# Patient Record
Sex: Female | Born: 1945
Health system: Southern US, Community
[De-identification: ages and names within clinical notes are randomized; demographics above are authoritative.]

## PROBLEM LIST (undated history)

## (undated) DIAGNOSIS — F419 Anxiety disorder, unspecified: Secondary | ICD-10-CM

## (undated) DIAGNOSIS — E7800 Pure hypercholesterolemia, unspecified: Secondary | ICD-10-CM

## (undated) DIAGNOSIS — I10 Essential (primary) hypertension: Secondary | ICD-10-CM

## (undated) DIAGNOSIS — R011 Cardiac murmur, unspecified: Secondary | ICD-10-CM

## (undated) DIAGNOSIS — E119 Type 2 diabetes mellitus without complications: Secondary | ICD-10-CM

## (undated) DIAGNOSIS — R7303 Prediabetes: Secondary | ICD-10-CM

## (undated) DIAGNOSIS — R002 Palpitations: Secondary | ICD-10-CM

## (undated) DIAGNOSIS — K589 Irritable bowel syndrome without diarrhea: Secondary | ICD-10-CM

## (undated) DIAGNOSIS — K219 Gastro-esophageal reflux disease without esophagitis: Secondary | ICD-10-CM

## (undated) HISTORY — DX: Irritable bowel syndrome, unspecified: K58.9

## (undated) HISTORY — DX: Cardiac murmur, unspecified: R01.1

## (undated) HISTORY — DX: Pure hypercholesterolemia, unspecified: E78.00

## (undated) HISTORY — DX: Palpitations: R00.2

## (undated) HISTORY — DX: Anxiety disorder, unspecified: F41.9

## (undated) HISTORY — PX: BREAST SURGERY: SHX581

## (undated) HISTORY — PX: CHOLECYSTECTOMY: SHX55

## (undated) HISTORY — DX: Type 2 diabetes mellitus without complications: E11.9

## (undated) HISTORY — DX: Essential (primary) hypertension: I10

## (undated) HISTORY — PX: TUBAL LIGATION: SHX77

## (undated) HISTORY — PX: TONSILLECTOMY: SHX5217

## (undated) HISTORY — PX: HERNIA REPAIR: SHX51

## (undated) HISTORY — DX: Prediabetes: R73.03

## (undated) HISTORY — PX: COLONOSCOPY: SHX174

## (undated) HISTORY — DX: Gastro-esophageal reflux disease without esophagitis: K21.9

---

## 1990-07-30 HISTORY — PX: ABDOMINAL HYSTERECTOMY: SHX81

## 1994-07-30 HISTORY — PX: BILATERAL SALPINGOOPHORECTOMY: SHX1223

## 1998-01-11 ENCOUNTER — Other Ambulatory Visit: Admission: RE | Admit: 1998-01-11 | Discharge: 1998-01-11 | Payer: Self-pay | Admitting: *Deleted

## 1998-04-12 ENCOUNTER — Inpatient Hospital Stay (HOSPITAL_COMMUNITY): Admission: RE | Admit: 1998-04-12 | Discharge: 1998-04-13 | Payer: Self-pay | Admitting: *Deleted

## 1999-01-23 ENCOUNTER — Other Ambulatory Visit: Admission: RE | Admit: 1999-01-23 | Discharge: 1999-01-23 | Payer: Self-pay | Admitting: *Deleted

## 2000-01-22 ENCOUNTER — Other Ambulatory Visit: Admission: RE | Admit: 2000-01-22 | Discharge: 2000-01-22 | Payer: Self-pay | Admitting: *Deleted

## 2000-03-21 ENCOUNTER — Encounter: Payer: Self-pay | Admitting: Surgery

## 2000-03-21 ENCOUNTER — Encounter: Admission: RE | Admit: 2000-03-21 | Discharge: 2000-03-21 | Payer: Self-pay | Admitting: Surgery

## 2000-03-27 ENCOUNTER — Encounter (INDEPENDENT_AMBULATORY_CARE_PROVIDER_SITE_OTHER): Payer: Self-pay | Admitting: Specialist

## 2000-03-27 ENCOUNTER — Observation Stay (HOSPITAL_COMMUNITY): Admission: RE | Admit: 2000-03-27 | Discharge: 2000-03-28 | Payer: Self-pay | Admitting: Surgery

## 2000-03-27 ENCOUNTER — Encounter: Payer: Self-pay | Admitting: Surgery

## 2002-07-13 ENCOUNTER — Other Ambulatory Visit: Admission: RE | Admit: 2002-07-13 | Discharge: 2002-07-13 | Payer: Self-pay | Admitting: *Deleted

## 2003-05-20 ENCOUNTER — Encounter: Payer: Self-pay | Admitting: Internal Medicine

## 2003-10-25 ENCOUNTER — Other Ambulatory Visit: Admission: RE | Admit: 2003-10-25 | Discharge: 2003-10-25 | Payer: Self-pay | Admitting: *Deleted

## 2004-07-13 ENCOUNTER — Ambulatory Visit: Payer: Self-pay | Admitting: Internal Medicine

## 2004-07-18 ENCOUNTER — Ambulatory Visit: Payer: Self-pay | Admitting: Internal Medicine

## 2004-07-27 ENCOUNTER — Ambulatory Visit: Payer: Self-pay | Admitting: Internal Medicine

## 2004-08-11 ENCOUNTER — Ambulatory Visit: Payer: Self-pay | Admitting: Internal Medicine

## 2004-11-28 ENCOUNTER — Ambulatory Visit: Payer: Self-pay | Admitting: Internal Medicine

## 2005-01-09 ENCOUNTER — Ambulatory Visit: Payer: Self-pay | Admitting: Internal Medicine

## 2005-09-19 ENCOUNTER — Ambulatory Visit: Payer: Self-pay | Admitting: Internal Medicine

## 2006-07-05 ENCOUNTER — Ambulatory Visit: Payer: Self-pay | Admitting: Internal Medicine

## 2006-07-11 ENCOUNTER — Ambulatory Visit: Payer: Self-pay | Admitting: Internal Medicine

## 2007-02-04 ENCOUNTER — Telehealth (INDEPENDENT_AMBULATORY_CARE_PROVIDER_SITE_OTHER): Payer: Self-pay | Admitting: *Deleted

## 2007-03-24 ENCOUNTER — Ambulatory Visit: Payer: Self-pay | Admitting: Internal Medicine

## 2007-03-24 DIAGNOSIS — I10 Essential (primary) hypertension: Secondary | ICD-10-CM | POA: Insufficient documentation

## 2007-03-24 DIAGNOSIS — E785 Hyperlipidemia, unspecified: Secondary | ICD-10-CM | POA: Insufficient documentation

## 2007-03-31 LAB — CONVERTED CEMR LAB
ALT: 60 units/L — ABNORMAL HIGH (ref 0–35)
AST: 69 units/L — ABNORMAL HIGH (ref 0–37)
Albumin: 3.9 g/dL (ref 3.5–5.2)
Alkaline Phosphatase: 71 units/L (ref 39–117)
BUN: 10 mg/dL (ref 6–23)
Basophils Absolute: 0 10*3/uL (ref 0.0–0.1)
Basophils Relative: 0.3 % (ref 0.0–1.0)
Bilirubin, Direct: 0.1 mg/dL (ref 0.0–0.3)
CO2: 28 meq/L (ref 19–32)
Calcium: 9.3 mg/dL (ref 8.4–10.5)
Chloride: 108 meq/L (ref 96–112)
Cholesterol: 126 mg/dL (ref 0–200)
Creatinine, Ser: 0.9 mg/dL (ref 0.4–1.2)
Eosinophils Absolute: 0.1 10*3/uL (ref 0.0–0.6)
Eosinophils Relative: 1.7 % (ref 0.0–5.0)
GFR calc Af Amer: 82 mL/min
GFR calc non Af Amer: 68 mL/min
Glucose, Bld: 108 mg/dL — ABNORMAL HIGH (ref 70–99)
HCT: 43 % (ref 36.0–46.0)
HDL: 46.3 mg/dL (ref >39.0)
Hemoglobin: 14.8 g/dL (ref 12.0–15.0)
Hgb A1c MFr Bld: 6.1 % — ABNORMAL HIGH (ref 4.6–6.0)
LDL Cholesterol: 61 mg/dL (ref 0–99)
Lymphocytes Relative: 29.7 % (ref 12.0–46.0)
MCHC: 34.4 g/dL (ref 30.0–36.0)
MCV: 89.9 fL (ref 78.0–100.0)
Monocytes Absolute: 0.5 10*3/uL (ref 0.2–0.7)
Monocytes Relative: 6.5 % (ref 3.0–11.0)
Neutro Abs: 5.1 10*3/uL (ref 1.4–7.7)
Neutrophils Relative %: 61.8 % (ref 43.0–77.0)
Platelets: 346 10*3/uL (ref 150–400)
Potassium: 4 meq/L (ref 3.5–5.1)
RBC: 4.78 M/uL (ref 3.87–5.11)
RDW: 13 % (ref 11.5–14.6)
Sodium: 143 meq/L (ref 135–145)
TSH: 0.4 microintl units/mL (ref 0.35–5.50)
Total Bilirubin: 1.8 mg/dL — ABNORMAL HIGH (ref 0.3–1.2)
Total CHOL/HDL Ratio: 2.7
Total Protein: 7 g/dL (ref 6.0–8.3)
Triglycerides: 93 mg/dL (ref 0–149)
VLDL: 19 mg/dL (ref 0–40)
WBC: 8.1 10*3/uL (ref 4.5–10.5)

## 2007-04-01 ENCOUNTER — Encounter (INDEPENDENT_AMBULATORY_CARE_PROVIDER_SITE_OTHER): Payer: Self-pay | Admitting: *Deleted

## 2007-04-01 ENCOUNTER — Encounter: Payer: Self-pay | Admitting: Internal Medicine

## 2007-04-14 ENCOUNTER — Encounter (INDEPENDENT_AMBULATORY_CARE_PROVIDER_SITE_OTHER): Payer: Self-pay | Admitting: *Deleted

## 2007-04-14 ENCOUNTER — Ambulatory Visit: Payer: Self-pay | Admitting: Internal Medicine

## 2007-04-15 ENCOUNTER — Encounter (INDEPENDENT_AMBULATORY_CARE_PROVIDER_SITE_OTHER): Payer: Self-pay | Admitting: *Deleted

## 2007-11-18 ENCOUNTER — Telehealth (INDEPENDENT_AMBULATORY_CARE_PROVIDER_SITE_OTHER): Payer: Self-pay | Admitting: *Deleted

## 2008-03-30 ENCOUNTER — Telehealth (INDEPENDENT_AMBULATORY_CARE_PROVIDER_SITE_OTHER): Payer: Self-pay | Admitting: *Deleted

## 2008-04-20 ENCOUNTER — Ambulatory Visit: Payer: Self-pay | Admitting: Internal Medicine

## 2008-04-21 ENCOUNTER — Encounter (INDEPENDENT_AMBULATORY_CARE_PROVIDER_SITE_OTHER): Payer: Self-pay | Admitting: *Deleted

## 2008-04-21 ENCOUNTER — Telehealth (INDEPENDENT_AMBULATORY_CARE_PROVIDER_SITE_OTHER): Payer: Self-pay | Admitting: *Deleted

## 2008-04-22 ENCOUNTER — Encounter (INDEPENDENT_AMBULATORY_CARE_PROVIDER_SITE_OTHER): Payer: Self-pay | Admitting: *Deleted

## 2008-08-24 ENCOUNTER — Ambulatory Visit: Payer: Self-pay | Admitting: Internal Medicine

## 2008-08-29 LAB — CONVERTED CEMR LAB
ALT: 38 units/L — ABNORMAL HIGH (ref 0–35)
AST: 36 units/L (ref 0–37)
Albumin: 3.5 g/dL (ref 3.5–5.2)
Alkaline Phosphatase: 60 units/L (ref 39–117)
Bilirubin, Direct: 0.1 mg/dL (ref 0.0–0.3)
Hgb A1c MFr Bld: 6.2 % — ABNORMAL HIGH (ref 4.6–6.0)
Total Bilirubin: 1.1 mg/dL (ref 0.3–1.2)
Total Protein: 7 g/dL (ref 6.0–8.3)

## 2008-08-31 ENCOUNTER — Encounter: Payer: Self-pay | Admitting: Internal Medicine

## 2009-02-08 ENCOUNTER — Telehealth (INDEPENDENT_AMBULATORY_CARE_PROVIDER_SITE_OTHER): Payer: Self-pay | Admitting: *Deleted

## 2009-03-23 ENCOUNTER — Ambulatory Visit: Payer: Self-pay | Admitting: Internal Medicine

## 2009-03-30 ENCOUNTER — Encounter: Payer: Self-pay | Admitting: Internal Medicine

## 2009-04-05 LAB — CONVERTED CEMR LAB
ALT: 78 units/L — ABNORMAL HIGH (ref 0–35)
AST: 75 units/L — ABNORMAL HIGH (ref 0–37)
Hgb A1c MFr Bld: 6.2 % (ref 4.6–6.5)

## 2009-04-21 ENCOUNTER — Ambulatory Visit: Payer: Self-pay | Admitting: Internal Medicine

## 2009-04-21 DIAGNOSIS — R740 Nonspecific elevation of levels of transaminase and lactic acid dehydrogenase [LDH]: Secondary | ICD-10-CM

## 2009-04-21 DIAGNOSIS — N959 Unspecified menopausal and perimenopausal disorder: Secondary | ICD-10-CM | POA: Insufficient documentation

## 2009-04-21 DIAGNOSIS — E119 Type 2 diabetes mellitus without complications: Secondary | ICD-10-CM | POA: Insufficient documentation

## 2009-04-21 DIAGNOSIS — R7401 Elevation of levels of liver transaminase levels: Secondary | ICD-10-CM | POA: Insufficient documentation

## 2009-04-26 ENCOUNTER — Telehealth (INDEPENDENT_AMBULATORY_CARE_PROVIDER_SITE_OTHER): Payer: Self-pay | Admitting: *Deleted

## 2009-04-26 ENCOUNTER — Encounter (INDEPENDENT_AMBULATORY_CARE_PROVIDER_SITE_OTHER): Payer: Self-pay | Admitting: *Deleted

## 2009-05-18 ENCOUNTER — Telehealth (INDEPENDENT_AMBULATORY_CARE_PROVIDER_SITE_OTHER): Payer: Self-pay | Admitting: *Deleted

## 2009-06-01 ENCOUNTER — Ambulatory Visit: Payer: Self-pay | Admitting: Internal Medicine

## 2009-06-06 ENCOUNTER — Encounter (INDEPENDENT_AMBULATORY_CARE_PROVIDER_SITE_OTHER): Payer: Self-pay | Admitting: *Deleted

## 2009-06-06 LAB — CONVERTED CEMR LAB
ALT: 53 units/L — ABNORMAL HIGH (ref 0–35)
AST: 52 units/L — ABNORMAL HIGH (ref 0–37)
Albumin: 3.6 g/dL (ref 3.5–5.2)
Alkaline Phosphatase: 64 units/L (ref 39–117)
Bilirubin, Direct: 0 mg/dL (ref 0.0–0.3)
Total Bilirubin: 1.2 mg/dL (ref 0.3–1.2)
Total Protein: 7.3 g/dL (ref 6.0–8.3)

## 2009-06-10 ENCOUNTER — Ambulatory Visit: Payer: Self-pay | Admitting: Internal Medicine

## 2009-06-30 ENCOUNTER — Encounter: Payer: Self-pay | Admitting: Internal Medicine

## 2010-04-04 ENCOUNTER — Encounter: Payer: Self-pay | Admitting: Internal Medicine

## 2010-04-26 ENCOUNTER — Ambulatory Visit: Payer: Self-pay | Admitting: Internal Medicine

## 2010-04-26 ENCOUNTER — Encounter: Payer: Self-pay | Admitting: Internal Medicine

## 2010-04-26 DIAGNOSIS — R7309 Other abnormal glucose: Secondary | ICD-10-CM | POA: Insufficient documentation

## 2010-04-26 DIAGNOSIS — K219 Gastro-esophageal reflux disease without esophagitis: Secondary | ICD-10-CM | POA: Insufficient documentation

## 2010-04-26 DIAGNOSIS — F411 Generalized anxiety disorder: Secondary | ICD-10-CM | POA: Insufficient documentation

## 2010-05-01 LAB — CONVERTED CEMR LAB
ALT: 63 units/L — ABNORMAL HIGH (ref 0–35)
AST: 63 units/L — ABNORMAL HIGH (ref 0–37)
Albumin: 3.8 g/dL (ref 3.5–5.2)
Eosinophils Relative: 2.6 % (ref 0.0–5.0)
GFR calc non Af Amer: 63.21 mL/min (ref >60)
HCT: 43.1 % (ref 36.0–46.0)
HDL: 57 mg/dL (ref >39.00)
Hemoglobin: 14.7 g/dL (ref 12.0–15.0)
Lymphs Abs: 3 10*3/uL (ref 0.7–4.0)
Microalb Creat Ratio: 0.6 mg/g (ref 0.0–30.0)
Monocytes Relative: 6.5 % (ref 3.0–12.0)
Neutro Abs: 4.8 10*3/uL (ref 1.4–7.7)
Potassium: 5.1 meq/L (ref 3.5–5.1)
Sodium: 142 meq/L (ref 135–145)
TSH: 0.23 microintl units/mL — ABNORMAL LOW (ref 0.35–5.50)
Total Bilirubin: 1.4 mg/dL — ABNORMAL HIGH (ref 0.3–1.2)
VLDL: 27.2 mg/dL (ref 0.0–40.0)
WBC: 8.6 10*3/uL (ref 4.5–10.5)

## 2010-08-23 ENCOUNTER — Ambulatory Visit
Admission: RE | Admit: 2010-08-23 | Discharge: 2010-08-23 | Payer: Self-pay | Source: Home / Self Care | Attending: Internal Medicine | Admitting: Internal Medicine

## 2010-08-23 ENCOUNTER — Encounter: Payer: Self-pay | Admitting: Internal Medicine

## 2010-08-27 LAB — CONVERTED CEMR LAB
ALT: 46 units/L — ABNORMAL HIGH (ref 0–35)
ALT: 84 units/L — ABNORMAL HIGH (ref 0–35)
AST: 101 units/L — ABNORMAL HIGH (ref 0–37)
AST: 47 units/L — ABNORMAL HIGH (ref 0–37)
Albumin: 3.9 g/dL (ref 3.5–5.2)
Albumin: 3.9 g/dL (ref 3.5–5.2)
Alkaline Phosphatase: 66 units/L (ref 39–117)
Alkaline Phosphatase: 66 units/L (ref 39–117)
BUN: 11 mg/dL (ref 6–23)
BUN: 8 mg/dL (ref 6–23)
Basophils Absolute: 0.1 10*3/uL (ref 0.0–0.1)
Basophils Absolute: 0.1 10*3/uL (ref 0.0–0.1)
Basophils Relative: 0.6 % (ref 0.0–3.0)
Basophils Relative: 0.9 % (ref 0.0–3.0)
Bilirubin, Direct: 0.1 mg/dL (ref 0.0–0.3)
Bilirubin, Direct: 0.1 mg/dL (ref 0.0–0.3)
CO2: 29 meq/L (ref 19–32)
CO2: 30 meq/L (ref 19–32)
Calcium: 10 mg/dL (ref 8.4–10.5)
Calcium: 9.9 mg/dL (ref 8.4–10.5)
Chloride: 106 meq/L (ref 96–112)
Chloride: 108 meq/L (ref 96–112)
Cholesterol, target level: 200 mg/dL
Cholesterol: 181 mg/dL (ref 0–200)
Creatinine, Ser: 0.9 mg/dL (ref 0.4–1.2)
Creatinine, Ser: 1 mg/dL (ref 0.4–1.2)
Creatinine,U: 50.5 mg/dL
Eosinophils Absolute: 0.2 10*3/uL (ref 0.0–0.7)
Eosinophils Absolute: 0.2 10*3/uL (ref 0.0–0.7)
Eosinophils Relative: 2.1 % (ref 0.0–5.0)
Eosinophils Relative: 2.3 % (ref 0.0–5.0)
GFR calc Af Amer: 73 mL/min
GFR calc non Af Amer: 60 mL/min
GFR calc non Af Amer: 67.5 mL/min (ref >60)
Glucose, Bld: 100 mg/dL — ABNORMAL HIGH (ref 70–99)
Glucose, Bld: 92 mg/dL (ref 70–99)
HCT: 43.1 % (ref 36.0–46.0)
HCT: 43.3 % (ref 36.0–46.0)
HDL goal, serum: 50 mg/dL
HDL: 51.4 mg/dL (ref >39.0)
Hemoglobin: 14.6 g/dL (ref 12.0–15.0)
Hemoglobin: 14.9 g/dL (ref 12.0–15.0)
Hgb A1c MFr Bld: 6.2 % (ref 4.6–6.5)
Hgb A1c MFr Bld: 6.3 % — ABNORMAL HIGH (ref 4.6–6.0)
LDL Cholesterol: 98 mg/dL (ref 0–99)
LDL Goal: 110 mg/dL
Lymphocytes Relative: 28.9 % (ref 12.0–46.0)
Lymphocytes Relative: 33.7 % (ref 12.0–46.0)
Lymphs Abs: 2.6 10*3/uL (ref 0.7–4.0)
MCHC: 33.8 g/dL (ref 30.0–36.0)
MCHC: 34.5 g/dL (ref 30.0–36.0)
MCV: 91.7 fL (ref 78.0–100.0)
MCV: 92.6 fL (ref 78.0–100.0)
Microalb Creat Ratio: 4 mg/g (ref 0.0–30.0)
Microalb, Ur: 0.2 mg/dL (ref 0.0–1.9)
Monocytes Absolute: 0.4 10*3/uL (ref 0.1–1.0)
Monocytes Absolute: 0.6 10*3/uL (ref 0.1–1.0)
Monocytes Relative: 5.9 % (ref 3.0–12.0)
Monocytes Relative: 6.4 % (ref 3.0–12.0)
Neutro Abs: 4.3 10*3/uL (ref 1.4–7.7)
Neutro Abs: 5.5 10*3/uL (ref 1.4–7.7)
Neutrophils Relative %: 57.2 % (ref 43.0–77.0)
Neutrophils Relative %: 62 % (ref 43.0–77.0)
Platelets: 297 10*3/uL (ref 150.0–400.0)
Platelets: 321 10*3/uL (ref 150–400)
Potassium: 4.1 meq/L (ref 3.5–5.1)
Potassium: 4.6 meq/L (ref 3.5–5.1)
RBC: 4.65 M/uL (ref 3.87–5.11)
RBC: 4.72 M/uL (ref 3.87–5.11)
RDW: 12.9 % (ref 11.5–14.6)
RDW: 13 % (ref 11.5–14.6)
Sodium: 141 meq/L (ref 135–145)
Sodium: 141 meq/L (ref 135–145)
TSH: 0.63 microintl units/mL (ref 0.35–5.50)
TSH: 0.67 microintl units/mL (ref 0.35–5.50)
Total Bilirubin: 1.4 mg/dL — ABNORMAL HIGH (ref 0.3–1.2)
Total Bilirubin: 1.7 mg/dL — ABNORMAL HIGH (ref 0.3–1.2)
Total CHOL/HDL Ratio: 3.5
Total Protein: 7.1 g/dL (ref 6.0–8.3)
Total Protein: 7.2 g/dL (ref 6.0–8.3)
Triglycerides: 160 mg/dL — ABNORMAL HIGH (ref 0–149)
VLDL: 32 mg/dL (ref 0–40)
Vit D, 25-Hydroxy: 18 ng/mL — ABNORMAL LOW (ref 30–89)
WBC: 7.5 10*3/uL (ref 4.5–10.5)
WBC: 9 10*3/uL (ref 4.5–10.5)

## 2010-08-28 LAB — CONVERTED CEMR LAB
ALT: 26 units/L (ref 0–35)
AST: 29 units/L (ref 0–37)
Free T4: 1.13 ng/dL (ref 0.80–1.80)
Hgb A1c MFr Bld: 6.1 % — ABNORMAL HIGH (ref <5.7)

## 2010-08-29 ENCOUNTER — Telehealth: Payer: Self-pay | Admitting: Internal Medicine

## 2010-08-29 ENCOUNTER — Ambulatory Visit
Admission: RE | Admit: 2010-08-29 | Discharge: 2010-08-29 | Payer: Self-pay | Source: Home / Self Care | Attending: Internal Medicine | Admitting: Internal Medicine

## 2010-08-29 DIAGNOSIS — N6459 Other signs and symptoms in breast: Secondary | ICD-10-CM | POA: Insufficient documentation

## 2010-08-31 NOTE — Assessment & Plan Note (Signed)
Summary: CPX AND FASTING LABS///SPH   Vital Signs:  Patient profile:   65 year old female Height:      64.25 inches Weight:      172.4 pounds BMI:     29.47 Temp:     98.2 degrees F oral Pulse rate:   56 / minute Resp:     14 per minute BP sitting:   124 / 80  (left arm) Cuff size:   large  Vitals Entered By: Shonna Chock CMA (April 26, 2010 11:03 AM)    History of Present Illness:      Courtney Russell is here for a physical; she has had increased stress related to family health issues. Hypertension Follow-Up      This is a 65 year old woman who also  presents for Hypertension follow-up.  The patient denies lightheadedness, urinary frequency, headaches, and fatigue.  Associated symptoms include pedal edema.  The patient denies the following associated symptoms: chest pain, chest pressure, exercise intolerance, dyspnea, palpitations, syncope, and leg edema.  Compliance with medications (by patient report) has been near 100%.  The patient reports that dietary compliance has been fair.  The patient reports exercising occasionally.  Adjunctive measures currently used by the patient include  modiified salt restriction( no added salt @ table).       The patient also  has ERD.  The patient reports acid reflux, but denies sour taste in mouth, epigastric pain, trouble swallowing, weight loss, and weight gain.  The patient denies the following alarm features: melena, dysphagia, hematemesis, and vomiting.  Symptoms are worse with spicy foods, caffeine, & chocolate.  The patient has found the following treatments to be effective: a PPI & as needed TUMS.   Current Medications (verified): 1)  Clonidine Hcl 0.2 Mg  Tabs (Clonidine Hcl) .... 1/2 Tab Twice Daily 2)  Metoprolol Tartrate 100 Mg  Tabs (Metoprolol Tartrate) .... 1/2 Tab Bid 3)  Prilosec Otc 20 Mg  Tbec (Omeprazole Magnesium) .Marland Kitchen.. 1 By Mouth Prn 4)  Calcium and Vit D 5)  Freestyle Lancets  Misc (Lancets) .... As Directed 6)  Freestyle  Lite Test  Strp (Glucose Blood) .... Check Blood Sugars M,w,f, and Sun. and 2 Hours After Breakfast On Tue, Lunch On Thur,and Evening Meal On Sat 7)  Vitamin D (Ergocalciferol) 50000 Unit Caps (Ergocalciferol) .Marland Kitchen.. 1 Pill Once Weekly 8)  Osteo Bi-Flex Adv Joint Shield  Tabs (Misc Natural Products) .Marland Kitchen.. 1 By Mouth At Bedtime 9)  Fish Oil 1000 Mg Caps (Omega-3 Fatty Acids) .Marland Kitchen.. 1 By Mouth At Bedtime  Allergies: 1)  ! * Codiene 2)  ! Hydrochlorothiazide 3)  ! * Lisinipril 4)  ! * Fish Oil 5)  ! * Dixoral  Past History:  Past Medical History: Hyperlipidemia:NMR Lipoprofile  2010: LDL 161(0960/454), HDL 62, TG 150. LDL goal = <100, ideal = < 70. Hypertension Palpitations, PMH of "Premalignant" lesion RUE, Dr Stefanie Libel, [facility]; Elevated LFTs 02/2009; "Pre Diabetes" :A1c 6.2% in 2010  Past Surgical History: Cholecystectomy  Colonoscopy negative  1998& 2010 , Dr Juanda Chance) Hysterectomy  for fibroids ; Oophorectomy bilat for cysts 1998 Tonsillectomy Tubal ligation breast biopsy  Family History: Father: CAD,MI @ 57, CABG @ 70,DM,colon & prostate  cancer Mother: colon polyp,COAD,TIA Siblings: sister: breast cancer ; sister: stent @ 51  Social History: Occupation: Administration Scientist, water quality) Married Never Smoked Alcohol use-no  Review of Systems  The patient denies anorexia, fever, decreased hearing, hoarseness, prolonged cough, hematuria, suspicious skin lesions, unusual weight change, abnormal  bleeding, enlarged lymph nodes, and angioedema.   Psych:  Complains of anxiety, easily angered, and irritability; denies depression and easily tearful.  Physical Exam  General:  well-nourished,in no acute distress; alert,appropriate and cooperative throughout examination Head:  Normocephalic and atraumatic without obvious abnormalities.  Eyes:  No corneal or conjunctival inflammation noted. Marland Kitchen Perrla. Funduscopic exam benign, without hemorrhages, exudates or papilledema. Ears:  External ear  exam shows no significant lesions or deformities.  Otoscopic examination reveals clear canals, tympanic membranes are intact bilaterally without bulging, retraction, inflammation or discharge. Hearing is grossly normal bilaterally. Nose:  External nasal examination shows no deformity or inflammation. Nasal mucosa are pink and moist without lesions or exudates. Mouth:  Oral mucosa and oropharynx without lesions or exudates.  Teeth in good repair. Neck:  No deformities, masses, or tenderness noted. Lungs:  Normal respiratory effort, chest expands symmetrically. Lungs are clear to auscultation, no crackles or wheezes. Heart:  normal rate, regular rhythm, no gallop, no rub, no JVD, no HJR, and grade 1 /6 systolic murmur LSB.   Abdomen:  Bowel sounds positive,abdomen soft and non-tender without masses, organomegaly or hernias noted. Genitalia:  Dr Theressa Millard Msk:  No deformity or scoliosis noted of thoracic or lumbar spine.   Pulses:  R and L carotid,radial,dorsalis pedis and posterior tibial pulses are full and equal bilaterally Extremities:  No clubbing, cyanosis, edema, or deformity noted with normal full range of motion of all joints.   Neurologic:  alert & oriented X3 and DTRs symmetrical and normal.   Skin:  Intact without suspicious lesions or rashes Cervical Nodes:  No lymphadenopathy noted Axillary Nodes:  No palpable lymphadenopathy Psych:  memory intact for recent and remote, normally interactive, and good eye contact.     Impression & Recommendations:  Problem # 1:  ROUTINE GENERAL MEDICAL EXAM@HEALTH  CARE FACL (ICD-V70.0)  Orders: EKG w/ Interpretation (93000) Venipuncture (54098) TLB-Lipid Panel (80061-LIPID) TLB-BMP (Basic Metabolic Panel-BMET) (80048-METABOL) TLB-CBC Platelet - w/Differential (85025-CBCD) TLB-Hepatic/Liver Function Pnl (80076-HEPATIC) TLB-TSH (Thyroid Stimulating Hormone) (84443-TSH) TLB-A1C / Hgb A1C (Glycohemoglobin) (83036-A1C) TLB-Microalbumin/Creat Ratio,  Urine (82043-MALB)  Problem # 2:  HYPERTENSION, ESSENTIAL NOS (ICD-401.9)  Her updated medication list for this problem includes:    Clonidine Hcl 0.2 Mg Tabs (Clonidine hcl) .Marland Kitchen... 1/2 tab twice daily    Metoprolol Tartrate 100 Mg Tabs (Metoprolol tartrate) .Marland Kitchen... 1/2 tab bid  Problem # 3:  HYPERLIPIDEMIA (ICD-272.4)  Problem # 4:  PRE-DIABETES (ICD-790.29)  Problem # 5:  ANXIETY STATE, UNSPECIFIED (ICD-300.00)  Her updated medication list for this problem includes:    Citalopram Hydrobromide 20 Mg Tabs (Citalopram hydrobromide) .Marland Kitchen... 1 once daily    Lorazepam 0.5 Mg Tabs (Lorazepam) .Marland Kitchen... 1 every 8 hrs as needed stress  Problem # 6:  GERD (ICD-530.81)  Her updated medication list for this problem includes:    Prilosec Otc 20 Mg Tbec (Omeprazole magnesium) .Marland Kitchen... 1 by mouth prn  Complete Medication List: 1)  Clonidine Hcl 0.2 Mg Tabs (Clonidine hcl) .... 1/2 tab twice daily 2)  Metoprolol Tartrate 100 Mg Tabs (Metoprolol tartrate) .... 1/2 tab bid 3)  Prilosec Otc 20 Mg Tbec (Omeprazole magnesium) .Marland Kitchen.. 1 by mouth prn 4)  Calcium and Vit D  5)  Freestyle Lancets Misc (Lancets) .... As directed 6)  Freestyle Lite Test Strp (Glucose blood) .... Check blood sugars m,w,f, and sun. and 2 hours after breakfast on tue, lunch on thur,and evening meal on sat 7)  Vitamin D (ergocalciferol) 50000 Unit Caps (Ergocalciferol) .Marland Kitchen.. 1 pill once weekly  8)  Osteo Bi-flex Adv Joint Shield Tabs (Misc natural products) .Marland Kitchen.. 1 by mouth at bedtime 9)  Fish Oil 1000 Mg Caps (Omega-3 fatty acids) .Marland Kitchen.. 1 by mouth at bedtime 10)  Citalopram Hydrobromide 20 Mg Tabs (Citalopram hydrobromide) .Marland Kitchen.. 1 once daily 11)  Lorazepam 0.5 Mg Tabs (Lorazepam) .Marland Kitchen.. 1 every 8 hrs as needed stress  Patient Instructions: 1)  Check your blood sugars regularly. If your readings are usually above :150 or below 90 you should contact our office. 2)  See your eye doctor yearly to check for diabetic eye damage. 3)  Check your feet  each night for sore areas, calluses or signs of infection. 4)  Check your Blood Pressure regularly. If it is above:135/85 ON AVERAGE  you should make an appointment. 5)  Avoid foods high in acid (tomatoes, citrus juices, spicy foods). Avoid eating within two hours of lying down or before exercising. Do not over eat; try smaller more frequent meals. Elevate head of bed twelve inches when sleeping. Prescriptions: LORAZEPAM 0.5 MG TABS (LORAZEPAM) 1 every 8 hrs as needed stress  #30 x 2   Entered and Authorized by:   Marga Melnick MD   Signed by:   Marga Melnick MD on 04/26/2010   Method used:   Print then Give to Patient   RxID:   [id number] CITALOPRAM HYDROBROMIDE 20 MG TABS (CITALOPRAM HYDROBROMIDE) 1 once daily  #30 x 5   Entered and Authorized by:   Marga Melnick MD   Signed by:   Marga Melnick MD on 04/26/2010   Method used:   Print then Give to Patient   RxID:   [id number] FREESTYLE LITE TEST  STRP (GLUCOSE BLOOD) Check blood sugars m,w,f, and sun. and 2 hours after breakfast on tue, lunch on thur,and evening meal on sat  #100 x 3   Entered and Authorized by:   Marga Melnick MD   Signed by:   Marga Melnick MD on 04/26/2010   Method used:   Print then Give to Patient   RxID:   [id number] FREESTYLE LANCETS  MISC (LANCETS) as directed  #100 x 3   Entered and Authorized by:   Marga Melnick MD   Signed by:   Marga Melnick MD on 04/26/2010   Method used:   Print then Give to Patient   RxID:   [id number] METOPROLOL TARTRATE 100 MG  TABS (METOPROLOL TARTRATE) 1/2 tab bid  #90 x 3   Entered and Authorized by:   Marga Melnick MD   Signed by:   Marga Melnick MD on 04/26/2010   Method used:   Print then Give to Patient   RxID:   [id number] CLONIDINE HCL 0.2 MG  TABS (CLONIDINE HCL) 1/2 tab twice daily  #90 x 3   Entered and Authorized by:   Marga Melnick MD   Signed by:   Marga Melnick MD on 04/26/2010   Method used:   Print then Give to Patient    RxID:   [id number]

## 2010-08-31 NOTE — Miscellaneous (Signed)
Summary: Orders Update  Clinical Lists Changes  Orders: Added new Test order of T-NMR, Lipoprofile [phone]) - Signed

## 2010-09-06 NOTE — Assessment & Plan Note (Signed)
Summary: F/U on labs/scm   Vital Signs:  Patient profile:   65 year old female Weight:      172.4 pounds BMI:     29.47 Temp:     98.0 degrees F oral Pulse rate:   60 / minute Resp:     14 per minute BP sitting:   132 / 80  (left arm) Cuff size:   large  Vitals Entered By: Shonna Chock CMA (August 29, 2010 3:53 PM) CC: Follow-up visit: discuss labs (copy given), Lipid Management   CC:  Follow-up visit: discuss labs (copy given) and Lipid Management.  History of Present Illness:    Onset of scant clear drainage from L breast intermittentlky since mid Dec 2011. Mammoagram in 09/11 was negative. She saw reference to underactive thyroid causing this on Internet.  She is not on Spironolactone.See Endocrine ROS review.     She came off statin due to elevated LFTs. Repeat lipids reveal LDL 146 & LFTs are normal.  A1c is now 6.1% w/o meds. The patient denies the following symptoms: chest pain/pressure, exercise intolerance, dypsnea, and pedal edema.  Dietary compliance has been fair.  The patient reports no exercise.  Adjunctive measures currently used by the patient include ASA.    Lipid Management History:      Positive NCEP/ATP III risk factors include female age 63 years old or older, diabetes, and hypertension.  Negative NCEP/ATP III risk factors include no history of early menopause without estrogen hormone replacement, no family history for ischemic heart disease, non-tobacco-user status, no ASHD (atherosclerotic heart disease), no prior stroke/TIA, no peripheral vascular disease, and no history of aortic aneurysm.     Current Medications (verified): 1)  Clonidine Hcl 0.2 Mg  Tabs (Clonidine Hcl) .... 1/2 Tab Twice Daily 2)  Metoprolol Tartrate 100 Mg  Tabs (Metoprolol Tartrate) .... 1/2 Tab Bid 3)  Prilosec Otc 20 Mg  Tbec (Omeprazole Magnesium) .Marland Kitchen.. 1 By Mouth Prn 4)  Calcium and Vit D 5)  Freestyle Lancets  Misc (Lancets) .... As Directed 6)  Freestyle Lite Test  Strp (Glucose  Blood) .... Check Blood Sugars M,w,f, and Sun. and 2 Hours After Breakfast On Tue, Lunch On Thur,and Evening Meal On Sat 7)  Vitamin D (Ergocalciferol) 50000 Unit Caps (Ergocalciferol) .Marland Kitchen.. 1 Pill Once Weekly 8)  Lorazepam 0.5 Mg Tabs (Lorazepam) .Marland Kitchen.. 1 Every 8 Hrs As Needed Stress  Allergies: 1)  ! * Codiene 2)  ! Hydrochlorothiazide 3)  ! * Lisinipril 4)  ! * Fish Oil 5)  ! * Dixoral  Past History:  Past Medical History: Hyperlipidemia:NMR Lipoprofile  2010: LDL 105 ([address]), HDL 62, TG 150. LDL goal = <100, ideal = < 70. A1c 6.1% ( 08/23/2010). No premature MI in FH. Elevated LFTs on statin; LFTs normal off statin . Rash with fish oil. Hypertension Palpitations, PMH of "Premalignant" lesion RUE, Dr Stefanie Libel, [facility]; Elevated LFTs 02/2009; "Pre Diabetes" :A1c 6.2% in 2010  Review of Systems General:  Denies fatigue and weight loss. Eyes:  Denies blurring, double vision, and vision loss-both eyes. ENT:  Denies difficulty swallowing, ear discharge, and hoarseness; 3 days L earache; dental extraction 3 months ago. CV:  Denies palpitations. GI:  Denies constipation and diarrhea. Derm:  Denies changes in nail beds, dryness, and hair loss. Neuro:  Denies numbness and tingling. Endo:  Complains of heat intolerance; denies cold intolerance.  Physical Exam  General:  well-nourished,in no acute distress; alert,appropriate and cooperative throughout examination Eyes:  No corneal  or conjunctival inflammation noted. EOMI. Perrla. Field of  Vision grossly normal. Ears:  External ear exam shows no significant lesions or deformities.  Otoscopic examination reveals clear canals, tympanic membranes are intact bilaterally without bulging, retraction, inflammation or discharge. Hearing is grossly normal bilaterally. Neck:  No deformities, masses, or tenderness noted. Asymmetry , R > L w/o nodules Lungs:  Normal respiratory effort, chest expands symmetrically. Lungs are clear to auscultation,  no crackles or wheezes. Heart:  normal rate, regular rhythm, no gallop, no rub, no JVD, and grade 1/2 /6 systolic murmur.   Pulses:  R and L carotid,radial,dorsalis pedis and posterior tibial pulses are full and equal bilaterally Extremities:  No clubbing, cyanosis, edema. No onycholysis Neurologic:  alert & oriented X3 and DTRs symmetrical and normal.   No tremor Skin:  Intact without suspicious lesions or rashes Cervical Nodes:  No lymphadenopathy noted Axillary Nodes:  No palpable lymphadenopathy Psych:  memory intact for recent and remote, normally interactive, and good eye contact.     Impression & Recommendations:  Problem # 1:  NIPPLE DISCHARGE (ICD-611.79)  Problem # 2:  NEOPLASM, MALIGNANT, BREAST, FAMILY HX, SIBLING (ICD-V16.3) Sister  Problem # 3:  HYPERLIPIDEMIA (ICD-272.4) LDL up off statin  Problem # 4:  NONSPEC ELEVATION OF LEVELS OF TRANSAMINASE/LDH (ICD-790.4) resolved off statin  Problem # 5:  PRE-DIABETES (ICD-790.29) A1c 6.1%  Complete Medication List: 1)  Clonidine Hcl 0.2 Mg Tabs (Clonidine hcl) .... 1/2 tab twice daily 2)  Metoprolol Tartrate 100 Mg Tabs (Metoprolol tartrate) .... 1/2 tab bid 3)  Prilosec Otc 20 Mg Tbec (Omeprazole magnesium) .Marland Kitchen.. 1 by mouth prn 4)  Calcium and Vit D  5)  Freestyle Lancets Misc (Lancets) .... As directed 6)  Freestyle Lite Test Strp (Glucose blood) .... Check blood sugars m,w,f, and sun. and 2 hours after breakfast on tue, lunch on thur,and evening meal on sat 7)  Vitamin D (ergocalciferol) 50000 Unit Caps (Ergocalciferol) .Marland Kitchen.. 1 pill once weekly 8)  Lorazepam 0.5 Mg Tabs (Lorazepam) .Marland Kitchen.. 1 every 8 hrs as needed stress  Lipid Assessment/Plan:      Based on NCEP/ATP III, the patient's risk factor category is "history of diabetes".  The patient's lipid goals are as follows: Total cholesterol goal is 200; LDL cholesterol goal is 110; HDL cholesterol goal is 50; Triglyceride goal is 150.  Her LDL cholesterol goal has been met.     Patient Instructions: 1)  Please schedule fasting advanced Lipid Panel  ( [facility], 1304X) to assess long term cardiovascular  risks & options & Diabetes risk.See Diagnoses for Codes. Establish with a Theatre manager.   Orders Added: 1)  Est. Patient Level IV [04540]

## 2010-09-06 NOTE — Progress Notes (Signed)
Summary: REFERRAL  Phone Note Call from Patient Call back at [phone]   Caller: Patient Reason for Call: Referral Summary of Call: PT IS CALLING WANTING A REFERRAL TO [facility] TO HAVE A MAMMO DONE FOR DISCHARGE FROM HER BREAST. SHE CALL THEM AND THEY TOLD HER SHE NEED A REFERRAL FROM THE DOC TO HAVE THIS DONE. Initial call taken by: Freddy Jaksch,  August 29, 2010 10:10 AM  Follow-up for Phone Call        Patient with discharge from left breast since 06/2010 (right before christmas). Discharge off/on (not daily), patient did not notice today. Discharge is clear.   Patient stated that she pulled up on the internet and seen this could  come from a thyroid issue Follow-up by: Shonna Chock CMA,  August 29, 2010 10:18 AM  Additional Follow-up for Phone Call Additional follow up Details #1::        I'll order a mammogram ; she should see her Gyn. Thyroid issues not high on list of causes Additional Follow-up by: Marga Melnick MD,  August 29, 2010 12:58 PM  New Problems: NIPPLE DISCHARGE [phone])   Additional Follow-up for Phone Call Additional follow up Details #2::    Patient informed @ OV today, patient states her GYN is deceased and she needs to find a new one./Chrae [facility] CMA  August 29, 2010 3:55 PM   NEW GYN REFERRAL? Magdalen Spatz [facility]  August 30, 2010 8:11 AM  No, patient's do not usually need a referral to see a GYN, she can call and set up herself./Chrae [facility] CMA  August 30, 2010 8:23 AM    Additional Follow-up for Phone Call Additional follow up Details #3:: Details for Additional Follow-up Action Taken: I s/w patient who is aware Yolanda Bonine will be calling her to set up Diag MMG/US, also patient calling to set up appt with her daughter's gyn. Magdalen Spatz [facility]  August 30, 2010 8:48 AM   New Problems: NIPPLE DISCHARGE [phone])

## 2010-09-07 ENCOUNTER — Encounter: Payer: Self-pay | Admitting: Internal Medicine

## 2010-09-12 ENCOUNTER — Encounter: Payer: Self-pay | Admitting: Internal Medicine

## 2010-09-25 ENCOUNTER — Encounter: Payer: Self-pay | Admitting: Internal Medicine

## 2010-10-06 ENCOUNTER — Encounter: Payer: Self-pay | Admitting: Internal Medicine

## 2010-10-17 NOTE — Letter (Signed)
Summary: [facility]   Imported By: Maryln Gottron 10/12/2010 10:07:52  _____________________________________________________________________  External Attachment:    Type:   Image     Comment:   External Document

## 2010-10-24 ENCOUNTER — Other Ambulatory Visit: Payer: Self-pay | Admitting: General Surgery

## 2010-10-24 ENCOUNTER — Ambulatory Visit
Admission: RE | Admit: 2010-10-24 | Discharge: 2010-10-24 | Disposition: A | Discharge status: Home / Self Care | Payer: BC Managed Care – PPO | Source: Ambulatory Visit | Attending: General Surgery | Admitting: General Surgery

## 2010-10-24 ENCOUNTER — Encounter (HOSPITAL_BASED_OUTPATIENT_CLINIC_OR_DEPARTMENT_OTHER)
Admission: RE | Admit: 2010-10-24 | Discharge: 2010-10-24 | Disposition: A | Discharge status: Home / Self Care | Payer: BC Managed Care – PPO | Source: Ambulatory Visit | Attending: General Surgery | Admitting: General Surgery

## 2010-10-24 DIAGNOSIS — Z01811 Encounter for preprocedural respiratory examination: Secondary | ICD-10-CM

## 2010-10-24 LAB — BASIC METABOLIC PANEL
BUN: 9 mg/dL (ref 6–23)
CO2: 29 mEq/L (ref 19–32)
Chloride: 105 mEq/L (ref 96–112)
Glucose, Bld: 108 mg/dL — ABNORMAL HIGH (ref 70–99)
Potassium: 4 mEq/L (ref 3.5–5.1)
Sodium: 140 mEq/L (ref 135–145)

## 2010-10-24 LAB — DIFFERENTIAL
Basophils Absolute: 0.1 10*3/uL (ref 0.0–0.1)
Eosinophils Relative: 3 % (ref 0–5)
Lymphocytes Relative: 43 % (ref 12–46)
Lymphs Abs: 4.3 10*3/uL — ABNORMAL HIGH (ref 0.7–4.0)
Neutro Abs: 4.4 10*3/uL (ref 1.7–7.7)

## 2010-10-24 LAB — CBC
HCT: 41.3 % (ref 36.0–46.0)
Hemoglobin: 14.3 g/dL (ref 12.0–15.0)
MCV: 88.1 fL (ref 78.0–100.0)
WBC: 10 10*3/uL (ref 4.0–10.5)

## 2010-10-26 ENCOUNTER — Other Ambulatory Visit: Payer: Self-pay | Admitting: General Surgery

## 2010-10-26 ENCOUNTER — Ambulatory Visit (HOSPITAL_BASED_OUTPATIENT_CLINIC_OR_DEPARTMENT_OTHER)
Admission: RE | Admit: 2010-10-26 | Discharge: 2010-10-26 | Disposition: A | Discharge status: Home / Self Care | Payer: BC Managed Care – PPO | Source: Ambulatory Visit | Attending: General Surgery | Admitting: General Surgery

## 2010-10-26 DIAGNOSIS — Z01818 Encounter for other preprocedural examination: Secondary | ICD-10-CM | POA: Insufficient documentation

## 2010-10-26 DIAGNOSIS — Z01812 Encounter for preprocedural laboratory examination: Secondary | ICD-10-CM | POA: Insufficient documentation

## 2010-10-26 DIAGNOSIS — K219 Gastro-esophageal reflux disease without esophagitis: Secondary | ICD-10-CM | POA: Insufficient documentation

## 2010-10-26 DIAGNOSIS — I10 Essential (primary) hypertension: Secondary | ICD-10-CM | POA: Insufficient documentation

## 2010-10-26 DIAGNOSIS — D249 Benign neoplasm of unspecified breast: Secondary | ICD-10-CM | POA: Insufficient documentation

## 2010-11-04 NOTE — Op Note (Signed)
  [name], Courtney Russell NO.:  [id number]  MEDICAL RECORD NO.:  [id number]            PATIENT TYPE:  LOCATION:                                 FACILITY:  PHYSICIAN:  Juanetta Gosling, MD     DATE OF BIRTH:  DATE OF PROCEDURE:  10/26/2010 DATE OF DISCHARGE:                              OPERATIVE REPORT   PREOPERATIVE DIAGNOSIS:  Left nipple discharge.  POSTOPERATIVE DIAGNOSIS:  Left nipple discharge.  PROCEDURE:  Left breast duct excision.  SURGEON:  Juanetta Gosling, MD.  ASSISTANT:  None.  ANESTHESIA:  General.  SPECIMENS:  Left breast tissue marked with long stitch lateral, short stitch superior, and double stitch deep.  ESTIMATED BLOOD LOSS:  Minimal.  COMPLICATIONS:  None.  DRAINS:  None.  DISPOSITION:  To recovery room in stable condition.  INDICATIONS:  This is a 65 year old female with spontaneous left nipple discharge for a couple of months prior to me seeing her in March.  She underwent diagnostic mammogram with some ductal ectasia seen in the 6 o'clock position.  She underwent a couple attempts at ductogram, eventually she underwent one that showed an ectatic duct approximately 3 cm posterior to the nipple orifice with an obstruction possibly consistent with a luminal mass.  I saw her, was able to identify the duct with discharge, and we discussed a directed duct excision.  DESCRIPTION OF PROCEDURE:  After informed consent was obtained, the patient was taken to the operating room.  She had sequential compression devices placed on her lower extremities prior to induction of anesthesia.  She was then placed under general esthesia with an LMA. Her left breast was then prepped and draped in standard sterile surgical fashion.  A surgical time-out was then performed.  I was able to again express the discharge from a single duct in about 3 o'clock to 4 o'clock position of the left nipple I cannulated, this was a lacrimal duct probe.  I  infiltrated 0.25% Marcaine throughout the lateral portion of the breast.  I then made a periareolar incision and carried this down into the breast tissue.  I then identified the duct.  I then used cautery to excise this entire ductal system in total.  This was then passed off the table after it was marked.  Hemostasis was then obtained. Irrigation was performed.  This was clear.  I then closed the breast tissue with 3- 0 Vicryl along with the dermis.  The skin was closed with 4-0 Monocryl. I  placed Dermabond over the wound.  She tolerated this well, was extubated, and transferred to recovery room in stable condition.     Juanetta Gosling, MD     MCW/MEDQ  D:  10/26/2010  T:  10/27/2010  Job:  [id number]  cc:   Jeralyn Ruths, MD Titus Dubin. Alwyn Ren, MD,FACP,FCCP  Electronically Signed by Emelia Loron MD on 11/04/2010 09:32:56 AM

## 2010-11-22 ENCOUNTER — Encounter (INDEPENDENT_AMBULATORY_CARE_PROVIDER_SITE_OTHER): Payer: Self-pay | Admitting: General Surgery

## 2010-11-22 DIAGNOSIS — K589 Irritable bowel syndrome without diarrhea: Secondary | ICD-10-CM

## 2010-11-22 DIAGNOSIS — J349 Unspecified disorder of nose and nasal sinuses: Secondary | ICD-10-CM

## 2010-11-24 ENCOUNTER — Encounter (INDEPENDENT_AMBULATORY_CARE_PROVIDER_SITE_OTHER): Payer: Self-pay | Admitting: General Surgery

## 2010-12-15 NOTE — Op Note (Signed)
[facility]  Patient:    LOYOLA, SANTINO POPE Visit Number: [id number] MRN: [id number]          Service Type: SUR Location: [id number] Attending Physician:  Charlton Haws Proc. Date: 03/27/00 Admit Date:  03/27/2000   CC:         Hedwig Morton. Juanda Chance, M.D. [facility]   Operative Report  PREOPERATIVE DIAGNOSIS:  Chronic calculus cholecystitis.  POSTOPERATIVE DIAGNOSIS:  Chronic calculus cholecystitis.  OPERATION:  Laparoscopic cholecystectomy with operative cholangiogram.  SURGEON:  Currie Paris, M.D.  ASSISTANT:  Gita Kudo, M.D.  ANESTHESIA:  General endotracheal.  INDICATIONS:  The patient is a 65 year old with some biliary tract symptoms and gallstones.  We decided to proceed to cholecystectomy.  She has some slight elevations in her liver functions, and it was decided to do operative cholangiography.  DESCRIPTION OF PROCEDURE:  The patient was brought to the operating room and after satisfactory general endotracheal anesthesia had been obtained, the abdomen was prepped and draped.  I used 0.25% plain Marcaine as I made each incision.  The umbilical incision was made first and the fascia entered.  The peritoneal cavity was entered.  Hasson was introduced and the abdomen insufflated to 15.  The patient was placed in reverse Trendelenburg.  Upon placing the camera in, I could see that there were omental adhesions and going just above umbilicus and including medial visualization of the right upper quadrant.  However, I was able to get into the left upper quadrant and go around the adhesions and visualize the right upper quadrant and falciform. A 10 mm port was placed in the epigastrium and because I could not well visualize laterally, I put the camera in the epigastric port and then the two 5 mm ports were then placed under direct vision.  The gallbladder had some omental adhesions on it which were taken down and the cystic duct  area dissected out.  I could see the cystic duct in its junction with the common duct of the gallbladder.  We dissected this out nicely.  I could see what appeared to be the cystic artery coming up off of the hepatic artery, and I clipped that one time.  I then clipped the cystic duct at its junction with the gallbladder.  The cystic duct was open but was fairly small and as we were manipulating it, it was divided completely into two.  Using forceps, I was able eventually to get enough tension on the cystic duct remnant to get a catheter into it for cholangiography and hold it in place with a clip. Operative cholangiography was done with Omnipaque.  She tolerated that nicely, and to me the cholangiogram looked normal with good filling in the duodenum. The holding clip was removed and the catheter removed and three clips placed on the cystic duct.  Further dissection gave me a little more room to work on the cystic artery, and it was clipped with four clips and then divided.  The gallbladder was removed from below to above with coagulation current electrocautery.  I went ahead and put it in the bag and then brought it out through the umbilical port.  I replaced the Hasson and then irrigated.  The bed appeared to be dry. There was a small tear of the liver from retraction that was about 1 cm in size and not bleeding right at the liver edge and may have actually been from the epigastric trocar.  Nevertheless, because that was present, I  put a 19 Blake drain in.  The remaining area was suctioned out. The remaining lateral port was removed under direct vision.  The drain was secured with a nylon.  The epigastric port was used to deflate the abdomen after the umbilical port was taken out and the pursestring tied down.  The skin was closed with 4-0 Monocryl subcuticular plus Steri-Strips.  The patient tolerated the procedure well.  There were no operative complications.  All counts were  correct. Attending Physician:  Charlton Haws DD:  03/27/00 TD:  03/27/00 Job: [id number]

## 2011-05-10 ENCOUNTER — Other Ambulatory Visit: Payer: Self-pay | Admitting: Internal Medicine

## 2011-06-04 ENCOUNTER — Encounter: Payer: Self-pay | Admitting: Internal Medicine

## 2011-06-05 ENCOUNTER — Encounter: Payer: Self-pay | Admitting: Internal Medicine

## 2011-06-05 ENCOUNTER — Ambulatory Visit (INDEPENDENT_AMBULATORY_CARE_PROVIDER_SITE_OTHER): Payer: BC Managed Care – PPO | Admitting: Internal Medicine

## 2011-06-05 VITALS — BP 122/86 | HR 53 | Temp 98.6°F | Resp 12 | Ht 65.0 in | Wt 170.4 lb

## 2011-06-05 DIAGNOSIS — E785 Hyperlipidemia, unspecified: Secondary | ICD-10-CM

## 2011-06-05 DIAGNOSIS — Z0000 Encounter for general adult medical examination without abnormal findings: Secondary | ICD-10-CM

## 2011-06-05 DIAGNOSIS — R9431 Abnormal electrocardiogram [ECG] [EKG]: Secondary | ICD-10-CM

## 2011-06-05 DIAGNOSIS — I10 Essential (primary) hypertension: Secondary | ICD-10-CM

## 2011-06-05 DIAGNOSIS — Z8249 Family history of ischemic heart disease and other diseases of the circulatory system: Secondary | ICD-10-CM

## 2011-06-05 DIAGNOSIS — E119 Type 2 diabetes mellitus without complications: Secondary | ICD-10-CM

## 2011-06-05 LAB — HEPATIC FUNCTION PANEL
AST: 29 U/L (ref 0–37)
Albumin: 3.7 g/dL (ref 3.5–5.2)

## 2011-06-05 LAB — CBC WITH DIFFERENTIAL/PLATELET
Eosinophils Absolute: 0.2 10*3/uL (ref 0.0–0.7)
Eosinophils Relative: 2.3 % (ref 0.0–5.0)
HCT: 43.3 % (ref 36.0–46.0)
Lymphs Abs: 2.8 10*3/uL (ref 0.7–4.0)
MCHC: 34.1 g/dL (ref 30.0–36.0)
MCV: 91.5 fl (ref 78.0–100.0)
Monocytes Absolute: 0.6 10*3/uL (ref 0.1–1.0)
Platelets: 285 10*3/uL (ref 150.0–400.0)
RDW: 13.7 % (ref 11.5–14.6)
WBC: 9.1 10*3/uL (ref 4.5–10.5)

## 2011-06-05 LAB — BASIC METABOLIC PANEL
BUN: 11 mg/dL (ref 6–23)
CO2: 25 mEq/L (ref 19–32)
Chloride: 110 mEq/L (ref 96–112)
Creatinine, Ser: 0.8 mg/dL (ref 0.4–1.2)
Glucose, Bld: 105 mg/dL — ABNORMAL HIGH (ref 70–99)
Potassium: 4.2 mEq/L (ref 3.5–5.1)

## 2011-06-05 LAB — MICROALBUMIN / CREATININE URINE RATIO
Microalb Creat Ratio: 0.6 mg/g (ref 0.0–30.0)
Microalb, Ur: 0.5 mg/dL (ref 0.0–1.9)

## 2011-06-05 LAB — HEMOGLOBIN A1C: Hgb A1c MFr Bld: 6.2 % (ref 4.6–6.5)

## 2011-06-05 LAB — TSH: TSH: 0.16 u[IU]/mL — ABNORMAL LOW (ref 0.35–5.50)

## 2011-06-05 NOTE — Progress Notes (Signed)
Subjective:    Patient ID: Courtney Russell, female    DOB: 29-Aug-1945, 65 y.o.   MRN: [id number]  HPI  Courtney Russell  is here for a physical;acute issues include respiratory tract symptoms      Review of Systems Respiratory tract infection Onset/symptoms:last night as NP cough & PNDrainage Exposures (illness/environmental/extrinsic):no Progression of symptoms:no change Treatments/response:Coricidin HP for cough with benefit Present symptoms: Fever/chills/sweats:no Frontal headache:no Facial pain: some Nasal purulence:no Sore throat:yes Dental pain:maxillary Lymphadenopathy:no Wheezing/shortness of breath:no Associated extrinsic/allergic symptoms:itchy eyes/ sneezing:no Past medical history: asthma:no Smoking history:never  She is not monitoring sugars on a regular basis and is unable to provide any range her average fasting blood sugars. She denies polyuria, polydipsia, or polyphagia. She denies numbness or tingling in extremities. She does have an annual ophthalmologic exam.            Objective:   Physical Exam Gen.: Healthy and well-nourished in appearance. Alert, appropriate and cooperative throughout exam. Head: Normocephalic without obvious abnormalities  Eyes: No corneal or conjunctival inflammation noted. Pupils equal round reactive to light and accommodation. Fundal exam is benign without hemorrhages, exudate, papilledema. Extraocular motion intact. Vision grossly normal. Ears: External  ear exam reveals no significant lesions or deformities. Canals clear .TMs normal. Hearing is grossly normal bilaterally. Nose: External nasal exam reveals no deformity or inflammation. Nasal mucosa are pink and moist. No lesions or exudates noted.  Mouth: Oral mucosa and oropharynx reveal no lesions or exudates. Teeth in good repair. Neck: No deformities, masses, or tenderness noted. Range of motion essentially normal. Thyroid  normal. Lungs: Normal respiratory effort; chest expands  symmetrically. Lungs are clear to auscultation without rales, wheezes, or increased work of breathing. Heart: Normal rate and rhythm. Normal S1 and S2. No gallop, click, or rub. Grade 1/2 -1 systolic  murmur. Abdomen: Bowel sounds normal; abdomen soft and nontender. No masses, organomegaly or hernias noted. Genitalia/ DRE: as per Gyn   .                                                                                   Musculoskeletal/extremities: No deformity or scoliosis noted of  the thoracic or lumbar spine. No clubbing, cyanosis, edema, or deformity noted. Range of motion  normal .Tone & strength  normal.Joints normal. Nail health  good. Vascular: Carotid, radial artery, dorsalis pedis and  posterior tibial pulses are full and equal. No bruits present. Neurologic: Alert and oriented x3. Deep tendon reflexes symmetrical and normal.Light touch normal over feet        Skin: Intact without suspicious lesions or rashes. Lymph: No cervical, axillary lymphadenopathy present. Psych: Mood and affect are normal. Normally interactive                                                                                         Assessment &  Plan:  #1 comprehensive physical exam; no acute findings #2 see Problem List with Assessments & Recommendations  #3 early upper stricture type symptoms without definitive bacterial process   #4 Progressive T changes laterally compared to 04/26/10. This is in the context of a strong family history of coronary disease. She has been off statins for a year due to elevated hepatic enzymes.  She had a nuclear stress test 10/30/2006 ; this was normal. Advanced cholesterol testing is indicated to assess this optimally in view of these findings.  Plan: see Orders

## 2011-06-05 NOTE — Patient Instructions (Signed)
Zicam Melts or Zinc lozenges ; vitamin C 2000 mg daily; & Echinacea for 4-7 days. Report fever, exudate("pus") or progressive pain.  

## 2011-06-08 ENCOUNTER — Telehealth: Payer: Self-pay | Admitting: Internal Medicine

## 2011-06-08 MED ORDER — AMOXICILLIN 500 MG PO TABS: 500.0000 mg | ORAL_TABLET | Freq: Three times a day (TID) | ORAL | Status: AC

## 2011-06-08 NOTE — Telephone Encounter (Signed)
Amoxicillin 500 mg one 3 times a day dispense # 30

## 2011-06-08 NOTE — Telephone Encounter (Signed)
Discuss with patient  

## 2011-06-08 NOTE — Telephone Encounter (Signed)
Pt still c/o sinus drainage and coughing up brownish mucous, and facial pain.Please advise

## 2011-06-29 ENCOUNTER — Other Ambulatory Visit: Payer: Self-pay | Admitting: Internal Medicine

## 2011-06-29 DIAGNOSIS — R946 Abnormal results of thyroid function studies: Secondary | ICD-10-CM

## 2011-07-02 ENCOUNTER — Other Ambulatory Visit (INDEPENDENT_AMBULATORY_CARE_PROVIDER_SITE_OTHER): Payer: BC Managed Care – PPO

## 2011-07-02 DIAGNOSIS — R946 Abnormal results of thyroid function studies: Secondary | ICD-10-CM

## 2011-07-02 NOTE — Progress Notes (Signed)
12  

## 2011-07-20 ENCOUNTER — Telehealth: Payer: Self-pay

## 2011-07-20 NOTE — Telephone Encounter (Signed)
Call from patient and she stated she was exposed to whooping cough by her Granddaughter and she wanted to know if we cough call in an Abx Per Dr. Alwyn Ren we do not treat whooping cough prophylactically, she would have to be addressed according to her symptoms. Patient said she did not understand since she was exposed, I advise patient this is the Dr's order and she voiced understanding      KP

## 2011-07-26 ENCOUNTER — Ambulatory Visit (INDEPENDENT_AMBULATORY_CARE_PROVIDER_SITE_OTHER): Payer: BC Managed Care – PPO | Admitting: Internal Medicine

## 2011-07-26 ENCOUNTER — Encounter: Payer: Self-pay | Admitting: Internal Medicine

## 2011-07-26 DIAGNOSIS — E785 Hyperlipidemia, unspecified: Secondary | ICD-10-CM

## 2011-07-26 DIAGNOSIS — I10 Essential (primary) hypertension: Secondary | ICD-10-CM

## 2011-07-26 DIAGNOSIS — E119 Type 2 diabetes mellitus without complications: Secondary | ICD-10-CM

## 2011-07-26 MED ORDER — METOPROLOL TARTRATE 100 MG PO TABS: ORAL_TABLET | ORAL | Status: DC

## 2011-07-26 MED ORDER — CLONIDINE HCL 0.2 MG PO TABS: ORAL_TABLET | ORAL | Status: DC

## 2011-07-26 NOTE — Patient Instructions (Signed)
Preventive Health Care: Exercise  30-45  minutes a day, 3-4 days a week. Walking is especially valuable in preventing Osteoporosis. Eye Doctor - have an eye exam @ least annually  Eat a low-fat diet with lots of fruits and vegetables, up to 7-9 servings per day. Consume less than 30 grams of sugar per day from foods & drinks with High Fructose Corn Syrup (HFCS) sugar as #1,2,3 or # 4 on label.Whole Foods, [employer] & [employer] do not carry products with HFCS. Follow a  low carb nutrition program such as [place] or The New Sugar Busters  to prevent Diabetes progression . White carbohydrates (potatoes, rice, bread, and pasta) have a high spike of sugar and a high load of sugar. For example a  baked potato has a cup of sugar and a  french fry  2 teaspoons of sugar. Yams, wild  rice, whole grained bread &  wheat pasta have been much lower spike and load of  sugar. Portions should be the size of a deck of cards or your palm.  Please  schedule fasting Labs in early- mid  Feb : Lipids, hepatic panel, A1c , urine microalbumin. PLEASE BRING THESE INSTRUCTIONS TO FOLLOW UP  LAB APPOINTMENT.This will guarantee correct labs are drawn, eliminating need for repeat blood sampling ( needle sticks ! ). Diagnoses /Codes: 272.4, 250.00

## 2011-07-26 NOTE — Progress Notes (Signed)
  Subjective:    Patient ID: Courtney Russell, female    DOB: May 09, 1946, 65 y.o.   MRN: [id number]  HPI Dyslipidemia assessment: Prior Advanced Lipid Testing:   NMR Lipoprofile 2004: LDL 160 ( 1881/ 842),LDL goal = < 100, ideally < 70 .  Family history of premature CAD/ MI: Nutrition: MGF MI @ 64; PGM MI @ 24 .  Exercise: to begin . Diabetes : A1c 6.3% . HTN: well controlled. Smoking history  : never .   Lab results reviewed :RISKS:LDL > 100; CRP; increased small dense particles;insulin 43; A1c 6.3%   Review of Systems ROS: fatigue: no  ; chest pain : no ;claudication: no; palpitations: no; abd pain/bowel changes: no ; myalgias:no;  syncope : no ; memory loss: no;skin changes: no.      Objective:   Physical Exam General appearance is one of good health and nourishment w/o distress.  Eyes: No conjunctival inflammation or scleral icterus is present.    Heart:  Normal rate and regular rhythm. S1 and S2 normal without gallop,  click, rub or other extra sounds  . Grade 1/6 systolic murmur   Lungs:Chest clear to auscultation; no wheezes, rhonchi,rales ,or rubs present.No increased work of breathing.   Abdomen: bowel sounds normal, soft and non-tender without masses, organomegaly or hernias noted.  No guarding or rebound   Skin:Warm & dry.  Intact without suspicious lesions or rashes ; no jaundice  Lymphatic: No lymphadenopathy is noted about the head, neck, axilla areas.              Assessment & Plan:  #1 dyslipidemia ; risks & options discussed

## 2011-07-26 NOTE — Progress Notes (Signed)
Addended by: Maurice Small on: 07/26/2011 12:14 PM   Modules accepted: Orders

## 2011-07-26 NOTE — Assessment & Plan Note (Signed)
A1c 6.3%; insulin level 43. Nutrition discussed

## 2011-07-26 NOTE — Assessment & Plan Note (Signed)
BP well controlled @ home. On Clonidine & Beta Blocker

## 2011-08-06 ENCOUNTER — Other Ambulatory Visit: Payer: Self-pay | Admitting: Internal Medicine

## 2011-08-06 MED ORDER — LORAZEPAM 0.5 MG PO TABS: 0.5000 mg | ORAL_TABLET | Freq: Three times a day (TID) | ORAL | Status: DC

## 2011-08-06 NOTE — Telephone Encounter (Signed)
[facility]

## 2011-10-25 ENCOUNTER — Encounter: Payer: Self-pay | Admitting: Internal Medicine

## 2012-01-07 ENCOUNTER — Encounter: Payer: Self-pay | Admitting: Internal Medicine

## 2012-08-04 ENCOUNTER — Other Ambulatory Visit: Payer: Self-pay | Admitting: *Deleted

## 2012-08-04 DIAGNOSIS — E7800 Pure hypercholesterolemia, unspecified: Secondary | ICD-10-CM

## 2012-08-04 DIAGNOSIS — I10 Essential (primary) hypertension: Secondary | ICD-10-CM

## 2012-08-04 MED ORDER — ROSUVASTATIN CALCIUM 5 MG PO TABS: 5.0000 mg | ORAL_TABLET | ORAL | Status: DC

## 2012-08-04 MED ORDER — METOPROLOL TARTRATE 100 MG PO TABS: ORAL_TABLET | ORAL | Status: DC

## 2012-08-04 MED ORDER — CLONIDINE HCL 0.2 MG PO TABS: ORAL_TABLET | ORAL | Status: DC

## 2012-08-04 NOTE — Telephone Encounter (Signed)
Refills for crestor, clonidine, and lopressor sent to [facility] pharmacy

## 2012-08-05 ENCOUNTER — Other Ambulatory Visit: Payer: Self-pay | Admitting: *Deleted

## 2012-08-05 ENCOUNTER — Other Ambulatory Visit: Payer: Self-pay | Admitting: Internal Medicine

## 2012-08-05 DIAGNOSIS — E7800 Pure hypercholesterolemia, unspecified: Secondary | ICD-10-CM

## 2012-08-05 MED ORDER — ROSUVASTATIN CALCIUM 5 MG PO TABS: 5.0000 mg | ORAL_TABLET | ORAL | Status: DC

## 2012-08-06 NOTE — Telephone Encounter (Signed)
Pending appointment on 08/2012

## 2012-09-17 ENCOUNTER — Encounter: Payer: Self-pay | Admitting: Lab

## 2012-09-18 ENCOUNTER — Encounter: Payer: Self-pay | Admitting: Internal Medicine

## 2012-09-18 ENCOUNTER — Ambulatory Visit (INDEPENDENT_AMBULATORY_CARE_PROVIDER_SITE_OTHER): Payer: Medicare Other | Admitting: Internal Medicine

## 2012-09-18 VITALS — BP 114/72 | HR 53 | Temp 97.8°F | Resp 14 | Ht 64.03 in | Wt 172.0 lb

## 2012-09-18 DIAGNOSIS — E119 Type 2 diabetes mellitus without complications: Secondary | ICD-10-CM

## 2012-09-18 DIAGNOSIS — E785 Hyperlipidemia, unspecified: Secondary | ICD-10-CM

## 2012-09-18 DIAGNOSIS — Z0000 Encounter for general adult medical examination without abnormal findings: Secondary | ICD-10-CM

## 2012-09-18 DIAGNOSIS — I10 Essential (primary) hypertension: Secondary | ICD-10-CM

## 2012-09-18 DIAGNOSIS — K219 Gastro-esophageal reflux disease without esophagitis: Secondary | ICD-10-CM

## 2012-09-18 DIAGNOSIS — R9431 Abnormal electrocardiogram [ECG] [EKG]: Secondary | ICD-10-CM | POA: Insufficient documentation

## 2012-09-18 DIAGNOSIS — Z23 Encounter for immunization: Secondary | ICD-10-CM

## 2012-09-18 LAB — BASIC METABOLIC PANEL
BUN: 9 mg/dL (ref 6–23)
CO2: 28 mEq/L (ref 19–32)
Calcium: 10.3 mg/dL (ref 8.4–10.5)
Creatinine, Ser: 0.9 mg/dL (ref 0.4–1.2)

## 2012-09-18 LAB — CBC WITH DIFFERENTIAL/PLATELET
Basophils Absolute: 0.1 10*3/uL (ref 0.0–0.1)
Basophils Relative: 0.9 % (ref 0.0–3.0)
Eosinophils Absolute: 0.2 10*3/uL (ref 0.0–0.7)
Lymphocytes Relative: 28.6 % (ref 12.0–46.0)
MCHC: 34.1 g/dL (ref 30.0–36.0)
Monocytes Absolute: 0.6 10*3/uL (ref 0.1–1.0)
Neutrophils Relative %: 61.2 % (ref 43.0–77.0)
Platelets: 310 10*3/uL (ref 150.0–400.0)
RBC: 4.86 Mil/uL (ref 3.87–5.11)
RDW: 13.4 % (ref 11.5–14.6)

## 2012-09-18 LAB — HEPATIC FUNCTION PANEL
Alkaline Phosphatase: 88 U/L (ref 39–117)
Bilirubin, Direct: 0.1 mg/dL (ref 0.0–0.3)
Total Bilirubin: 1.4 mg/dL — ABNORMAL HIGH (ref 0.3–1.2)

## 2012-09-18 LAB — LIPID PANEL
HDL: 53.3 mg/dL (ref >39.00)
LDL Cholesterol: 116 mg/dL — ABNORMAL HIGH (ref 0–99)
Total CHOL/HDL Ratio: 4
Triglycerides: 129 mg/dL (ref 0.0–149.0)
VLDL: 25.8 mg/dL (ref 0.0–40.0)

## 2012-09-18 LAB — TSH: TSH: 0.58 u[IU]/mL (ref 0.35–5.50)

## 2012-09-18 MED ORDER — METOPROLOL TARTRATE 100 MG PO TABS: ORAL_TABLET | ORAL | Status: DC

## 2012-09-18 MED ORDER — CLONIDINE HCL 0.2 MG PO TABS: ORAL_TABLET | ORAL | Status: DC

## 2012-09-18 NOTE — Patient Instructions (Addendum)
Preventive Health Care: Exercise  30-45  minutes a day, 3-4 days a week. Walking is especially valuable in preventing Osteoporosis. Eat a low-fat diet with lots of fruits and vegetables, up to 7-9 servings per day. This would eliminate need for vitamin supplements for most individuals. Consume less than 30 grams of sugar per day from foods & drinks with High Fructose Corn Syrup as #2,3 or #4 on label. The legal document "Health Care Power of Attorney & Living Will " verifies decisions concerning your health care. Take the EKG to any emergency room or preop visits. There are nonspecific changes; as long as there is no new change these are not clinically significant . If the old EKG is not available for comparison; it may result in unnecessary hospitalization for observation with significant unnecessary expense.  If you activate the  My Chart system; lab & Xray results will be released directly  to you as soon as I review & address these through the computer. As per [place] all records must be reviewed and signed off within 72 hours; but I attempt to complete this within 36 hours unless I have no access to the electronic medical record.If I wait more than 36 hours the volume of reports becomes difficult to manage optimally. In my absence ;my partners would address the results.Critical lab results will be communicated to you ASAP. If you choose not to sign up for My Chart within 36 hours of labs being drawn; results will be reviewed & interpretation added before being copied & mailed, causing a delay in getting the results to you.If you do not receive that report within 7-10 days ,please call. Additionally you can use this system to gain direct  access to your records  if  out of town or @ an office of a  physician who is not in  the My Chart network.  This improves continuity of care & places you in control of your medical record.

## 2012-09-18 NOTE — Progress Notes (Signed)
Subjective:    Patient ID: Courtney Russell, female    DOB: [birth year], 67 y.o.   MRN: [id number]  HPI Medicare Wellness Visit:  Psychosocial & medical history were reviewed as required by Medicare (abuse,antisocial behavioral risks,firearm risk).  Social history: caffeine:tea 3-4 X/ week , alcohol: no  ,  tobacco use:   never Exercise :  none No home & personal  safety / fall risk Activities of daily living: no limitations  Seatbelt  and smoke alarm employed. Power of Attorney/Living Will status : needed Ophthalmology exam pending Hearing evaluation not current Orientation :oriented X 3  Memory & recall :good Spelling  testing:good Mood & affect : normal . Depression / anxiety: denied but family health issues cause stress Travel history : last [place] 2010  Immunization status : Shingles needed Transfusion history:  Post TAH (autologous)  Preventive health surveillance ( colonoscopies, BMD , mammograms,PAP as per protocol/ SOC):  Mammogram & BMD due 4/14 Dental care:  Every 6 mos. Chart reviewed &  Updated. Active issues reviewed & addressed.      Review of Systems HYPERTENSION: Disease Monitoring: Blood pressure range-126/60s-80  Chest pain, palpitations- no      Dyspnea- no Medications: Compliance- yes  Lightheadedness,Syncope- no    Edema- minor   DIABETES: Disease Monitoring: Blood Sugar ranges-no  Polyuria/phagia/dipsia- no      Visual problems- no Medications: Compliance- no Diet: none   HYPERLIPIDEMIA: Disease Monitoring: See symptoms for Hypertension Medications: Compliance- yes Abd pain, bowel changes- no   Muscle aches- no        Objective:   Physical Exam Gen.: Healthy and well-nourished in appearance. Alert, appropriate and cooperative throughout exam. Appears younger than stated age  Head: Normocephalic without obvious abnormalities  Eyes: No corneal or conjunctival inflammation noted. Pupils equal round reactive to light and  accommodation. Ptosis bilaterally. Extraocular motion intact. Vision grossly normal right  Ears:  External  ear exam reveals no significant lesions or deformities. Canals clear .R TM opaque. Hearing is grossly normal bilaterally. Nose: External nasal exam reveals no deformity or inflammation. Nasal mucosa are pink and moist. No lesions or exudates noted.   Mouth: Oral mucosa and oropharynx reveal no lesions or exudates. Teeth in good repair. Neck: No deformities, masses, or tenderness noted. Range of motion & Thyroid normal. Lungs: Normal respiratory effort; chest expands symmetrically. Lungs are clear to auscultation without rales, wheezes, or increased work of breathing. Heart: Normal rate and rhythm. Normal S1 and S2. No gallop, click, or rub. No murmur. Abdomen: Bowel sounds normal; abdomen soft and nontender. No masses, organomegaly or hernias noted. Genitalia: As per Gyn                                  Musculoskeletal/extremities: No significant deformity or scoliosis noted of  the thoracic or lumbar spine.  No clubbing, cyanosis, edema, or significant extremity  deformity noted. Range of motion normal .Tone & strength  Normal. Joints normal. Nail health good. Able to lie down & sit up w/o help. Negative SLR bilaterally Vascular: Carotid, radial artery, dorsalis pedis and  posterior tibial pulses are full and equal. No bruits present. Neurologic: Alert and oriented x3. Deep tendon reflexes symmetrical and normal.        Skin: Intact without suspicious lesions or rashes. Lymph: No cervical, axillary lymphadenopathy present. Psych: Mood and affect are normal. Normally interactive  Assessment & Plan:  #1 Medicare Wellness Exam; criteria met ; data entered #2 Problem List reviewed ; Assessment/ Recommendations made Plan: see Orders

## 2012-09-22 ENCOUNTER — Other Ambulatory Visit: Payer: Self-pay | Admitting: *Deleted

## 2012-09-22 DIAGNOSIS — I10 Essential (primary) hypertension: Secondary | ICD-10-CM

## 2012-09-22 MED ORDER — CLONIDINE HCL 0.2 MG PO TABS: ORAL_TABLET | ORAL | Status: DC

## 2012-09-22 NOTE — Progress Notes (Signed)
Pt indicated that Rx was not at pharmacy,Rx re-sent

## 2013-01-02 ENCOUNTER — Encounter (INDEPENDENT_AMBULATORY_CARE_PROVIDER_SITE_OTHER): Payer: Self-pay

## 2013-02-05 ENCOUNTER — Encounter: Payer: Self-pay | Admitting: Internal Medicine

## 2013-02-23 ENCOUNTER — Ambulatory Visit (INDEPENDENT_AMBULATORY_CARE_PROVIDER_SITE_OTHER): Payer: Medicare Other | Admitting: General Surgery

## 2013-02-23 ENCOUNTER — Encounter (INDEPENDENT_AMBULATORY_CARE_PROVIDER_SITE_OTHER): Payer: Self-pay | Admitting: General Surgery

## 2013-02-23 VITALS — BP 160/98 | HR 60 | Resp 14 | Ht 64.5 in | Wt 171.8 lb

## 2013-02-23 DIAGNOSIS — R229 Localized swelling, mass and lump, unspecified: Secondary | ICD-10-CM

## 2013-02-23 DIAGNOSIS — N6459 Other signs and symptoms in breast: Secondary | ICD-10-CM

## 2013-02-23 DIAGNOSIS — R2242 Localized swelling, mass and lump, left lower limb: Secondary | ICD-10-CM

## 2013-02-23 DIAGNOSIS — N6452 Nipple discharge: Secondary | ICD-10-CM

## 2013-02-23 NOTE — Progress Notes (Signed)
Patient ID: Courtney Russell, female   DOB: [birth year], 67 y.o.   MRN: [id number]  Chief Complaint  Patient presents with  . New Evaluation    eval rt nipple d/c    HPI Courtney Russell is a 67 y.o. female.   HPI This is a 67 year old female who I know from an excisional biopsy in 2004 for left breast intraductal papilloma that was noted after having discharge. She comes in today after her having some right-sided clear discharge about 3-4 times since May. This has not happened since June. This is always been clear or yellow. It appears it has always been expressed also. She was then evaluated with a mammogram that showed heterogeneously dense breasts with some postoperative changes on the left but no abnormalities on the right. A right breast ultrasound showed no focal duct ectasia or intraluminal mass. In the notes in does state that a clear nipple discharge at about 9:00 close to the areolar edge was able to be expressed. She underwent a ductogram after that. Posterior to the cannulated duct approximately 1.5 cm below it had a central duct that was filled and slightly distended with contrast with an abrupt slightly irregular filling defect noted. An ultrasound was done post procedure that showed some mild fluid filling of the duct system and an isoechoic intraluminal mass measuring 7 mm that was approximately 1.2 cm deep to the skin immediately behind the nipple. She was then sent for evaluation.  Past Medical History  Diagnosis Date  . Hypertension   . Delivery normal     3 children  . High cholesterol   . IBS (irritable bowel syndrome)   . Palpitations   . GERD (gastroesophageal reflux disease)     Past Surgical History  Procedure Laterality Date  . Total abdominal hysterectomy w/ bilateral salpingoophorectomy      fibroids  . Tubal ligation    . Tonsillectomy    . Cholecystectomy    . Colonoscopy  1998, 2008    negative;Dr Juanda Chance  . Abdominal hysterectomy    . Breast surgery      duct excision    Family History  Problem Relation Age of Onset  . Cancer Father     COLON & PROSTATE  . Diabetes Father   . Alzheimer's disease Father   . Heart attack Father     MI in late 11s; CABG @ 51  . Dementia Mother   . Stroke Mother     TIAs; AVM leaked  . Colon polyps Mother   . Breast cancer Sister   . Cancer Sister     breast  . Coronary artery disease Sister     stent  . Diabetes Paternal Aunt     Social History History  Substance Use Topics  . Smoking status: Never Smoker   . Smokeless tobacco: Never Used  . Alcohol Use: No    Allergies  Allergen Reactions  . Fish Oil     REACTION: rash  . Codeine     Mental status changes  . Hydrochlorothiazide     REACTION: low potassium level    Current Outpatient Prescriptions  Medication Sig Dispense Refill  . aspirin 81 MG tablet Take 81 mg by mouth 1 dose over 46 hours.        . cloNIDine (CATAPRES) 0.2 MG tablet TAKE ONE-HALF TABLET BY MOUTH TWICE DAILY  90 tablet  3  . esomeprazole (NEXIUM) 20 MG capsule Take 20 mg by mouth.      Marland Kitchen  glucose blood test strip 1 each by Other route as needed. Use as instructed       . Lancets (FREESTYLE) lancets 1 each by Other route as needed. Use as instructed       . metoprolol (LOPRESSOR) 100 MG tablet TAKE ONE-HALF TABLET BY MOUTH TWICE DAILY  90 tablet  3  . rosuvastatin (CRESTOR) 5 MG tablet Take 1 tablet (5 mg total) by mouth every other day.  45 tablet  1   No current facility-administered medications for this visit.    Review of Systems Review of Systems  Constitutional: Negative for fever, chills and unexpected weight change.  HENT: Negative for hearing loss, congestion, sore throat, trouble swallowing and voice change.   Eyes: Negative for visual disturbance.  Respiratory: Negative for cough and wheezing.   Cardiovascular: Positive for leg swelling. Negative for chest pain and palpitations.  Gastrointestinal: Negative for nausea, vomiting, abdominal pain,  diarrhea, constipation, blood in stool, abdominal distention and anal bleeding.  Genitourinary: Negative for hematuria, vaginal bleeding and difficulty urinating.  Musculoskeletal: Negative for arthralgias.  Skin: Negative for rash and wound.  Neurological: Negative for seizures, syncope and headaches.  Hematological: Negative for adenopathy. Does not bruise/bleed easily.  Psychiatric/Behavioral: Negative for confusion.    Blood pressure 160/98, pulse 60, resp. rate 14, height 5' 4.5" (1.638 m), weight 171 lb 12.8 oz (77.928 kg).  Physical Exam Physical Exam  Vitals reviewed. Constitutional: She appears well-developed and well-nourished.  Pulmonary/Chest: Right breast exhibits no inverted nipple, no mass, no nipple discharge, no skin change and no tenderness. Left breast exhibits no inverted nipple, no mass, no nipple discharge, no skin change and no tenderness.    Musculoskeletal:       Legs: Lymphadenopathy:    She has no cervical adenopathy.    Data Reviewed Mm, ductogram, [place] reports and films from [facility]  Assessment    Right breast nipple discharge   left thigh mass  Plan    I can't really identify any of his discharge today. It sounds like it was difficult for this to be identified radiologically also. All of the factors make this appear to be a benign discharge it may possibly be from a papilloma. Certainly one option would be to do an excisional biopsy of this. I think hitalso might be reasonable to follow this. Right now this will be a central duct excision that would essentially be blind. I will discuss this with Dr. Yolanda Bonine and I will call the patient back.    I don't think she needs any further treatment for the left thigh mass    Courtney Russell 02/23/2013, 11:04 AM

## 2013-02-24 ENCOUNTER — Telehealth (INDEPENDENT_AMBULATORY_CARE_PROVIDER_SITE_OTHER): Payer: Self-pay

## 2013-02-24 NOTE — Telephone Encounter (Signed)
Message copied by Ethlyn Gallery on Tue Feb 24, 2013 11:04 AM ------      Message from: [place], [place]      Created: Tue Feb 24, 2013 10:51 AM       Elease Hashimoto,      I spoke with Yolanda Bonine about her today.  She is comfortable with a 3 month f/u u/s which would be end of September.  If she has more discharge she needs to come see me sooner otherwise i can see after u/s  Would you please call and tell her this?      Thanks,      MW ------

## 2013-02-24 NOTE — Telephone Encounter (Signed)
Called pt to notify her that Dr Dwain Sarna did speak to Dr Yolanda Bonine and they both agree for the pt to get a f/u br u/s at the end of September. The pt will need to see Dr Dwain Sarna after the u/s. The pt was advised to call us to make the appt with Dr Dwain Sarna once she has the appt with [facility] scheduled. The pt is advised if anymore drainage she will need to be seen sooner by Dr Dwain Sarna. The pt understands.

## 2013-03-02 ENCOUNTER — Encounter (INDEPENDENT_AMBULATORY_CARE_PROVIDER_SITE_OTHER): Payer: Self-pay

## 2013-06-04 ENCOUNTER — Other Ambulatory Visit: Payer: Self-pay

## 2013-07-07 ENCOUNTER — Encounter (INDEPENDENT_AMBULATORY_CARE_PROVIDER_SITE_OTHER): Payer: Self-pay

## 2013-09-21 ENCOUNTER — Other Ambulatory Visit (INDEPENDENT_AMBULATORY_CARE_PROVIDER_SITE_OTHER): Payer: Medicare Other

## 2013-09-21 ENCOUNTER — Ambulatory Visit (INDEPENDENT_AMBULATORY_CARE_PROVIDER_SITE_OTHER): Payer: Medicare Other | Admitting: Internal Medicine

## 2013-09-21 ENCOUNTER — Encounter: Payer: Self-pay | Admitting: Internal Medicine

## 2013-09-21 VITALS — BP 138/80 | HR 53 | Temp 97.5°F | Ht 64.0 in | Wt 170.7 lb

## 2013-09-21 DIAGNOSIS — E785 Hyperlipidemia, unspecified: Secondary | ICD-10-CM

## 2013-09-21 DIAGNOSIS — K219 Gastro-esophageal reflux disease without esophagitis: Secondary | ICD-10-CM

## 2013-09-21 DIAGNOSIS — I10 Essential (primary) hypertension: Secondary | ICD-10-CM

## 2013-09-21 DIAGNOSIS — Z0000 Encounter for general adult medical examination without abnormal findings: Secondary | ICD-10-CM

## 2013-09-21 DIAGNOSIS — E119 Type 2 diabetes mellitus without complications: Secondary | ICD-10-CM

## 2013-09-21 LAB — CBC WITH DIFFERENTIAL/PLATELET
BASOS PCT: 0.7 % (ref 0.0–3.0)
Basophils Absolute: 0.1 10*3/uL (ref 0.0–0.1)
EOS ABS: 0.2 10*3/uL (ref 0.0–0.7)
Eosinophils Relative: 2 % (ref 0.0–5.0)
HCT: 46.2 % — ABNORMAL HIGH (ref 36.0–46.0)
HEMOGLOBIN: 15.4 g/dL — AB (ref 12.0–15.0)
Lymphocytes Relative: 31.6 % (ref 12.0–46.0)
Lymphs Abs: 2.6 10*3/uL (ref 0.7–4.0)
MCHC: 33.4 g/dL (ref 30.0–36.0)
MCV: 91.6 fl (ref 78.0–100.0)
MONO ABS: 0.5 10*3/uL (ref 0.1–1.0)
Monocytes Relative: 5.7 % (ref 3.0–12.0)
NEUTROS ABS: 4.9 10*3/uL (ref 1.4–7.7)
Neutrophils Relative %: 60 % (ref 43.0–77.0)
Platelets: 330 10*3/uL (ref 150.0–400.0)
RBC: 5.04 Mil/uL (ref 3.87–5.11)
RDW: 13.4 % (ref 11.5–14.6)
WBC: 8.1 10*3/uL (ref 4.5–10.5)

## 2013-09-21 LAB — LIPID PANEL
CHOLESTEROL: 254 mg/dL — AB (ref 0–200)
HDL: 51.6 mg/dL (ref >39.00)
Total CHOL/HDL Ratio: 5
Triglycerides: 168 mg/dL — ABNORMAL HIGH (ref 0.0–149.0)
VLDL: 33.6 mg/dL (ref 0.0–40.0)

## 2013-09-21 LAB — LDL CHOLESTEROL, DIRECT: LDL DIRECT: 185 mg/dL

## 2013-09-21 LAB — BASIC METABOLIC PANEL
BUN: 11 mg/dL (ref 6–23)
CO2: 26 mEq/L (ref 19–32)
Calcium: 10.7 mg/dL — ABNORMAL HIGH (ref 8.4–10.5)
Chloride: 106 mEq/L (ref 96–112)
Creatinine, Ser: 0.8 mg/dL (ref 0.4–1.2)
GFR: 73.08 mL/min (ref >60.00)
Glucose, Bld: 115 mg/dL — ABNORMAL HIGH (ref 70–99)
POTASSIUM: 4.6 meq/L (ref 3.5–5.1)
SODIUM: 139 meq/L (ref 135–145)

## 2013-09-21 LAB — HEPATIC FUNCTION PANEL
ALBUMIN: 3.8 g/dL (ref 3.5–5.2)
ALT: 41 U/L — ABNORMAL HIGH (ref 0–35)
AST: 47 U/L — AB (ref 0–37)
Alkaline Phosphatase: 91 U/L (ref 39–117)
Bilirubin, Direct: 0.1 mg/dL (ref 0.0–0.3)
Total Bilirubin: 1.3 mg/dL — ABNORMAL HIGH (ref 0.3–1.2)
Total Protein: 7.5 g/dL (ref 6.0–8.3)

## 2013-09-21 LAB — TSH: TSH: 0.47 u[IU]/mL (ref 0.35–5.50)

## 2013-09-21 LAB — MICROALBUMIN / CREATININE URINE RATIO
CREATININE, U: 57.4 mg/dL
MICROALB UR: 0.7 mg/dL (ref 0.0–1.9)
Microalb Creat Ratio: 1.2 mg/g (ref 0.0–30.0)

## 2013-09-21 LAB — HEMOGLOBIN A1C: Hgb A1c MFr Bld: 6.4 % (ref 4.6–6.5)

## 2013-09-21 MED ORDER — CLONIDINE HCL 0.2 MG PO TABS: ORAL_TABLET | ORAL | Status: DC

## 2013-09-21 MED ORDER — METOPROLOL TARTRATE 100 MG PO TABS: ORAL_TABLET | ORAL | Status: DC

## 2013-09-21 NOTE — Progress Notes (Signed)
Subjective:    Patient ID: Courtney Russell, female    DOB: [birth year], 68 y.o.   MRN: [id number]  HPI Medicare Wellness Visit: Psychosocial and medical history were reviewed as required by Medicare (history related to abuse, antisocial behavior , firearm risk). Social history: Caffeine:none; rare tea , Alcohol:  no, Tobacco WUJ:WJXBJ Exercise:to start Silver Sneakers Personal safety/fall risk:no Limitations of activities of daily living:no Seatbelt/ smoke alarm use:yes Healthcare Power of Attorney/Living Will status:not in place Ophthalmologic exam status:pending Hearing evaluation status:not UTD Orientation: Oriented X 3 Memory and recall: good Spelling  testing: good Depression/anxiety assessment: no Foreign travel history:[place] 2008 Immunization status for influenza/pneumonia/ shingles /tetanus:Shingles needed Transfusion history:autologous only post TAH Preventive health care maintenance status: Colonoscopy/BMD/mammogram/Pap as per protocol/standard care: BMD due Dental care:every 6 mos Chart reviewed and updated. Active issues reviewed and addressed as documented below.    Review of Systems HYPERTENSION: Disease Monitoring: Blood pressure range/ average : 120-130/70s Medication Compliance:yes  Diabetes :  FBS range/average:no monitor Highest 2 hr post meal glucose:no monitor Medication compliance:no meds; modified low sugar diet Hypoglycemia:no Ophthamology care:appt pending Podiatry care:no  HYPERLIPIDEMIA: Disease Monitoring: Medication Compliance:off statin due to LFTs  Chest pain, palpitations: no     Dyspnea:no Edema:"tiny bit" Claudication: no Lightheadedness,Syncope:rare lightheadedness, non postural Weight gain/loss:down 5# Polyuria/phagia/dipsia: no     Blurred vision /diplopia/lossof vision:no Limb numbness/tingling/burningNon healing skin lesions:no Abd pain, bowel changes: no  Myalgias: no;no but 4th & 5th L fingers soreness & decreased ROM  in am Memory loss:no       Objective:   Physical Exam  Gen.: Healthy and well-nourished in appearance. Alert, appropriate and cooperative throughout exam.  Head: Normocephalic without obvious abnormalities  Eyes: No corneal or conjunctival inflammation noted. Pupils equal round reactive to light and accommodation. Extraocular motion intact. Some ptosis Ears: External  ear exam reveals no significant lesions or deformities. Canals clear .TMs normal. Hearing is grossly normal bilaterally. Nose: External nasal exam reveals no deformity or inflammation. Nasal mucosa are pink and moist. No lesions or exudates noted.   Mouth: Oral mucosa and oropharynx reveal no lesions or exudates. Teeth in good repair. Neck: No deformities, masses, or tenderness noted. Range of motion & Thyroid normal Lungs: Normal respiratory effort; chest expands symmetrically. Lungs are clear to auscultation without rales, wheezes, or increased work of breathing. Heart: Normal rate and rhythm. Normal S1 and S2. No gallop, click, or rub.No murmur. Abdomen: Bowel sounds normal; abdomen soft and nontender. No masses, organomegaly or hernias noted. Genitalia:  as per Gyn                                  Musculoskeletal/extremities: No deformity or scoliosis noted of  the thoracic or lumbar spine.   No clubbing, cyanosis, edema, or significant extremity  deformity noted. Range of motion normal .Tone & strength normal. Hand joints normal OR reveal mild  DJD DIP changes.  Fingernail / toenail health good. Able to lie down & sit up w/o help. Negative SLR bilaterally Vascular: Carotid, radial artery, dorsalis pedis and  posterior tibial pulses are full and equal. No bruits present. Neurologic: Alert and oriented x3. Deep tendon reflexes symmetrical and normal.  Gait normal  including heel & toe walking . Rhomberg & finger to nose       Skin: Intact without suspicious lesions or rashes. Lymph: No cervical, axillary, or inguinal  lymphadenopathy present. Psych: Mood and affect are  normal. Normally interactive                                                                                       Assessment & Plan:  #1 Medicare Wellness Exam; criteria met ; data entered #2 Problem List/Diagnoses reviewed #3 palmar tenosynovitis Plan:  Assessments made/ Orders entered

## 2013-09-21 NOTE — Progress Notes (Signed)
HYPERTENSION: Disease Monitoring: Blood pressure range/ average : Medication Compliance:  FASTING HYPERGLYCEMIA OR Diabetes :  FBS range/average: Highest 2 hr post meal glucose: Medication compliance: Hypoglycemia: Ophthamology care: Podiatry care:  HYPERLIPIDEMIA: Disease Monitoring: Medication Compliance:  Chest pain, palpitations:       Dyspnea: Edema: Claudication:  Lightheadedness,Syncope: Weight gain/loss: Polyuria/phagia/dipsia:      Blurred vision /diplopia/lossof vision: Limb numbness/tingling/burning: Non healing skin lesions: Abd pain, bowel changes:   Myalgias:  Memory loss:

## 2013-09-21 NOTE — Patient Instructions (Addendum)
Your next office appointment will be determined based upon review of your pending labs . Those instructions will be transmitted to you through My Chart  . Use an anti-inflammatory cream such as Aspercreme or Zostrix cream twice a day to the affected area as needed. Do not apply ice . Preventive Health Care: Exercise  30-45  minutes a day, 3-4 days a week. Walking is especially valuable in preventing Osteoporosis. Eat a low-fat diet with lots of fruits and vegetables, up to 7-9 servings per day. This would eliminate need for vitamin supplements for most individuals. Avoid obesity; your goal = waist less than 35 inches.Consume less than 30 grams of sugar per day from foods & drinks with High Fructose Corn Syrup as #2,3 or #4 on label. Eye Doctor - have an eye exam @ least annually The legal document "[place] Will " verifies decisions concerning your health care. Depression is common in our stressful world. If you're feeling down or losing interest in things you normally enjoy, please be seen. Violence - If anyone is threatening or hurting you, please call immediately.

## 2013-09-21 NOTE — Progress Notes (Signed)
Pre visit review using our clinic review tool, if applicable. No additional management support is needed unless otherwise documented below in the visit note. 

## 2013-09-22 ENCOUNTER — Encounter: Payer: Self-pay | Admitting: Internal Medicine

## 2013-09-22 ENCOUNTER — Other Ambulatory Visit: Payer: Self-pay | Admitting: Internal Medicine

## 2013-09-22 DIAGNOSIS — E785 Hyperlipidemia, unspecified: Secondary | ICD-10-CM

## 2013-09-22 DIAGNOSIS — R7401 Elevation of levels of liver transaminase levels: Secondary | ICD-10-CM

## 2013-09-22 DIAGNOSIS — R740 Nonspecific elevation of levels of transaminase and lactic acid dehydrogenase [LDH]: Secondary | ICD-10-CM

## 2013-09-22 MED ORDER — EZETIMIBE 10 MG PO TABS: 10.0000 mg | ORAL_TABLET | Freq: Every day | ORAL | Status: DC

## 2013-09-25 ENCOUNTER — Other Ambulatory Visit: Payer: Self-pay | Admitting: *Deleted

## 2013-09-25 DIAGNOSIS — E785 Hyperlipidemia, unspecified: Secondary | ICD-10-CM

## 2013-09-25 MED ORDER — EZETIMIBE 10 MG PO TABS: 10.0000 mg | ORAL_TABLET | Freq: Every day | ORAL | Status: DC

## 2013-09-25 NOTE — Telephone Encounter (Signed)
Sent email pharmacy never received zetia rx. Pt needing script to go to [facility]...Courtney Russell

## 2013-09-28 ENCOUNTER — Telehealth: Payer: Self-pay | Admitting: *Deleted

## 2013-09-28 NOTE — Telephone Encounter (Signed)
Patient phoned requesting samples for zetia.  Samples for a month's worth in bin.

## 2014-02-01 ENCOUNTER — Other Ambulatory Visit: Payer: Self-pay

## 2014-02-01 MED ORDER — CLONIDINE HCL 0.1 MG PO TABS: 0.1000 mg | ORAL_TABLET | Freq: Two times a day (BID) | ORAL | Status: DC

## 2014-03-22 ENCOUNTER — Encounter: Payer: Self-pay | Admitting: Internal Medicine

## 2014-04-28 ENCOUNTER — Other Ambulatory Visit: Payer: Self-pay | Admitting: Internal Medicine

## 2014-05-31 ENCOUNTER — Encounter: Payer: Self-pay | Admitting: Internal Medicine

## 2014-06-17 ENCOUNTER — Other Ambulatory Visit: Payer: Self-pay | Admitting: Internal Medicine

## 2014-09-21 ENCOUNTER — Other Ambulatory Visit (INDEPENDENT_AMBULATORY_CARE_PROVIDER_SITE_OTHER): Payer: Medicare Other

## 2014-09-21 ENCOUNTER — Encounter: Payer: Self-pay | Admitting: Internal Medicine

## 2014-09-21 ENCOUNTER — Ambulatory Visit (INDEPENDENT_AMBULATORY_CARE_PROVIDER_SITE_OTHER): Payer: Medicare Other | Admitting: Internal Medicine

## 2014-09-21 ENCOUNTER — Other Ambulatory Visit: Payer: Self-pay | Admitting: Internal Medicine

## 2014-09-21 VITALS — BP 148/90 | HR 50 | Temp 98.0°F | Ht 64.0 in | Wt 166.0 lb

## 2014-09-21 DIAGNOSIS — E785 Hyperlipidemia, unspecified: Secondary | ICD-10-CM

## 2014-09-21 DIAGNOSIS — G479 Sleep disorder, unspecified: Secondary | ICD-10-CM

## 2014-09-21 DIAGNOSIS — J310 Chronic rhinitis: Secondary | ICD-10-CM

## 2014-09-21 DIAGNOSIS — I10 Essential (primary) hypertension: Secondary | ICD-10-CM

## 2014-09-21 DIAGNOSIS — Z0000 Encounter for general adult medical examination without abnormal findings: Secondary | ICD-10-CM

## 2014-09-21 DIAGNOSIS — K219 Gastro-esophageal reflux disease without esophagitis: Secondary | ICD-10-CM

## 2014-09-21 DIAGNOSIS — E119 Type 2 diabetes mellitus without complications: Secondary | ICD-10-CM

## 2014-09-21 LAB — CBC WITH DIFFERENTIAL/PLATELET
Basophils Absolute: 0 10*3/uL (ref 0.0–0.1)
Basophils Relative: 0.4 % (ref 0.0–3.0)
Eosinophils Absolute: 0.2 10*3/uL (ref 0.0–0.7)
Eosinophils Relative: 2 % (ref 0.0–5.0)
HCT: 43.8 % (ref 36.0–46.0)
Hemoglobin: 15.2 g/dL — ABNORMAL HIGH (ref 12.0–15.0)
LYMPHS ABS: 2.7 10*3/uL (ref 0.7–4.0)
LYMPHS PCT: 31 % (ref 12.0–46.0)
MCHC: 34.7 g/dL (ref 30.0–36.0)
MCV: 87.4 fl (ref 78.0–100.0)
MONO ABS: 0.4 10*3/uL (ref 0.1–1.0)
Monocytes Relative: 4.9 % (ref 3.0–12.0)
NEUTROS PCT: 61.7 % (ref 43.0–77.0)
Neutro Abs: 5.4 10*3/uL (ref 1.4–7.7)
Platelets: 308 10*3/uL (ref 150.0–400.0)
RBC: 5.01 Mil/uL (ref 3.87–5.11)
RDW: 13.4 % (ref 11.5–15.5)
WBC: 8.8 10*3/uL (ref 4.0–10.5)

## 2014-09-21 LAB — BASIC METABOLIC PANEL
BUN: 10 mg/dL (ref 6–23)
CALCIUM: 10.3 mg/dL (ref 8.4–10.5)
CO2: 28 mEq/L (ref 19–32)
Chloride: 106 mEq/L (ref 96–112)
Creatinine, Ser: 0.84 mg/dL (ref 0.40–1.20)
GFR: 71.86 mL/min (ref >60.00)
GLUCOSE: 113 mg/dL — AB (ref 70–99)
POTASSIUM: 4.7 meq/L (ref 3.5–5.1)
SODIUM: 137 meq/L (ref 135–145)

## 2014-09-21 LAB — HEPATIC FUNCTION PANEL
ALBUMIN: 3.9 g/dL (ref 3.5–5.2)
ALK PHOS: 87 U/L (ref 39–117)
ALT: 32 U/L (ref 0–35)
AST: 38 U/L — AB (ref 0–37)
Bilirubin, Direct: 0.2 mg/dL (ref 0.0–0.3)
Total Bilirubin: 1.1 mg/dL (ref 0.2–1.2)
Total Protein: 7 g/dL (ref 6.0–8.3)

## 2014-09-21 LAB — HEMOGLOBIN A1C: Hgb A1c MFr Bld: 6.4 % (ref 4.6–6.5)

## 2014-09-21 LAB — LIPID PANEL
Cholesterol: 225 mg/dL — ABNORMAL HIGH (ref 0–200)
HDL: 52.9 mg/dL (ref >39.00)
LDL Cholesterol: 140 mg/dL — ABNORMAL HIGH (ref 0–99)
NonHDL: 172.1
Total CHOL/HDL Ratio: 4
Triglycerides: 163 mg/dL — ABNORMAL HIGH (ref 0.0–149.0)
VLDL: 32.6 mg/dL (ref 0.0–40.0)

## 2014-09-21 LAB — MICROALBUMIN / CREATININE URINE RATIO
Creatinine,U: 98.9 mg/dL
Microalb Creat Ratio: 0.7 mg/g (ref 0.0–30.0)
Microalb, Ur: <0.7 mg/dL (ref 0.0–1.9)

## 2014-09-21 LAB — TSH: TSH: 0.42 u[IU]/mL (ref 0.35–4.50)

## 2014-09-21 LAB — CALCIUM: Calcium: 10.3 mg/dL (ref 8.4–10.5)

## 2014-09-21 MED ORDER — CLONIDINE HCL 0.1 MG PO TABS: 0.1000 mg | ORAL_TABLET | Freq: Two times a day (BID) | ORAL | Status: DC

## 2014-09-21 MED ORDER — EZETIMIBE 10 MG PO TABS: 10.0000 mg | ORAL_TABLET | Freq: Every day | ORAL | Status: DC

## 2014-09-21 MED ORDER — ESOMEPRAZOLE MAGNESIUM 40 MG PO CPDR: 40.0000 mg | DELAYED_RELEASE_CAPSULE | Freq: Every day | ORAL | Status: DC

## 2014-09-21 MED ORDER — METOPROLOL TARTRATE 100 MG PO TABS: ORAL_TABLET | ORAL | Status: DC

## 2014-09-21 NOTE — Assessment & Plan Note (Signed)
A1c , urine microalbumin, BMET 

## 2014-09-21 NOTE — Telephone Encounter (Signed)
She is taking OTC 20 mg qd prn If she wants Rx ;it should be 40 mg strength once daily

## 2014-09-21 NOTE — Progress Notes (Signed)
Subjective:    Patient ID: Courtney Russell, female    DOB: June 28, 1946, 69 y.o.   MRN: [id number]  HPI  Medicare Wellness Visit: Psychosocial and medical history were reviewed as required by Medicare (history related to abuse, antisocial behavior , firearm risk). Social history: Caffeine: minimal Alcohol: no Tobacco use: never Exercise: walking 20 min 2 X/week Personal safety/fall risk: no Limitations of activities of daily living:no Seatbelt/ smoke alarm use:yes Healthcare Power of Attorney/Living Will status and End of Life process assessment : not UTD; indication discussed Ophthalmologic exam status: UTD Hearing evaluation status:not UTD Orientation: Oriented X 3 Memory and recall: good Spelling testing: good Depression/anxiety assessment: no Foreign travel history: 2015 [place] Immunization status for influenza/pneumonia/ shingles /tetanus: Shingles  & Prevnar 13 needed Transfusion history: 1991;auto transfusion Preventive health care maintenance status: Colonoscopy/BMD/mammogram/Pap as per protocol/standard care: UTD Dental care: every 6 mos Chart reviewed and updated. Active issues reviewed and addressed as documented below.   She is on low-salt, modified heart healthy diet. She's been compliant with her medicines. She questions whether Zetia may be contributing to fatigue.  She does have some chronic sleep issues. She will typically go to bed @11  pm but awaken at 1 occasionally. She will then be awake 3-4 hours.  Blood pressure at home ranges 125/74-135/76. Exercise is noted above. She denies associated cardiopulmonary symptoms.  She takes  OTC Nexium as needed only. She denies significant GI symptoms.     Review of Systems  Chest pain, palpitations, tachycardia, exertional dyspnea, paroxysmal nocturnal dyspnea, claudication or edema are absent.  Unexplained weight loss, abdominal pain, significant dyspepsia, dysphagia, melena, rectal bleeding, or persistently  small caliber stools are denied.   Some rhinitis symptoms over the past several weeks with sneezing.Frontal headache, facial pain , nasal purulence, dental pain, sore throat , otic pain or otic discharge denied. No fever , chills or sweats.        Objective:   Physical Exam  Gen.: Adequately nourished in appearance. Alert, appropriate and cooperative throughout exam.  Appears younger than stated age  Head: Normocephalic without obvious abnormalities  Eyes:Ptosis bilaterally. No corneal or conjunctival inflammation noted. Pupils equal round reactive to light and accommodation. Extraocular motion intact.  Ears: External  ear exam reveals no significant lesions or deformities. Canals clear .TMs normal. Hearing is grossly normal bilaterally. Nose: External nasal exam reveals no deformity or inflammation. Nasal mucosa are pink and moist. No lesions or exudates noted.   Mouth: Oral mucosa and oropharynx reveal no lesions or exudates. Teeth in good repair. Neck: No deformities, masses, or tenderness noted. Range of motion & Thyroid normal. Lungs: Normal respiratory effort; chest expands symmetrically. Lungs are clear to auscultation without rales, wheezes, or increased work of breathing. Heart: Normal rate and rhythm. Normal S1 and S2. No gallop, click, or rub.No murmur. Abdomen: Bowel sounds normal; abdomen soft and nontender. No masses, organomegaly or hernias noted.Dullness to persussion LUQ Genitalia:  as per Gyn                                  Musculoskeletal/extremities: Minimally accentuated curvature of upper thoracic spine. No clubbing, cyanosis, edema, or significant extremity  deformity noted.  Range of motion normal . Tone & strength normal. Hand joints normal Fingernail  health good. Crepitus of knees  Able to lie down & sit up w/o help.  Negative SLR bilaterally Vascular: Carotid, radial artery, dorsalis pedis and  posterior tibial pulses are full and equal. No bruits  present. Neurologic: Alert and oriented x3. Deep tendon reflexes symmetrical and normal.  Gait normal     Skin: Intact without suspicious lesions or rashes. Lymph: No cervical, axillary lymphadenopathy present. Psych: Mood and affect are normal. Normally interactive                                                                                     Assessment & Plan:  See Current Assessment & Plan in Problem List under specific DiagnosisThe labs will be reviewed and risks and options assessed. Written recommendations will be provided by mail or directly through My Chart.Further evaluation or change in medical therapy will be directed by those results.x

## 2014-09-21 NOTE — Assessment & Plan Note (Signed)
Lipids, LFTs, TSH  

## 2014-09-21 NOTE — Assessment & Plan Note (Signed)
CBC & dif  Anti reflux measures PPI pre b'fast prn

## 2014-09-21 NOTE — Progress Notes (Signed)
Pre visit review using our clinic review tool, if applicable. No additional management support is needed unless otherwise documented below in the visit note. 

## 2014-09-21 NOTE — Assessment & Plan Note (Signed)
Blood pressure goals reviewed. BMET 

## 2014-09-21 NOTE — Telephone Encounter (Signed)
Pt just want to make sure that we call all her med in for a 90 day supply.

## 2014-09-21 NOTE — Patient Instructions (Signed)
  Your next office appointment will be determined based upon review of your pending labs  Those instructions will be transmitted to you through My Chart Critical values will be called. Followup as needed for any active or acute issue. Please report any significant change in your symptoms.  Minimal Blood Pressure Goal= AVERAGE < 140/90;  Ideal is an AVERAGE < 135/85. This AVERAGE should be calculated from @ least 5-7 BP readings taken @ different times of day on different days of week. You should not respond to isolated BP readings , but rather the AVERAGE for that week .Please bring your  blood pressure cuff to office visits to verify that it is reliable.It  can also be checked against the blood pressure device at the pharmacy. Finger or wrist cuffs are not dependable; an arm cuff is.  Plain Mucinex (NOT D) for thick secretions ;force NON dairy fluids .   Nasal cleansing in the shower as discussed with lather of mild shampoo.After 10 seconds wash off lather while  exhaling through nostrils. Make sure that all residual soap is removed to prevent irritation.  Flonase OR Nasacort AQ 1 spray in each nostril twice a day as needed. Use the "crossover" technique into opposite nostril spraying toward opposite ear @ 45 degree angle, not straight up into nostril.  Plain Allegra (NOT D )  160 daily , Loratidine 10 mg , OR Zyrtec 10 mg @ bedtime  as needed for itchy eyes & sneezing.  To prevent sleep dysfunction follow these instructions for sleep hygiene. Do not read, watch TV, or eat in bed. Do not get into bed until you are ready to turn off the light &  to go to sleep. Do not ingest stimulants ( decongestants, diet pills, nicotine, caffeine) after the evening meal.Do not take daytime naps.Cardiovascular exercise, this can be as simple a program as walking, is recommended 30-45 minutes 3-4 times per week. If you're not exercising you should take 6-8 weeks to build up to this level.

## 2014-09-21 NOTE — Telephone Encounter (Signed)
Do you want patient take Nexium once or twice daily? Please advise.   All other medication have been sent to patients pharmacy

## 2014-09-27 ENCOUNTER — Telehealth: Payer: Self-pay | Admitting: Internal Medicine

## 2014-09-27 MED ORDER — ESOMEPRAZOLE MAGNESIUM 40 MG PO CPDR: 40.0000 mg | DELAYED_RELEASE_CAPSULE | Freq: Every day | ORAL | Status: DC

## 2014-09-27 NOTE — Telephone Encounter (Signed)
All meds have been sent 90 day supply with a refill per office protocol.

## 2014-09-27 NOTE — Telephone Encounter (Signed)
Pt called and said that all her meds are suppose to have 3 refills on meds.  She gets a   90 day supply and 3 additional refills.  She said only 30 days was called in?

## 2014-10-06 ENCOUNTER — Telehealth: Payer: Self-pay | Admitting: Internal Medicine

## 2014-10-06 DIAGNOSIS — I10 Essential (primary) hypertension: Secondary | ICD-10-CM

## 2014-10-06 NOTE — Telephone Encounter (Signed)
cloNIDine (CATAPRES) 0.1 MG tablet [[id number]]  metoprolol (LOPRESSOR) 100 MG tablet [[id number]]  ezetimibe (ZETIA) 10 MG tablet [[id number]]  Patient wants explanation as to why these RX were not given with 3 refills. In the past, they have always been given with three refills.

## 2014-10-07 MED ORDER — EZETIMIBE 10 MG PO TABS: 10.0000 mg | ORAL_TABLET | Freq: Every day | ORAL | Status: DC

## 2014-10-07 MED ORDER — METOPROLOL TARTRATE 100 MG PO TABS: ORAL_TABLET | ORAL | Status: DC

## 2014-10-07 MED ORDER — CLONIDINE HCL 0.1 MG PO TABS: 0.1000 mg | ORAL_TABLET | Freq: Two times a day (BID) | ORAL | Status: DC

## 2014-10-07 NOTE — Telephone Encounter (Signed)
meds have been sent with 3 refills

## 2014-10-11 ENCOUNTER — Telehealth: Payer: Self-pay

## 2014-10-11 NOTE — Telephone Encounter (Signed)
Spouse confirmed flu shot last fall, but does not remember where.

## 2015-01-20 ENCOUNTER — Telehealth: Payer: Self-pay | Admitting: Emergency Medicine

## 2015-01-20 NOTE — Telephone Encounter (Signed)
LVM with Seth Bake from [facility]. She was requesting pts last Annual Wellness Visit date, A1c results, BP, and Kidney function, and diabetic codes. Information was given for OV on 09/21/14.

## 2015-01-24 ENCOUNTER — Other Ambulatory Visit: Payer: Self-pay

## 2015-02-15 ENCOUNTER — Telehealth: Payer: Self-pay | Admitting: Internal Medicine

## 2015-02-15 NOTE — Telephone Encounter (Signed)
Patient will be needing refills on her prescriptions in a month or so. She is wondering if she will need an appointment to get these refilled.  Normally they are written for a year so she would need to see you once a year for these.  Please advise

## 2015-02-15 NOTE — Telephone Encounter (Signed)
Patient is wanting to get the pneumonia and shingle shot done at [facility] in [place] and the need a prescription from you. [facility] Phone [phone]

## 2015-02-16 ENCOUNTER — Telehealth: Payer: Self-pay | Admitting: Emergency Medicine

## 2015-02-16 DIAGNOSIS — I10 Essential (primary) hypertension: Secondary | ICD-10-CM

## 2015-02-16 MED ORDER — CLONIDINE HCL 0.1 MG PO TABS: 0.1000 mg | ORAL_TABLET | Freq: Two times a day (BID) | ORAL | Status: DC

## 2015-02-16 MED ORDER — METOPROLOL TARTRATE 100 MG PO TABS: ORAL_TABLET | ORAL | Status: DC

## 2015-02-16 MED ORDER — EZETIMIBE 10 MG PO TABS: 10.0000 mg | ORAL_TABLET | Freq: Every day | ORAL | Status: DC

## 2015-02-16 MED ORDER — ESOMEPRAZOLE MAGNESIUM 40 MG PO CPDR: 40.0000 mg | DELAYED_RELEASE_CAPSULE | Freq: Every day | ORAL | Status: DC

## 2015-02-16 NOTE — Telephone Encounter (Signed)
Spoke with pt, she asked that refills be sent to [facility] due to pricing. Refills were sent in up until next CPE is due. Pt requested that prevnar 13 and shingles rx be sent to her local pharmacy rather than coming into the office to have them done. Please advise on injection request. Pt was also informed to have new PCP by Oct 1st 2016 due to retirement.

## 2015-02-16 NOTE — Telephone Encounter (Signed)
Send Rx for 6 mos  Send Rx for immunizations as requested

## 2015-02-16 NOTE — Telephone Encounter (Signed)
Pt called in and said that there were a few questions she had about the meds that were called in.  Honestly taylor it will be easier if you can just call her when you get a chance.  She is wants all these meds sent all different places.     Best number [phone]

## 2015-02-17 ENCOUNTER — Other Ambulatory Visit: Payer: Self-pay | Admitting: Emergency Medicine

## 2015-02-17 MED ORDER — PNEUMOCOCCAL 13-VAL CONJ VACC IM SUSP: 0.5000 mL | INTRAMUSCULAR | Status: DC

## 2015-02-17 MED ORDER — VARICELLA-ZOSTER IMMUNE GLOB 125 UNIT/1.2ML IM SOLN: 1.2000 mL | Freq: Once | INTRAMUSCULAR | Status: DC

## 2015-02-17 NOTE — Telephone Encounter (Signed)
Immunizations for Prevnar and shingles were sent to [facility]. As requested by pt.

## 2015-02-28 ENCOUNTER — Telehealth: Payer: Self-pay | Admitting: *Deleted

## 2015-02-28 MED ORDER — ZOSTER VACCINE LIVE 19400 UNT/0.65ML ~~LOC~~ SOLR: 0.6500 mL | Freq: Once | SUBCUTANEOUS | Status: DC

## 2015-02-28 NOTE — Telephone Encounter (Signed)
Courtney Russell receive call from pharmacist wrong immunization was sent in. Pt is needing zostavax. Inform pharmacist will resend...[name]

## 2015-04-12 LAB — HM MAMMOGRAPHY

## 2015-04-19 ENCOUNTER — Encounter: Payer: Self-pay | Admitting: Internal Medicine

## 2015-09-27 ENCOUNTER — Other Ambulatory Visit (INDEPENDENT_AMBULATORY_CARE_PROVIDER_SITE_OTHER): Payer: Medicare Other

## 2015-09-27 ENCOUNTER — Encounter: Payer: Self-pay | Admitting: Family

## 2015-09-27 ENCOUNTER — Ambulatory Visit (INDEPENDENT_AMBULATORY_CARE_PROVIDER_SITE_OTHER): Payer: Medicare Other | Admitting: Family

## 2015-09-27 ENCOUNTER — Telehealth: Payer: Self-pay | Admitting: Family

## 2015-09-27 VITALS — BP 146/96 | HR 52 | Temp 98.2°F | Resp 16 | Ht 64.0 in | Wt 169.0 lb

## 2015-09-27 DIAGNOSIS — Z0001 Encounter for general adult medical examination with abnormal findings: Secondary | ICD-10-CM | POA: Insufficient documentation

## 2015-09-27 DIAGNOSIS — I10 Essential (primary) hypertension: Secondary | ICD-10-CM

## 2015-09-27 DIAGNOSIS — R3915 Urgency of urination: Secondary | ICD-10-CM

## 2015-09-27 DIAGNOSIS — Z0000 Encounter for general adult medical examination without abnormal findings: Secondary | ICD-10-CM | POA: Diagnosis not present

## 2015-09-27 LAB — LIPID PANEL
Cholesterol: 203 mg/dL — ABNORMAL HIGH (ref 0–200)
HDL: 53.5 mg/dL (ref >39.00)
LDL Cholesterol: 124 mg/dL — ABNORMAL HIGH (ref 0–99)
NonHDL: 149.33
TRIGLYCERIDES: 128 mg/dL (ref 0.0–149.0)
Total CHOL/HDL Ratio: 4
VLDL: 25.6 mg/dL (ref 0.0–40.0)

## 2015-09-27 LAB — TSH: TSH: 0.27 u[IU]/mL — AB (ref 0.35–4.50)

## 2015-09-27 LAB — POCT URINALYSIS DIPSTICK
Bilirubin, UA: NEGATIVE
Glucose, UA: NEGATIVE
Ketones, UA: NEGATIVE
Leukocytes, UA: NEGATIVE
NITRITE UA: NEGATIVE
PROTEIN UA: NEGATIVE
RBC UA: NEGATIVE
SPEC GRAV UA: 1.015
UROBILINOGEN UA: NEGATIVE
pH, UA: 5

## 2015-09-27 LAB — COMPREHENSIVE METABOLIC PANEL
ALK PHOS: 81 U/L (ref 39–117)
ALT: 41 U/L — ABNORMAL HIGH (ref 0–35)
AST: 57 U/L — ABNORMAL HIGH (ref 0–37)
Albumin: 4.1 g/dL (ref 3.5–5.2)
BUN: 10 mg/dL (ref 6–23)
CO2: 29 mEq/L (ref 19–32)
Calcium: 10.5 mg/dL (ref 8.4–10.5)
Chloride: 106 mEq/L (ref 96–112)
Creatinine, Ser: 0.8 mg/dL (ref 0.40–1.20)
GFR: 75.79 mL/min (ref >60.00)
GLUCOSE: 131 mg/dL — AB (ref 70–99)
POTASSIUM: 4.8 meq/L (ref 3.5–5.1)
Sodium: 139 mEq/L (ref 135–145)
TOTAL PROTEIN: 7.4 g/dL (ref 6.0–8.3)
Total Bilirubin: 1.2 mg/dL (ref 0.2–1.2)

## 2015-09-27 LAB — CBC
HEMATOCRIT: 43.2 % (ref 36.0–46.0)
Hemoglobin: 14.8 g/dL (ref 12.0–15.0)
MCHC: 34.1 g/dL (ref 30.0–36.0)
MCV: 89.2 fl (ref 78.0–100.0)
Platelets: 312 10*3/uL (ref 150.0–400.0)
RBC: 4.85 Mil/uL (ref 3.87–5.11)
RDW: 14.2 % (ref 11.5–15.5)
WBC: 7.9 10*3/uL (ref 4.0–10.5)

## 2015-09-27 LAB — HEPATITIS C ANTIBODY: HCV Ab: NEGATIVE

## 2015-09-27 LAB — HEMOGLOBIN A1C: Hgb A1c MFr Bld: 6.5 % (ref 4.6–6.5)

## 2015-09-27 MED ORDER — AMLODIPINE BESYLATE 5 MG PO TABS: 5.0000 mg | ORAL_TABLET | Freq: Every day | ORAL | Status: DC

## 2015-09-27 MED ORDER — SULFAMETHOXAZOLE-TRIMETHOPRIM 800-160 MG PO TABS: 1.0000 | ORAL_TABLET | Freq: Two times a day (BID) | ORAL | Status: DC

## 2015-09-27 NOTE — Assessment & Plan Note (Signed)
In office urinalysis positive for leukocytes and negative for nitrites and hematuria. Symptoms consistent with urinary tract infection. Start Bactrim. Encouraged fluids. Urine sent for culture. Follow-up if symptoms worsen or fail to improve.

## 2015-09-27 NOTE — Progress Notes (Signed)
Pre visit review using our clinic review tool, if applicable. No additional management support is needed unless otherwise documented below in the visit note. 

## 2015-09-27 NOTE — Progress Notes (Signed)
Subjective:    Patient ID: Courtney Russell, female    DOB: 03/18/1946, 70 y.o.   MRN: [id number]  Chief Complaint  Patient presents with  . CPE    Fasting, thinks she has a UTI wants a UA done     HPI:  Courtney Russell is a 70 y.o. female who presents today for an annual wellness visit.   1) Health Maintenance -   Diet - Averages about 3 meals per consisting of chicken, occasional beef, some fish, and fruits and vegetables; Denies caffeine intake.   Exercise - Walks occasionally   2) Preventative Exams / Immunizations:  Dental -- Due for exam   Vision -- Up to date   Health Maintenance  Topic Date Due  . Hepatitis C Screening  Dec 29, 1945  . COLONOSCOPY  08/13/1997  . DEXA SCAN  08/13/2012  . FOOT EXAM  09/21/2014  . OPHTHALMOLOGY EXAM  12/23/2014  . HEMOGLOBIN A1C  03/22/2015  . URINE MICROALBUMIN  09/22/2015  . INFLUENZA VACCINE  02/28/2016  . MAMMOGRAM  04/11/2017  . TETANUS/TDAP  04/22/2019  . ZOSTAVAX  Completed  . PNA vac Low Risk Adult  Completed     Immunization History  Administered Date(s) Administered  . Influenza-Unspecified 04/21/2013, 04/29/2014  . Pneumococcal Polysaccharide-23 09/18/2012  . Td 04/21/2009   3.) UTI symptoms - This is a new problem. Associated symptom of urinary frequency and pressure that has been going on for about 1 week. Denies fevers, chills, back pain or dysuria. Modifying factors include an OTC medication which did not seem to help very much.   RISK FACTORS  Tobacco History  Smoking status  . Never Smoker   Smokeless tobacco  . Never Used     Cardiac risk factors: advanced age (older than 47 for men, 14 for women), dyslipidemia, hypertension and sedentary lifestyle.  Depression Screen  Q1: Over the past two weeks, have you felt down, depressed or hopeless? No  Q2: Over the past two weeks, have you felt little interest or pleasure in doing things? No  Have you lost interest or pleasure in daily life? No  Do  you often feel hopeless? No  Do you cry easily over simple problems? No  Activities of Daily Living In your present state of health, do you have any difficulty performing the following activities?:  Driving? No Managing money?  No Feeding yourself? No Getting from bed to chair? No Climbing a flight of stairs? No Preparing food and eating?: No Bathing or showering? No Getting dressed: No Getting to the toilet? No Using the toilet: No Moving around from place to place: No In the past year have you fallen or had a near fall?:No   Home Safety Has smoke detector and wears seat belts. No firearms. No excess sun exposure. Are there smokers in your home (other than you)?  No Do you feel safe at home?  Yes  Hearing Difficulties: No Do you often ask people to speak up or repeat themselves? No Do you experience ringing or noises in your ears? No  Do you have difficulty understanding soft or whispered voices? No    Cognitive Testing  Alert? Yes   Normal Appearance? Yes  Oriented to person? Yes  Place? Yes   Time? Yes  Recall of three objects?  Yes  Can perform simple calculations? Yes  Displays appropriate judgment? Yes  Can read the correct time from a watch face? Yes  Do you feel that you have a problem  with memory? No  Do you often misplace items? No   Advanced Directives have been discussed with the patient? Yes Current Physicians/Providers and Suppliers  1. Terri Piedra, FNP - Internal medicine   Indicate any recent Medical Services you may have received from other than [facility] providers in the past year (date may be approximate).  All answers were reviewed with the patient and necessary referrals were made:  Mauricio Po, FNP   09/27/2015    Allergies  Allergen Reactions  . Fish Oil     REACTION: rash  . Codeine     Mental status changes  . Crestor [Rosuvastatin]     Elevated LFTs  . Vytorin [Ezetimibe-Simvastatin]     Elevated LFTs  . Hydrochlorothiazide      REACTION: low potassium level     Outpatient Prescriptions Prior to Visit  Medication Sig Dispense Refill  . aspirin 81 MG tablet Take 81 mg by mouth 1 dose over 46 hours.      . cloNIDine (CATAPRES) 0.1 MG tablet Take 1 tablet (0.1 mg total) by mouth 2 (two) times daily. 180 tablet 1  . esomeprazole (NEXIUM) 40 MG capsule Take 1 capsule (40 mg total) by mouth daily. 90 capsule 1  . ezetimibe (ZETIA) 10 MG tablet Take 1 tablet (10 mg total) by mouth daily. 90 tablet 1  . glucose blood test strip 1 each by Other route as needed. Use as instructed     . Lancets (FREESTYLE) lancets 1 each by Other route as needed. Use as instructed     . metoprolol (LOPRESSOR) 100 MG tablet TAKE ONE-HALF TABLET BY MOUTH TWICE DAILY 90 tablet 1  . pneumococcal 13-valent conjugate vaccine (PREVNAR 13) SUSP injection Inject 0.5 mLs into the muscle tomorrow at 10 am. 0.5 mL 0  . Varicella-Zoster Immune Glob 125 UNIT/1.2ML SOLN Inject 1.2 mLs into the skin once. 1.2 mL 0  . zoster vaccine live, PF, (ZOSTAVAX) 16109 UNT/0.65ML injection Inject 19,400 Units into the skin once. 1 each 0   No facility-administered medications prior to visit.     Past Medical History  Diagnosis Date  . Hypertension   . High cholesterol   . IBS (irritable bowel syndrome)   . Palpitations   . GERD (gastroesophageal reflux disease)   . Prediabetes      Past Surgical History  Procedure Laterality Date  . Bilateral salpingoophorectomy  1996    cysts  . Tubal ligation    . Tonsillectomy    . Cholecystectomy    . Colonoscopy  1998, 2008    negative;Dr Olevia Perches  . Abdominal hysterectomy  1992    fibroids  . Breast surgery      duct excision     Family History  Problem Relation Age of Onset  . Cancer Father     COLON & PROSTATE  . Diabetes Father   . Alzheimer's disease Father   . Heart attack Father     MI in late 20s; CABG @ 63  . Dementia Mother   . Transient ischemic attack Mother     TIAs; AVM leaked in 66s  .  Colon polyps Mother   . Breast cancer Sister   . Coronary artery disease Sister     stent @ 13  . Diabetes Paternal Aunt   . Stroke Father     in 73s     Social History   Social History  . Marital Status: Married    Spouse Name: N/A  . Number of  Children: 3  . Years of Education: 13   Occupational History  . Retired    Social History Main Topics  . Smoking status: Never Smoker   . Smokeless tobacco: Never Used  . Alcohol Use: No  . Drug Use: No  . Sexual Activity: Not on file   Other Topics Concern  . Not on file   Social History Narrative   Fun: Travel and read   Denies abuse and feels safe at home.      Review of Systems  Constitutional: Denies fever, chills, fatigue, or significant weight gain/loss. HENT: Head: Denies headache or neck pain Ears: Denies changes in hearing, ringing in ears, earache, drainage Nose: Denies discharge, stuffiness, itching, nosebleed, sinus pain Throat: Denies sore throat, hoarseness, dry mouth, sores, thrush Eyes: Denies loss/changes in vision, pain, redness, blurry/double vision, flashing lights Cardiovascular: Denies chest pain/discomfort, tightness, palpitations, shortness of breath with activity, difficulty lying down, swelling, sudden awakening with shortness of breath Respiratory: Denies shortness of breath, cough, sputum production, wheezing Gastrointestinal: Denies dysphasia, heartburn, change in appetite, nausea, change in bowel habits, rectal bleeding, constipation, diarrhea, yellow skin or eyes Genitourinary: Denies frequency, urgency, burning/pain, blood in urine, incontinence, change in urinary strength. Musculoskeletal: Denies muscle/joint pain, stiffness, back pain, redness or swelling of joints, trauma Skin: Denies rashes, lumps, itching, dryness, color changes, or hair/nail changes Neurological: Denies dizziness, fainting, seizures, weakness, numbness, tingling, tremor Psychiatric - Denies nervousness, stress,  depression or memory loss Endocrine: Denies heat or cold intolerance, sweating, frequent urination, excessive thirst, changes in appetite Hematologic: Denies ease of bruising or bleeding    Objective:     BP 146/96 mmHg  Pulse 52  Temp(Src) 98.2 F (36.8 C) (Oral)  Resp 16  Ht 5\' 4"  (1.626 m)  Wt 169 lb (76.658 kg)  BMI 28.99 kg/m2  SpO2 97% Nursing note and vital signs reviewed.  Physical Exam  Constitutional: She is oriented to person, place, and time. She appears well-developed and well-nourished.  HENT:  Head: Normocephalic.  Right Ear: Hearing, tympanic membrane, external ear and ear canal normal.  Left Ear: Hearing, tympanic membrane, external ear and ear canal normal.  Nose: Nose normal.  Mouth/Throat: Uvula is midline, oropharynx is clear and moist and mucous membranes are normal.  Eyes: Conjunctivae and EOM are normal. Pupils are equal, round, and reactive to light.  Neck: Neck supple. No JVD present. No tracheal deviation present. No thyromegaly present.  Cardiovascular: Normal rate, regular rhythm, normal heart sounds and intact distal pulses.   Pulmonary/Chest: Effort normal and breath sounds normal.  Abdominal: Soft. Bowel sounds are normal. She exhibits no distension and no mass. There is no tenderness. There is no rebound, no guarding and no CVA tenderness.  Musculoskeletal: Normal range of motion. She exhibits no edema or tenderness.  Lymphadenopathy:    She has no cervical adenopathy.  Neurological: She is alert and oriented to person, place, and time. She has normal reflexes. No cranial nerve deficit. She exhibits normal muscle tone. Coordination normal.  Skin: Skin is warm and dry.  Psychiatric: She has a normal mood and affect. Her behavior is normal. Judgment and thought content normal.       Assessment & Plan:   During the course of the visit the patient was educated and counseled about appropriate screening and preventive services including:     Pneumococcal vaccine   Influenza vaccine  Colorectal cancer screening  Diabetes screening  Diet review for nutrition referral? Yes ____  Not Indicated _X___  Patient Instructions (the written plan) was given to the patient.  Medicare Attestation I have personally reviewed: The patient's medical and social history Their use of alcohol, tobacco or illicit drugs Their current medications and supplements The patient's functional ability including ADLs,fall risks, home safety risks, cognitive, and hearing and visual impairment Diet and physical activities Evidence for depression or mood disorders  The patient's weight, height, BMI,  have been recorded in the chart.  I have made referrals, counseling, and provided education to the patient based on review of the above and I have provided the patient with a written personalized care plan for preventive services.     Mauricio Po, [place]   09/27/2015    Problem List Items Addressed This Visit      Cardiovascular and Mediastinum   Essential hypertension   Relevant Medications   amLODipine (NORVASC) 5 MG tablet     Other   Routine general medical examination at a health care facility    1) Anticipatory Guidance: Discussed importance of wearing a seatbelt while driving and not texting while driving; changing batteries in smoke detector at least once annually; wearing suntan lotion when outside; eating a balanced and moderate diet; getting physical activity at least 30 minutes per day.  2) Immunizations / Screenings / Labs:  All immunizations are up-to-date per recommendations. Due for a dental screening encouraged to be completed independently. Discussed bone mineral density which she will check her last date. Obtain hepatitis C antibody for hepatitis C screening. Obtain hemoglobin A1c for prediabetes check. Obtain Cologuard for colon cancer screening. All other screenings are up-to-date per recommendations. Obtain CBC, CMET, Lipid  profile and TSH.   Overall well exam with risk factors for cardiovascular disease including overweight, hyperlipidemia, and hypertension. Chronic conditions are currently managed with medications and will be checked with blood work. Discussed importance of increasing physical activity to 30 minutes of moderate level activity daily. Goal weight loss of 5-10% of current body weight. Encourage nutritional intake that is balanced, varied, and moderate and emphasizes nutrient dense foods and is low in saturated fats and sugars/processed foods. Continue other healthy lifestyle behaviors and choices. Follow-up prevention exam in 1 year. Follow-up office visit in 3 weeks for blood pressure check and chronic conditions as needed following blood work.       Relevant Orders   CBC   Comprehensive metabolic panel   TSH   Lipid panel   Hepatitis C antibody   Hemoglobin A1c   Medicare annual wellness visit, subsequent - Primary    Reviewed and updated patient's medical, surgical, family and social history. Medications and allergies were also reviewed. Basic screenings for depression, activities of daily living, hearing, cognition and safety were performed. Provider list was updated and health plan was provided to the patient.       Urinary urgency    In office urinalysis positive for leukocytes and negative for nitrites and hematuria. Symptoms consistent with urinary tract infection. Start Bactrim. Encouraged fluids. Urine sent for culture. Follow-up if symptoms worsen or fail to improve.      Relevant Medications   sulfamethoxazole-trimethoprim (BACTRIM DS,SEPTRA DS) 800-160 MG tablet   Other Relevant Orders   POCT urinalysis dipstick (Completed)   Urine culture

## 2015-09-27 NOTE — Patient Instructions (Signed)
Thank you for choosing [facility].  Summary/Instructions:  Your prescription(s) have been submitted to your pharmacy or been printed and provided for you. Please take as directed and contact our office if you believe you are having problem(s) with the medication(s) or have any questions.  Please stop by the lab on the basement level of the building for your blood work. Your results will be released to [place] (or called to you) after review, usually within 72 hours after test completion. If any changes need to be made, you will be notified at that same time.  If your symptoms worsen or fail to improve, please contact our office for further instruction, or in case of emergency go directly to the emergency room at the closest medical facility.   Health Maintenance  Topic Date Due  . Hepatitis C Screening  1945/09/01  . COLONOSCOPY  08/13/1997  . DEXA SCAN  08/13/2012  . FOOT EXAM  09/21/2014  . OPHTHALMOLOGY EXAM  12/23/2014  . HEMOGLOBIN A1C  03/22/2015  . URINE MICROALBUMIN  09/22/2015  . INFLUENZA VACCINE  02/28/2016  . MAMMOGRAM  04/11/2017  . TETANUS/TDAP  04/22/2019  . ZOSTAVAX  Completed  . PNA vac Low Risk Adult  Completed   Health Maintenance, Female Adopting a healthy lifestyle and getting preventive care can go a long way to promote health and wellness. Talk with your health care provider about what schedule of regular examinations is right for you. This is a good chance for you to check in with your provider about disease prevention and staying healthy. In between checkups, there are plenty of things you can do on your own. Experts have done a lot of research about which lifestyle changes and preventive measures are most likely to keep you healthy. Ask your health care provider for more information. WEIGHT AND DIET  Eat a healthy diet  Be sure to include plenty of vegetables, fruits, low-fat dairy products, and lean protein.  Do not eat a lot of foods high in solid  fats, added sugars, or salt.  Get regular exercise. This is one of the most important things you can do for your health.  Most adults should exercise for at least 150 minutes each week. The exercise should increase your heart rate and make you sweat (moderate-intensity exercise).  Most adults should also do strengthening exercises at least twice a week. This is in addition to the moderate-intensity exercise.  Maintain a healthy weight  Body mass index (BMI) is a measurement that can be used to identify possible weight problems. It estimates body fat based on height and weight. Your health care provider can help determine your BMI and help you achieve or maintain a healthy weight.  For females 22 years of age and older:   A BMI below 18.5 is considered underweight.  A BMI of 18.5 to 24.9 is normal.  A BMI of 25 to 29.9 is considered overweight.  A BMI of 30 and above is considered obese.  Watch levels of cholesterol and blood lipids  You should start having your blood tested for lipids and cholesterol at 68 years of age, then have this test every 5 years.  You may need to have your cholesterol levels checked more often if:  Your lipid or cholesterol levels are high.  You are older than 68 years of age.  You are at high risk for heart disease.  CANCER SCREENING   Lung Cancer  Lung cancer screening is recommended for adults 72-50 years old  who are at high risk for lung cancer because of a history of smoking.  A yearly low-dose CT scan of the lungs is recommended for people who:  Currently smoke.  Have quit within the past 15 years.  Have at least a 30-pack-year history of smoking. A pack year is smoking an average of one pack of cigarettes a day for 1 year.  Yearly screening should continue until it has been 15 years since you quit.  Yearly screening should stop if you develop a health problem that would prevent you from having lung cancer treatment.  Breast  Cancer  Practice breast self-awareness. This means understanding how your breasts normally appear and feel.  It also means doing regular breast self-exams. Let your health care provider know about any changes, no matter how small.  If you are in your 20s or 30s, you should have a clinical breast exam (CBE) by a health care provider every 1-3 years as part of a regular health exam.  If you are 82 or older, have a CBE every year. Also consider having a breast X-ray (mammogram) every year.  If you have a family history of breast cancer, talk to your health care provider about genetic screening.  If you are at high risk for breast cancer, talk to your health care provider about having an MRI and a mammogram every year.  Breast cancer gene (BRCA) assessment is recommended for women who have family members with BRCA-related cancers. BRCA-related cancers include:  Breast.  Ovarian.  Tubal.  Peritoneal cancers.  Results of the assessment will determine the need for genetic counseling and BRCA1 and BRCA2 testing. Cervical Cancer Your health care provider may recommend that you be screened regularly for cancer of the pelvic organs (ovaries, uterus, and vagina). This screening involves a pelvic examination, including checking for microscopic changes to the surface of your cervix (Pap test). You may be encouraged to have this screening done every 3 years, beginning at age 29.  For women ages 35-65, health care providers may recommend pelvic exams and Pap testing every 3 years, or they may recommend the Pap and pelvic exam, combined with testing for human papilloma virus (HPV), every 5 years. Some types of HPV increase your risk of cervical cancer. Testing for HPV may also be done on women of any age with unclear Pap test results.  Other health care providers may not recommend any screening for nonpregnant women who are considered low risk for pelvic cancer and who do not have symptoms. Ask your  health care provider if a screening pelvic exam is right for you.  If you have had past treatment for cervical cancer or a condition that could lead to cancer, you need Pap tests and screening for cancer for at least 20 years after your treatment. If Pap tests have been discontinued, your risk factors (such as having a new sexual partner) need to be reassessed to determine if screening should resume. Some women have medical problems that increase the chance of getting cervical cancer. In these cases, your health care provider may recommend more frequent screening and Pap tests. Colorectal Cancer  This type of cancer can be detected and often prevented.  Routine colorectal cancer screening usually begins at 68 years of age and continues through 68 years of age.  Your health care provider may recommend screening at an earlier age if you have risk factors for colon cancer.  Your health care provider may also recommend using home test kits to check  for hidden blood in the stool.  A small camera at the end of a tube can be used to examine your colon directly (sigmoidoscopy or colonoscopy). This is done to check for the earliest forms of colorectal cancer.  Routine screening usually begins at age 60.  Direct examination of the colon should be repeated every 5-10 years through 68 years of age. However, you may need to be screened more often if early forms of precancerous polyps or small growths are found. Skin Cancer  Check your skin from head to toe regularly.  Tell your health care provider about any new moles or changes in moles, especially if there is a change in a mole's shape or color.  Also tell your health care provider if you have a mole that is larger than the size of a pencil eraser.  Always use sunscreen. Apply sunscreen liberally and repeatedly throughout the day.  Protect yourself by wearing long sleeves, pants, a wide-brimmed hat, and sunglasses whenever you are outside. HEART  DISEASE, DIABETES, AND HIGH BLOOD PRESSURE   High blood pressure causes heart disease and increases the risk of stroke. High blood pressure is more likely to develop in:  People who have blood pressure in the high end of the normal range (130-139/85-89 mm Hg).  People who are overweight or obese.  People who are African American.  If you are 40-24 years of age, have your blood pressure checked every 3-5 years. If you are 39 years of age or older, have your blood pressure checked every year. You should have your blood pressure measured twice--once when you are at a hospital or clinic, and once when you are not at a hospital or clinic. Record the average of the two measurements. To check your blood pressure when you are not at a hospital or clinic, you can use:  An automated blood pressure machine at a pharmacy.  A home blood pressure monitor.  If you are between 30 years and 75 years old, ask your health care provider if you should take aspirin to prevent strokes.  Have regular diabetes screenings. This involves taking a blood sample to check your fasting blood sugar level.  If you are at a normal weight and have a low risk for diabetes, have this test once every three years after 68 years of age.  If you are overweight and have a high risk for diabetes, consider being tested at a younger age or more often. PREVENTING INFECTION  Hepatitis B  If you have a higher risk for hepatitis B, you should be screened for this virus. You are considered at high risk for hepatitis B if:  You were born in a country where hepatitis B is common. Ask your health care provider which countries are considered high risk.  Your parents were born in a high-risk country, and you have not been immunized against hepatitis B (hepatitis B vaccine).  You have HIV or AIDS.  You use needles to inject street drugs.  You live with someone who has hepatitis B.  You have had sex with someone who has hepatitis  B.  You get hemodialysis treatment.  You take certain medicines for conditions, including cancer, organ transplantation, and autoimmune conditions. Hepatitis C  Blood testing is recommended for:  Everyone born from [birth year] through 1963.  Anyone with known risk factors for hepatitis C. Sexually transmitted infections (STIs)  You should be screened for sexually transmitted infections (STIs) including gonorrhea and chlamydia if:  You are sexually active  and are younger than 68 years of age.  You are older than 68 years of age and your health care provider tells you that you are at risk for this type of infection.  Your sexual activity has changed since you were last screened and you are at an increased risk for chlamydia or gonorrhea. Ask your health care provider if you are at risk.  If you do not have HIV, but are at risk, it may be recommended that you take a prescription medicine daily to prevent HIV infection. This is called pre-exposure prophylaxis (PrEP). You are considered at risk if:  You are sexually active and do not regularly use condoms or know the HIV status of your partner(s).  You take drugs by injection.  You are sexually active with a partner who has HIV. Talk with your health care provider about whether you are at high risk of being infected with HIV. If you choose to begin PrEP, you should first be tested for HIV. You should then be tested every 3 months for as long as you are taking PrEP.  PREGNANCY   If you are premenopausal and you may become pregnant, ask your health care provider about preconception counseling.  If you may become pregnant, take 400 to 800 micrograms (mcg) of folic acid every day.  If you want to prevent pregnancy, talk to your health care provider about birth control (contraception). OSTEOPOROSIS AND MENOPAUSE   Osteoporosis is a disease in which the bones lose minerals and strength with aging. This can result in serious bone fractures. Your  risk for osteoporosis can be identified using a bone density scan.  If you are 67 years of age or older, or if you are at risk for osteoporosis and fractures, ask your health care provider if you should be screened.  Ask your health care provider whether you should take a calcium or vitamin D supplement to lower your risk for osteoporosis.  Menopause may have certain physical symptoms and risks.  Hormone replacement therapy may reduce some of these symptoms and risks. Talk to your health care provider about whether hormone replacement therapy is right for you.  HOME CARE INSTRUCTIONS   Schedule regular health, dental, and eye exams.  Stay current with your immunizations.   Do not use any tobacco products including cigarettes, chewing tobacco, or electronic cigarettes.  If you are pregnant, do not drink alcohol.  If you are breastfeeding, limit how much and how often you drink alcohol.  Limit alcohol intake to no more than 1 drink per day for nonpregnant women. One drink equals 12 ounces of beer, 5 ounces of wine, or 1 ounces of hard liquor.  Do not use street drugs.  Do not share needles.  Ask your health care provider for help if you need support or information about quitting drugs.  Tell your health care provider if you often feel depressed.  Tell your health care provider if you have ever been abused or do not feel safe at home.   This information is not intended to replace advice given to you by your health care provider. Make sure you discuss any questions you have with your health care provider.   Document Released: 01/29/2011 Document Revised: 08/06/2014 Document Reviewed: 06/17/2013 Elsevier Interactive Patient Education 2016 Elsevier Inc.  Urinary Tract Infection Urinary tract infections (UTIs) can develop anywhere along your urinary tract. Your urinary tract is your body's drainage system for removing wastes and extra water. Your urinary tract includes two  kidneys, two ureters, a bladder, and a urethra. Your kidneys are a pair of bean-shaped organs. Each kidney is about the size of your fist. They are located below your ribs, one on each side of your spine. CAUSES Infections are caused by microbes, which are microscopic organisms, including fungi, viruses, and bacteria. These organisms are so small that they can only be seen through a microscope. Bacteria are the microbes that most commonly cause UTIs. SYMPTOMS  Symptoms of UTIs may vary by age and gender of the patient and by the location of the infection. Symptoms in young women typically include a frequent and intense urge to urinate and a painful, burning feeling in the bladder or urethra during urination. Older women and men are more likely to be tired, shaky, and weak and have muscle aches and abdominal pain. A fever may mean the infection is in your kidneys. Other symptoms of a kidney infection include pain in your back or sides below the ribs, nausea, and vomiting. DIAGNOSIS To diagnose a UTI, your caregiver will ask you about your symptoms. Your caregiver will also ask you to provide a urine sample. The urine sample will be tested for bacteria and white blood cells. White blood cells are made by your body to help fight infection. TREATMENT  Typically, UTIs can be treated with medication. Because most UTIs are caused by a bacterial infection, they usually can be treated with the use of antibiotics. The choice of antibiotic and length of treatment depend on your symptoms and the type of bacteria causing your infection. HOME CARE INSTRUCTIONS  If you were prescribed antibiotics, take them exactly as your caregiver instructs you. Finish the medication even if you feel better after you have only taken some of the medication.  Drink enough water and fluids to keep your urine clear or pale yellow.  Avoid caffeine, tea, and carbonated beverages. They tend to irritate your bladder.  Empty your bladder  often. Avoid holding urine for long periods of time.  Empty your bladder before and after sexual intercourse.  After a bowel movement, women should cleanse from front to back. Use each tissue only once. SEEK MEDICAL CARE IF:   You have back pain.  You develop a fever.  Your symptoms do not begin to resolve within 3 days. SEEK IMMEDIATE MEDICAL CARE IF:   You have severe back pain or lower abdominal pain.  You develop chills.  You have nausea or vomiting.  You have continued burning or discomfort with urination. MAKE SURE YOU:   Understand these instructions.  Will watch your condition.  Will get help right away if you are not doing well or get worse.   This information is not intended to replace advice given to you by your health care provider. Make sure you discuss any questions you have with your health care provider.   Document Released: 04/25/2005 Document Revised: 04/06/2015 Document Reviewed: 08/24/2011 Elsevier Interactive Patient Education [employer].

## 2015-09-27 NOTE — Telephone Encounter (Signed)
Yes, that is fine. 

## 2015-09-27 NOTE — Telephone Encounter (Signed)
Is it ok for year refill for the listed medication

## 2015-09-27 NOTE — Assessment & Plan Note (Signed)
Reviewed and updated patient's medical, surgical, family and social history. Medications and allergies were also reviewed. Basic screenings for depression, activities of daily living, hearing, cognition and safety were performed. Provider list was updated and health plan was provided to the patient.  

## 2015-09-27 NOTE — Assessment & Plan Note (Addendum)
1) Anticipatory Guidance: Discussed importance of wearing a seatbelt while driving and not texting while driving; changing batteries in smoke detector at least once annually; wearing suntan lotion when outside; eating a balanced and moderate diet; getting physical activity at least 30 minutes per day.  2) Immunizations / Screenings / Labs:  All immunizations are up-to-date per recommendations. Due for a dental screening encouraged to be completed independently. Discussed bone mineral density which she will check her last date. Obtain hepatitis C antibody for hepatitis C screening. Obtain hemoglobin A1c for prediabetes check. Obtain Cologuard for colon cancer screening. All other screenings are up-to-date per recommendations. Obtain CBC, CMET, Lipid profile and TSH.   Overall well exam with risk factors for cardiovascular disease including overweight, hyperlipidemia, and hypertension. Chronic conditions are currently managed with medications and will be checked with blood work. Discussed importance of increasing physical activity to 30 minutes of moderate level activity daily. Goal weight loss of 5-10% of current body weight. Encourage nutritional intake that is balanced, varied, and moderate and emphasizes nutrient dense foods and is low in saturated fats and sugars/processed foods. Continue other healthy lifestyle behaviors and choices. Follow-up prevention exam in 1 year. Follow-up office visit in 3 weeks for blood pressure check and chronic conditions as needed following blood work.

## 2015-09-27 NOTE — Telephone Encounter (Signed)
Patient is requesting 90 day supplies with refills for a year on metoprolol, ezetimibe, clonidine and esomeprazole.  Patient uses [facility] in [place] .

## 2015-09-28 MED ORDER — METOPROLOL TARTRATE 100 MG PO TABS: ORAL_TABLET | ORAL | Status: DC

## 2015-09-28 MED ORDER — CLONIDINE HCL 0.1 MG PO TABS: 0.1000 mg | ORAL_TABLET | Freq: Two times a day (BID) | ORAL | Status: DC

## 2015-09-28 MED ORDER — EZETIMIBE 10 MG PO TABS: 10.0000 mg | ORAL_TABLET | Freq: Every day | ORAL | Status: DC

## 2015-09-28 MED ORDER — ESOMEPRAZOLE MAGNESIUM 40 MG PO CPDR: 40.0000 mg | DELAYED_RELEASE_CAPSULE | Freq: Every day | ORAL | Status: DC

## 2015-09-28 NOTE — Telephone Encounter (Signed)
All medications have been sent. 

## 2015-09-28 NOTE — Telephone Encounter (Signed)
Patient has called back regarding the 90 day supply of the prescriptions below. She is out of one of them.  Can you please send these over to [facility] in [place] this afternoon

## 2015-09-29 ENCOUNTER — Encounter: Payer: Self-pay | Admitting: Family

## 2015-09-29 LAB — URINE CULTURE

## 2015-10-13 LAB — COLOGUARD: COLOGUARD: NEGATIVE

## 2015-10-26 ENCOUNTER — Encounter: Payer: Self-pay | Admitting: Family

## 2016-02-14 DIAGNOSIS — R011 Cardiac murmur, unspecified: Secondary | ICD-10-CM | POA: Insufficient documentation

## 2016-02-16 DIAGNOSIS — N819 Female genital prolapse, unspecified: Secondary | ICD-10-CM | POA: Insufficient documentation

## 2016-05-04 ENCOUNTER — Encounter: Payer: Self-pay | Admitting: Family

## 2016-05-04 LAB — HM MAMMOGRAPHY

## 2016-05-09 ENCOUNTER — Encounter: Payer: Self-pay | Admitting: Family

## 2016-05-22 ENCOUNTER — Telehealth: Payer: Self-pay | Admitting: Emergency Medicine

## 2016-05-22 NOTE — Telephone Encounter (Signed)
Tried calling pt. No answer or VM. Will try back later.

## 2016-05-22 NOTE — Telephone Encounter (Signed)
Please inform patient that her bone density test shows that she has low bone mineral density call osteopenia. The recommendation is for supplementation of calcium and Vitamin D with continued weight bearing exercises. Recommended dose of calcium is 1200 mg daily and Vitamin D of 800 IU.

## 2016-05-22 NOTE — Telephone Encounter (Signed)
Pt called and stated she had a Bone Density test done and wanted to know if you can call her with the results. Thanks.

## 2016-05-24 ENCOUNTER — Telehealth: Payer: Self-pay | Admitting: Family

## 2016-05-24 NOTE — Telephone Encounter (Signed)
Patient is calling about her bone density results. She states she has called a few times about this. It was done on oct 6th. Can you please follow up with patient. Thank you.

## 2016-05-25 NOTE — Telephone Encounter (Signed)
Pt aware of results. Was wanting to see about any alternatives. States mom had to take something and it was a pill once a month. Do you happen to know what that is? States calcium does not agree with her and it makes her very constipated. Please advise

## 2016-05-25 NOTE — Telephone Encounter (Signed)
Pt aware of results 

## 2016-05-27 NOTE — Telephone Encounter (Signed)
Has she tried over the counter supplements like Viactiv? More than likely the medication she is referring to is a prescription osteoporosis medication.

## 2016-05-30 NOTE — Telephone Encounter (Signed)
Pt is going to try the OTC supplement first.

## 2016-09-21 ENCOUNTER — Telehealth: Payer: Self-pay

## 2016-09-21 DIAGNOSIS — I10 Essential (primary) hypertension: Secondary | ICD-10-CM

## 2016-09-21 MED ORDER — CLONIDINE HCL 0.1 MG PO TABS: 0.1000 mg | ORAL_TABLET | Freq: Two times a day (BID) | ORAL | 0 refills | Status: DC

## 2016-09-21 MED ORDER — AMLODIPINE BESYLATE 5 MG PO TABS: 5.0000 mg | ORAL_TABLET | Freq: Every day | ORAL | 0 refills | Status: DC

## 2016-09-21 MED ORDER — ESOMEPRAZOLE MAGNESIUM 40 MG PO CPDR: 40.0000 mg | DELAYED_RELEASE_CAPSULE | Freq: Every day | ORAL | 0 refills | Status: DC

## 2016-09-21 MED ORDER — METOPROLOL TARTRATE 100 MG PO TABS
50.0000 mg | ORAL_TABLET | Freq: Two times a day (BID) | ORAL | 0 refills | Status: DC
Start: 2016-09-21 — End: 2016-09-27

## 2016-09-21 MED ORDER — EZETIMIBE 10 MG PO TABS: 10.0000 mg | ORAL_TABLET | Freq: Every day | ORAL | 0 refills | Status: DC

## 2016-09-21 NOTE — Telephone Encounter (Signed)
Pt lmom and rq rf for meds to get her to her upcoming appt. [facility] sent.

## 2016-09-27 ENCOUNTER — Other Ambulatory Visit (INDEPENDENT_AMBULATORY_CARE_PROVIDER_SITE_OTHER): Payer: Medicare Other

## 2016-09-27 ENCOUNTER — Encounter: Payer: Self-pay | Admitting: Family

## 2016-09-27 ENCOUNTER — Ambulatory Visit (INDEPENDENT_AMBULATORY_CARE_PROVIDER_SITE_OTHER): Payer: Medicare Other | Admitting: Family

## 2016-09-27 VITALS — BP 168/98 | HR 51 | Temp 98.6°F | Resp 14 | Ht 64.0 in | Wt 169.0 lb

## 2016-09-27 DIAGNOSIS — E782 Mixed hyperlipidemia: Secondary | ICD-10-CM

## 2016-09-27 DIAGNOSIS — I10 Essential (primary) hypertension: Secondary | ICD-10-CM

## 2016-09-27 DIAGNOSIS — R7303 Prediabetes: Secondary | ICD-10-CM | POA: Diagnosis not present

## 2016-09-27 DIAGNOSIS — Z0000 Encounter for general adult medical examination without abnormal findings: Secondary | ICD-10-CM

## 2016-09-27 DIAGNOSIS — E119 Type 2 diabetes mellitus without complications: Secondary | ICD-10-CM

## 2016-09-27 DIAGNOSIS — E559 Vitamin D deficiency, unspecified: Secondary | ICD-10-CM

## 2016-09-27 LAB — COMPREHENSIVE METABOLIC PANEL
ALBUMIN: 3.9 g/dL (ref 3.5–5.2)
ALT: 45 U/L — ABNORMAL HIGH (ref 0–35)
AST: 52 U/L — ABNORMAL HIGH (ref 0–37)
Alkaline Phosphatase: 76 U/L (ref 39–117)
BUN: 8 mg/dL (ref 6–23)
CHLORIDE: 105 meq/L (ref 96–112)
CO2: 28 mEq/L (ref 19–32)
Calcium: 10 mg/dL (ref 8.4–10.5)
Creatinine, Ser: 0.77 mg/dL (ref 0.40–1.20)
GFR: 78.97 mL/min (ref >60.00)
Glucose, Bld: 143 mg/dL — ABNORMAL HIGH (ref 70–99)
Potassium: 4.5 mEq/L (ref 3.5–5.1)
SODIUM: 137 meq/L (ref 135–145)
Total Bilirubin: 1.1 mg/dL (ref 0.2–1.2)
Total Protein: 7.1 g/dL (ref 6.0–8.3)

## 2016-09-27 LAB — LIPID PANEL
CHOLESTEROL: 206 mg/dL — AB (ref 0–200)
HDL: 49.8 mg/dL (ref >39.00)
LDL CALC: 125 mg/dL — AB (ref 0–99)
NonHDL: 155.98
TRIGLYCERIDES: 156 mg/dL — AB (ref 0.0–149.0)
Total CHOL/HDL Ratio: 4
VLDL: 31.2 mg/dL (ref 0.0–40.0)

## 2016-09-27 LAB — CBC
HEMATOCRIT: 43.8 % (ref 36.0–46.0)
Hemoglobin: 14.8 g/dL (ref 12.0–15.0)
MCHC: 33.8 g/dL (ref 30.0–36.0)
MCV: 91 fl (ref 78.0–100.0)
Platelets: 279 10*3/uL (ref 150.0–400.0)
RBC: 4.82 Mil/uL (ref 3.87–5.11)
RDW: 13.9 % (ref 11.5–15.5)
WBC: 7.6 10*3/uL (ref 4.0–10.5)

## 2016-09-27 LAB — MICROALBUMIN / CREATININE URINE RATIO
Creatinine,U: 97.5 mg/dL
MICROALB/CREAT RATIO: 0.7 mg/g (ref 0.0–30.0)
Microalb, Ur: 0.7 mg/dL (ref 0.0–1.9)

## 2016-09-27 LAB — HEMOGLOBIN A1C: Hgb A1c MFr Bld: 6.8 % — ABNORMAL HIGH (ref 4.6–6.5)

## 2016-09-27 LAB — VITAMIN D 25 HYDROXY (VIT D DEFICIENCY, FRACTURES): VITD: 13.07 ng/mL — AB (ref 30.00–100.00)

## 2016-09-27 MED ORDER — CLONIDINE HCL 0.1 MG PO TABS
0.1000 mg | ORAL_TABLET | Freq: Two times a day (BID) | ORAL | 0 refills | Status: DC
Start: 2016-09-27 — End: 2016-12-28

## 2016-09-27 MED ORDER — EZETIMIBE 10 MG PO TABS: 10.0000 mg | ORAL_TABLET | Freq: Every day | ORAL | 0 refills | Status: DC

## 2016-09-27 MED ORDER — METOPROLOL TARTRATE 100 MG PO TABS: 50.0000 mg | ORAL_TABLET | Freq: Two times a day (BID) | ORAL | 0 refills | Status: DC

## 2016-09-27 MED ORDER — LOSARTAN POTASSIUM 50 MG PO TABS: 50.0000 mg | ORAL_TABLET | Freq: Every day | ORAL | 0 refills | Status: DC

## 2016-09-27 MED ORDER — ESOMEPRAZOLE MAGNESIUM 40 MG PO CPDR: 40.0000 mg | DELAYED_RELEASE_CAPSULE | Freq: Every day | ORAL | 0 refills | Status: DC

## 2016-09-27 NOTE — Assessment & Plan Note (Signed)
Obtain hemoglobin A1c and urine microalbumin. Diabetic foot exam completed today. Pneumovax is up-to-date. Maintained on losartan and for CAD risk reduction. Unable to take statins secondary to intolerance. Diabetic eye exam is up-to-date. Not currently checking blood sugars at home. Continue lifestyle management pending A1c results. Continue to monitor.

## 2016-09-27 NOTE — Patient Instructions (Signed)
Thank you for choosing [facility].  SUMMARY AND INSTRUCTIONS:  Monitor your blood pressure at home and follow-up in 2 weeks for blood pressure readings. Goal is to have average blood pressure below 140/90.  Recommend following a low-sodium diet and avoiding processed/sugary foods in saturated fats.  Increase physical activity as able to goal of 30 minutes of moderate level activity 2-3 times per week.  Medication:  Please stop taking amlodipine.  Start taking losartan.  Continue other medications as prescribed.  Your prescription(s) have been submitted to your pharmacy or been printed and provided for you. Please take as directed and contact our office if you believe you are having problem(s) with the medication(s) or have any questions.  Labs:  Please stop by the lab on the lower level of the building for your blood work. Your results will be released to [place] (or called to you) after review, usually within 72 hours after test completion. If any changes need to be made, you will be notified at that same time.  1.) The lab is open from 7:30am to 5:30 pm Monday-Friday 2.) No appointment is necessary 3.) Fasting (if needed) is 6-8 hours after food and drink; black coffee and water are okay   Follow up:  If your symptoms worsen or fail to improve, please contact our office for further instruction, or in case of emergency go directly to the emergency room at the closest medical facility.   Health Maintenance  Topic Date Due  . FOOT EXAM  09/21/2014  . URINE MICROALBUMIN  09/22/2015  . HEMOGLOBIN A1C  03/26/2016  . OPHTHALMOLOGY EXAM  09/13/2017  . MAMMOGRAM  05/04/2018  . Fecal DNA (Cologuard)  10/13/2018  . TETANUS/TDAP  04/22/2019  . INFLUENZA VACCINE  Completed  . DEXA SCAN  Completed  . Hepatitis C Screening  Completed  . PNA vac Low Risk Adult  Completed    Health Maintenance, Female Adopting a healthy lifestyle and getting preventive care can go a long way  to promote health and wellness. Talk with your health care provider about what schedule of regular examinations is right for you. This is a good chance for you to check in with your provider about disease prevention and staying healthy. In between checkups, there are plenty of things you can do on your own. Experts have done a lot of research about which lifestyle changes and preventive measures are most likely to keep you healthy. Ask your health care provider for more information. Weight and diet Eat a healthy diet  Be sure to include plenty of vegetables, fruits, low-fat dairy products, and lean protein.  Do not eat a lot of foods high in solid fats, added sugars, or salt.  Get regular exercise. This is one of the most important things you can do for your health.  Most adults should exercise for at least 150 minutes each week. The exercise should increase your heart rate and make you sweat (moderate-intensity exercise).  Most adults should also do strengthening exercises at least twice a week. This is in addition to the moderate-intensity exercise. Maintain a healthy weight  Body mass index (BMI) is a measurement that can be used to identify possible weight problems. It estimates body fat based on height and weight. Your health care provider can help determine your BMI and help you achieve or maintain a healthy weight.  For females 87 years of age and older:  A BMI below 18.5 is considered underweight.  A BMI of 18.5 to 24.9  is normal.  A BMI of 25 to 29.9 is considered overweight.  A BMI of 30 and above is considered obese. Watch levels of cholesterol and blood lipids  You should start having your blood tested for lipids and cholesterol at 69 years of age, then have this test every 5 years.  You may need to have your cholesterol levels checked more often if:  Your lipid or cholesterol levels are high.  You are older than 69 years of age.  You are at high risk for heart  disease. Cancer screening Lung Cancer  Lung cancer screening is recommended for adults 68-66 years old who are at high risk for lung cancer because of a history of smoking.  A yearly low-dose CT scan of the lungs is recommended for people who:  Currently smoke.  Have quit within the past 15 years.  Have at least a 30-pack-year history of smoking. A pack year is smoking an average of one pack of cigarettes a day for 1 year.  Yearly screening should continue until it has been 15 years since you quit.  Yearly screening should stop if you develop a health problem that would prevent you from having lung cancer treatment. Breast Cancer  Practice breast self-awareness. This means understanding how your breasts normally appear and feel.  It also means doing regular breast self-exams. Let your health care provider know about any changes, no matter how small.  If you are in your 20s or 30s, you should have a clinical breast exam (CBE) by a health care provider every 1-3 years as part of a regular health exam.  If you are 44 or older, have a CBE every year. Also consider having a breast X-ray (mammogram) every year.  If you have a family history of breast cancer, talk to your health care provider about genetic screening.  If you are at high risk for breast cancer, talk to your health care provider about having an MRI and a mammogram every year.  Breast cancer gene (BRCA) assessment is recommended for women who have family members with BRCA-related cancers. BRCA-related cancers include:  Breast.  Ovarian.  Tubal.  Peritoneal cancers.  Results of the assessment will determine the need for genetic counseling and BRCA1 and BRCA2 testing. Cervical Cancer  Your health care provider may recommend that you be screened regularly for cancer of the pelvic organs (ovaries, uterus, and vagina). This screening involves a pelvic examination, including checking for microscopic changes to the surface  of your cervix (Pap test). You may be encouraged to have this screening done every 3 years, beginning at age 48.  For women ages 89-65, health care providers may recommend pelvic exams and Pap testing every 3 years, or they may recommend the Pap and pelvic exam, combined with testing for human papilloma virus (HPV), every 5 years. Some types of HPV increase your risk of cervical cancer. Testing for HPV may also be done on women of any age with unclear Pap test results.  Other health care providers may not recommend any screening for nonpregnant women who are considered low risk for pelvic cancer and who do not have symptoms. Ask your health care provider if a screening pelvic exam is right for you.  If you have had past treatment for cervical cancer or a condition that could lead to cancer, you need Pap tests and screening for cancer for at least 20 years after your treatment. If Pap tests have been discontinued, your risk factors (such as having a  new sexual partner) need to be reassessed to determine if screening should resume. Some women have medical problems that increase the chance of getting cervical cancer. In these cases, your health care provider may recommend more frequent screening and Pap tests. Colorectal Cancer  This type of cancer can be detected and often prevented.  Routine colorectal cancer screening usually begins at 69 years of age and continues through 69 years of age.  Your health care provider may recommend screening at an earlier age if you have risk factors for colon cancer.  Your health care provider may also recommend using home test kits to check for hidden blood in the stool.  A small camera at the end of a tube can be used to examine your colon directly (sigmoidoscopy or colonoscopy). This is done to check for the earliest forms of colorectal cancer.  Routine screening usually begins at age 74.  Direct examination of the colon should be repeated every 5-10 years  through 69 years of age. However, you may need to be screened more often if early forms of precancerous polyps or small growths are found. Skin Cancer  Check your skin from head to toe regularly.  Tell your health care provider about any new moles or changes in moles, especially if there is a change in a mole's shape or color.  Also tell your health care provider if you have a mole that is larger than the size of a pencil eraser.  Always use sunscreen. Apply sunscreen liberally and repeatedly throughout the day.  Protect yourself by wearing long sleeves, pants, a wide-brimmed hat, and sunglasses whenever you are outside. Heart disease, diabetes, and high blood pressure  High blood pressure causes heart disease and increases the risk of stroke. High blood pressure is more likely to develop in:  People who have blood pressure in the high end of the normal range (130-139/85-89 mm Hg).  People who are overweight or obese.  People who are African American.  If you are 61-70 years of age, have your blood pressure checked every 3-5 years. If you are 93 years of age or older, have your blood pressure checked every year. You should have your blood pressure measured twice-once when you are at a hospital or clinic, and once when you are not at a hospital or clinic. Record the average of the two measurements. To check your blood pressure when you are not at a hospital or clinic, you can use:  An automated blood pressure machine at a pharmacy.  A home blood pressure monitor.  If you are between 79 years and 68 years old, ask your health care provider if you should take aspirin to prevent strokes.  Have regular diabetes screenings. This involves taking a blood sample to check your fasting blood sugar level.  If you are at a normal weight and have a low risk for diabetes, have this test once every three years after 69 years of age.  If you are overweight and have a high risk for diabetes, consider  being tested at a younger age or more often. Preventing infection Hepatitis B  If you have a higher risk for hepatitis B, you should be screened for this virus. You are considered at high risk for hepatitis B if:  You were born in a country where hepatitis B is common. Ask your health care provider which countries are considered high risk.  Your parents were born in a high-risk country, and you have not been immunized against hepatitis  B (hepatitis B vaccine).  You have HIV or AIDS.  You use needles to inject street drugs.  You live with someone who has hepatitis B.  You have had sex with someone who has hepatitis B.  You get hemodialysis treatment.  You take certain medicines for conditions, including cancer, organ transplantation, and autoimmune conditions. Hepatitis C  Blood testing is recommended for:  Everyone born from [birth year] through 1963.  Anyone with known risk factors for hepatitis C. Sexually transmitted infections (STIs)  You should be screened for sexually transmitted infections (STIs) including gonorrhea and chlamydia if:  You are sexually active and are younger than 69 years of age.  You are older than 69 years of age and your health care provider tells you that you are at risk for this type of infection.  Your sexual activity has changed since you were last screened and you are at an increased risk for chlamydia or gonorrhea. Ask your health care provider if you are at risk.  If you do not have HIV, but are at risk, it may be recommended that you take a prescription medicine daily to prevent HIV infection. This is called pre-exposure prophylaxis (PrEP). You are considered at risk if:  You are sexually active and do not regularly use condoms or know the HIV status of your partner(s).  You take drugs by injection.  You are sexually active with a partner who has HIV. Talk with your health care provider about whether you are at high risk of being infected with  HIV. If you choose to begin PrEP, you should first be tested for HIV. You should then be tested every 3 months for as long as you are taking PrEP. Pregnancy  If you are premenopausal and you may become pregnant, ask your health care provider about preconception counseling.  If you may become pregnant, take 400 to 800 micrograms (mcg) of folic acid every day.  If you want to prevent pregnancy, talk to your health care provider about birth control (contraception). Osteoporosis and menopause  Osteoporosis is a disease in which the bones lose minerals and strength with aging. This can result in serious bone fractures. Your risk for osteoporosis can be identified using a bone density scan.  If you are 67 years of age or older, or if you are at risk for osteoporosis and fractures, ask your health care provider if you should be screened.  Ask your health care provider whether you should take a calcium or vitamin D supplement to lower your risk for osteoporosis.  Menopause may have certain physical symptoms and risks.  Hormone replacement therapy may reduce some of these symptoms and risks. Talk to your health care provider about whether hormone replacement therapy is right for you. Follow these instructions at home:  Schedule regular health, dental, and eye exams.  Stay current with your immunizations.  Do not use any tobacco products including cigarettes, chewing tobacco, or electronic cigarettes.  If you are pregnant, do not drink alcohol.  If you are breastfeeding, limit how much and how often you drink alcohol.  Limit alcohol intake to no more than 1 drink per day for nonpregnant women. One drink equals 12 ounces of beer, 5 ounces of wine, or 1 ounces of hard liquor.  Do not use street drugs.  Do not share needles.  Ask your health care provider for help if you need support or information about quitting drugs.  Tell your health care provider if you often feel depressed.  Tell  your health care provider if you have ever been abused or do not feel safe at home. This information is not intended to replace advice given to you by your health care provider. Make sure you discuss any questions you have with your health care provider. Document Released: 01/29/2011 Document Revised: 12/22/2015 Document Reviewed: 04/19/2015 Elsevier Interactive Patient Education  2017 [place] Directive Advance directives are legal documents that let you make choices ahead of time about your health care and medical treatment in case you become unable to communicate for yourself. Advance directives are a way for you to communicate your wishes to family, friends, and health care providers. This can help convey your decisions about end-of-life care if you become unable to communicate. Discussing and writing advance directives should happen over time rather than all at once. Advance directives can be changed depending on your situation and what you want, even after you have signed the advance directives. If you do not have an advance directive, some states assign family decision makers to act on your behalf based on how closely you are related to them. Each state has its own laws regarding advance directives. You may want to check with your health care provider, attorney, or state representative about the laws in your state. There are different types of advance directives, such as:  Medical power of attorney.  Living will.  Do not resuscitate (DNR) or do not attempt resuscitation (DNAR) order. Health care proxy and medical power of attorney A health care proxy, also called a health care agent, is a person who is appointed to make medical decisions for you in cases in which you are unable to make the decisions yourself. Generally, people choose someone they know well and trust to represent their preferences. Make sure to ask this person for an agreement to act as your proxy. A proxy may  have to exercise judgment in the event of a medical decision for which your wishes are not known. A medical power of attorney is a legal document that names your health care proxy. Depending on the laws in your state, after the document is written, it may also need to be:  Signed.  Notarized.  Dated.  Copied.  Witnessed.  Incorporated into your medical record. You may also want to appoint someone to manage your financial affairs in a situation in which you are unable to do so. This is called a durable power of attorney for finances. It is a separate legal document from the durable power of attorney for health care. You may choose the same person or someone different from your health care proxy to act as your agent in financial matters. If you do not appoint a proxy, or if there is a concern that the proxy is not acting in your best interests, a court-appointed guardian may be designated to act on your behalf. Living will A living will is a set of instructions documenting your wishes about medical care when you cannot express them yourself. Health care providers should keep a copy of your living will in your medical record. You may want to give a copy to family members or friends. To alert caregivers in case of an emergency, you can place a card in your wallet to let them know that you have a living will and where they can find it. A living will is used if you become:  Terminally ill.  Incapacitated.  Unable to communicate or make decisions. Items to consider in your  living will include:  The use or non-use of life-sustaining equipment, such as dialysis machines and breathing machines (ventilators).  A DNR or DNAR order, which is the instruction not to use cardiopulmonary resuscitation (CPR) if breathing or heartbeat stops.  The use or non-use of tube feeding.  Withholding of food and fluids.  Comfort (palliative) care when the goal becomes comfort rather than a cure.  Organ and  tissue donation. A living will does not give instructions for distributing your money and property if you should pass away. It is recommended that you seek the advice of a lawyer when writing a will. Decisions about taxes, beneficiaries, and asset distribution will be legally binding. This process can relieve your family and friends of any concerns surrounding disputes or questions that may come up about the distribution of your assets. DNR or DNAR A DNR or DNAR order is a request not to have CPR in the event that your heart stops beating or you stop breathing. If a DNR or DNAR order has not been made and shared, a health care provider will try to help any patient whose heart has stopped or who has stopped breathing. If you plan to have surgery, talk with your health care provider about how your DNR or DNAR order will be followed if problems occur. Summary  Advance directives are the legal documents that allow you to make choices ahead of time about your health care and medical treatment in case you become unable to communicate for yourself.  The process of discussing and writing advance directives should happen over time. You can change the advance directives, even after you have signed them.  Advance directives include DNR or DNAR orders, living wills, and designating an agent as your medical power of attorney. This information is not intended to replace advice given to you by your health care provider. Make sure you discuss any questions you have with your health care provider. Document Released: 10/23/2007 Document Revised: 06/04/2016 Document Reviewed: 06/04/2016 Elsevier Interactive Patient Education  2017 [employer].

## 2016-09-27 NOTE — Assessment & Plan Note (Signed)
1) Anticipatory Guidance: Discussed importance of wearing a seatbelt while driving and not texting while driving; changing batteries in smoke detector at least once annually; wearing suntan lotion when outside; eating a balanced and moderate diet; getting physical activity at least 30 minutes per day.  2) Immunizations / Screenings / Labs:  All immunizations are up-to-date per recommendations. Obtain hemoglobin A1c for diabetes screening /current status. Due for dental exam encouraged to be completed independently. Obtain vitamin D for vitamin D deficiency screening. All other screenings are up-to-date per recommendations. Obtain CBC, CMET, and lipid profile.    Overall well exam with risk factors for cardiovascular disease including hyperlipidemia, hypertension, and type 2 diabetes. Chronic conditions remain labile with current medication regimens. Encouraged weight loss of 5-10% of current body weight through nutrition and physical activity as well as following a low-sodium diet. Continue healthy lifestyle behaviors and choices. Follow-up prevention exam in 1 year. Follow-up office visit for chronic conditions pending blood work.

## 2016-09-27 NOTE — Assessment & Plan Note (Signed)
Reviewed and updated patient's medical, surgical, family and social history. Medications and allergies were also reviewed. Basic screenings for depression, activities of daily living, hearing, cognition and safety were performed. Provider list was updated and health plan was provided to the patient.  

## 2016-09-27 NOTE — Progress Notes (Signed)
Subjective:    Patient ID: Courtney Russell, female    DOB: [birth year], 71 y.o.   MRN: [id number]  Chief Complaint  Patient presents with  . CPE    fasting, refills of all medication    HPI:  Courtney Russell is a 71 y.o. female who presents today for a Medicare Annual Wellness/Physical exam.    1) Health Maintenance -   Diet - Averages about 3 meals per day consisting of a regular diet; Caffeine intake of about 1-2 cups every once in a while  Exercise - No structured exercise.   2) Preventative Exams / Immunizations:  Dental -- Due for an exam   Vision -- Up to date   Health Maintenance  Topic Date Due  . FOOT EXAM  09/21/2014  . HEMOGLOBIN A1C  03/26/2016  . OPHTHALMOLOGY EXAM  09/13/2017  . MAMMOGRAM  05/04/2018  . Fecal DNA (Cologuard)  10/13/2018  . TETANUS/TDAP  04/22/2019  . INFLUENZA VACCINE  Completed  . DEXA SCAN  Completed  . Hepatitis C Screening  Completed  . PNA vac Low Risk Adult  Completed     Immunization History  Administered Date(s) Administered  . Influenza-Unspecified 04/21/2013, 04/29/2014  . Pneumococcal Polysaccharide-23 09/18/2012  . Td 04/21/2009    RISK FACTORS  Tobacco History  Smoking Status  . Never Smoker  Smokeless Tobacco  . Never Used     Cardiac risk factors: advanced age (older than 70 for men, 68 for women), diabetes mellitus, dyslipidemia and hypertension.  Depression Screen  Depression screen [facility] 2/9 09/27/2016  Decreased Interest 0  Down, Depressed, Hopeless 0  PHQ - 2 Score 0  Altered sleeping -  Tired, decreased energy -  Change in appetite -  Feeling bad or failure about yourself  -  Trouble concentrating -  Moving slowly or fidgety/restless -  Suicidal thoughts -  PHQ-9 Score -     Activities of Daily Living In your present state of health, do you have any difficulty performing the following activities?:  Driving? No Managing money?  No Feeding yourself? No Getting from bed to chair?  No Climbing a flight of stairs? No Preparing food and eating?: No Bathing or showering? No Getting dressed: No Getting to the toilet? No Using the toilet: No Moving around from place to place: No In the past year have you fallen or had a near fall?:No   Home Safety Has smoke detector and wears seat belts. No excess sun exposure. Are there smokers in your home (other than you)?  No Do you feel safe at home?  Yes  Hearing Difficulties: No Do you often ask people to speak up or repeat themselves? No Do you experience ringing or noises in your ears? No  Do you have difficulty understanding soft or whispered voices? No    Cognitive Testing  Alert? Yes   Normal Appearance? Yes  Oriented to person? Yes  Place? Yes   Time? Yes  Recall of three objects?  Yes  Can perform simple calculations? Yes  Displays appropriate judgment? Yes  Can read the correct time from a watch face? Yes  Do you feel that you have a problem with memory? No  Do you often misplace items? No   Advanced Directives have been discussed with the patient? Yes; Has a living will   Current Physicians/Providers and Suppliers  1. Terri Piedra, FNP - Internal Medicine 2. Maryland Pink, MD - Gynecology   Indicate any recent Medical Services you may  have received from other than [facility] providers in the past year (date may be approximate).  All answers were reviewed with the patient and necessary referrals were made:  Mauricio Po, FNP   09/27/2016    Allergies  Allergen Reactions  . Fish Oil     REACTION: rash  . Codeine     Mental status changes  . Crestor [Rosuvastatin]     Elevated LFTs  . Vytorin [Ezetimibe-Simvastatin]     Elevated LFTs  . Hydrochlorothiazide     REACTION: low potassium level     Outpatient Medications Prior to Visit  Medication Sig Dispense Refill  . aspirin 81 MG tablet Take 81 mg by mouth 1 dose over 46 hours.      Marland Kitchen glucose blood test strip 1 each by Other route as  needed. Use as instructed     . Lancets (FREESTYLE) lancets 1 each by Other route as needed. Use as instructed     . amLODipine (NORVASC) 5 MG tablet Take 1 tablet (5 mg total) by mouth daily. 30 tablet 0  . cloNIDine (CATAPRES) 0.1 MG tablet Take 1 tablet (0.1 mg total) by mouth 2 (two) times daily. 60 tablet 0  . esomeprazole (NEXIUM) 40 MG capsule Take 1 capsule (40 mg total) by mouth daily. 30 capsule 0  . ezetimibe (ZETIA) 10 MG tablet Take 1 tablet (10 mg total) by mouth daily. 30 tablet 0  . metoprolol (LOPRESSOR) 100 MG tablet Take 0.5 tablets (50 mg total) by mouth 2 (two) times daily. 30 tablet 0  . sulfamethoxazole-trimethoprim (BACTRIM DS,SEPTRA DS) 800-160 MG tablet Take 1 tablet by mouth 2 (two) times daily. 6 tablet 0   No facility-administered medications prior to visit.      Past Medical History:  Diagnosis Date  . GERD (gastroesophageal reflux disease)   . High cholesterol   . Hypertension   . IBS (irritable bowel syndrome)   . Palpitations   . Prediabetes      Past Surgical History:  Procedure Laterality Date  . ABDOMINAL HYSTERECTOMY  1992   fibroids  . BILATERAL SALPINGOOPHORECTOMY  1996   cysts  . BREAST SURGERY     duct excision  . CHOLECYSTECTOMY    . COLONOSCOPY  1998, 2008   negative;Dr Olevia Perches  . HERNIA REPAIR    . TONSILLECTOMY    . TUBAL LIGATION       Family History  Problem Relation Age of Onset  . Cancer Father     COLON & PROSTATE  . Diabetes Father   . Alzheimer's disease Father   . Heart attack Father     MI in late 60s; CABG @ 31  . Stroke Father     in 65s  . Dementia Mother   . Transient ischemic attack Mother     TIAs; AVM leaked in 87s  . Colon polyps Mother   . Coronary artery disease Sister     stent @ 22  . Breast cancer Sister   . Diabetes Paternal Aunt      Social History   Social History  . Marital status: Married    Spouse name: N/A  . Number of children: 3  . Years of education: 30   Occupational  History  . Retired    Social History Main Topics  . Smoking status: Never Smoker  . Smokeless tobacco: Never Used  . Alcohol use No  . Drug use: No  . Sexual activity: Not on file   Other Topics  Concern  . Not on file   Social History Narrative   Fun: Travel and read   Denies abuse and feels safe at home.     Review of Systems  Constitutional: Denies fever, chills, fatigue, or significant weight gain/loss. HENT: Head: Denies headache or neck pain Ears: Denies changes in hearing, ringing in ears, earache, drainage Nose: Denies discharge, stuffiness, itching, nosebleed, sinus pain Throat: Denies sore throat, hoarseness, dry mouth, sores, thrush Eyes: Denies loss/changes in vision, pain, redness, blurry/double vision, flashing lights Cardiovascular: Denies chest pain/discomfort, tightness, palpitations, shortness of breath with activity, difficulty lying down, swelling, sudden awakening with shortness of breath Respiratory: Denies shortness of breath, cough, sputum production, wheezing Gastrointestinal: Denies dysphasia, heartburn, change in appetite, nausea, change in bowel habits, rectal bleeding, constipation, diarrhea, yellow skin or eyes Genitourinary: Denies frequency, urgency, burning/pain, blood in urine, incontinence, change in urinary strength. Musculoskeletal: Denies muscle/joint pain, stiffness, back pain, redness or swelling of joints, trauma Skin: Denies rashes, lumps, itching, dryness, color changes, or hair/nail changes Neurological: Denies dizziness, fainting, seizures, weakness, numbness, tingling, tremor Psychiatric - Denies nervousness, stress, depression or memory loss Endocrine: Denies heat or cold intolerance, sweating, frequent urination, excessive thirst, changes in appetite Hematologic: Denies ease of bruising or bleeding    Objective:     BP (!) 168/98 (BP Location: Left Arm, Patient Position: Sitting, Cuff Size: Normal)   Pulse (!) 51   Temp 98.6 F  (37 C) (Oral)   Resp 14   Ht 5\' 4"  (1.626 m)   Wt 169 lb (76.7 kg)   SpO2 97%   BMI 29.01 kg/m  Nursing note and vital signs reviewed.  Physical Exam  Constitutional: She is oriented to person, place, and time. She appears well-developed and well-nourished.  HENT:  Head: Normocephalic.  Right Ear: Hearing, tympanic membrane, external ear and ear canal normal.  Left Ear: Hearing, tympanic membrane, external ear and ear canal normal.  Nose: Nose normal.  Mouth/Throat: Uvula is midline, oropharynx is clear and moist and mucous membranes are normal.  Eyes: Conjunctivae and EOM are normal. Pupils are equal, round, and reactive to light.  Neck: Neck supple. No JVD present. No tracheal deviation present. No thyromegaly present.  Cardiovascular: Normal rate, regular rhythm, normal heart sounds and intact distal pulses.   Pulmonary/Chest: Effort normal and breath sounds normal.  Abdominal: Soft. Bowel sounds are normal. She exhibits no distension and no mass. There is no tenderness. There is no rebound and no guarding.  Musculoskeletal: Normal range of motion. She exhibits no edema or tenderness.  Lymphadenopathy:    She has no cervical adenopathy.  Neurological: She is alert and oriented to person, place, and time. She has normal reflexes. No cranial nerve deficit. She exhibits normal muscle tone. Coordination normal.  Diabetic Foot Exam - Simple   Simple Foot Form Visual Inspection No deformities, no ulcerations, no other skin breakdown bilaterally:  Yes Sensation Testing Intact to touch and monofilament testing bilaterally:  Yes Pulse Check Posterior Tibialis and Dorsalis pulse intact bilaterally:  Yes Comments  Skin: Skin is warm and dry.  Psychiatric: She has a normal mood and affect. Her behavior is normal. Judgment and thought content normal.       Assessment & Plan:   During the course of the visit the patient was educated and counseled about appropriate screening and  preventive services including:    Pneumococcal vaccine   Influenza vaccine  Td vaccine  Colorectal cancer screening  Diabetes screening  Glaucoma screening  Nutrition counseling   Diet review for nutrition referral? Yes ____  Not Indicated _X___   Patient Instructions (the written plan) was given to the patient.  Medicare Attestation I have personally reviewed: The patient's medical and social history Their use of alcohol, tobacco or illicit drugs Their current medications and supplements The patient's functional ability including ADLs,fall risks, home safety risks, cognitive, and hearing and visual impairment Diet and physical activities Evidence for depression or mood disorders  The patient's weight, height, BMI,  have been recorded in the chart.  I have made referrals, counseling, and provided education to the patient based on review of the above and I have provided the patient with a written personalized care plan for preventive services.     Problem List Items Addressed This Visit      Cardiovascular and Mediastinum   Essential hypertension    Blood pressure remains uncontrolled and above goal 140/90 with current medication regimen and no adverse side effects. Home blood pressures appear to be with improved control compared to an office reading. Denies worst headache of life with no new symptoms of end organ damage noted on physical exam. Encouraged follow sodium diet to monitor blood pressure at home.      Relevant Medications   losartan (COZAAR) 50 MG tablet   metoprolol (LOPRESSOR) 100 MG tablet   ezetimibe (ZETIA) 10 MG tablet   cloNIDine (CATAPRES) 0.1 MG tablet     Endocrine   Type 2 diabetes mellitus (HCC)    Obtain hemoglobin A1c and urine microalbumin. Diabetic foot exam completed today. Pneumovax is up-to-date. Maintained on losartan and for CAD risk reduction. Unable to take statins secondary to intolerance. Diabetic eye exam is up-to-date. Not  currently checking blood sugars at home. Continue lifestyle management pending A1c results. Continue to monitor.      Relevant Medications   losartan (COZAAR) 50 MG tablet   Other Relevant Orders   Hemoglobin A1c (Completed)   Urine Microalbumin w/creat. ratio (Completed)     Other   Routine general medical examination at a health care facility    1) Anticipatory Guidance: Discussed importance of wearing a seatbelt while driving and not texting while driving; changing batteries in smoke detector at least once annually; wearing suntan lotion when outside; eating a balanced and moderate diet; getting physical activity at least 30 minutes per day.  2) Immunizations / Screenings / Labs:  All immunizations are up-to-date per recommendations. Obtain hemoglobin A1c for diabetes screening /current status. Due for dental exam encouraged to be completed independently. Obtain vitamin D for vitamin D deficiency screening. All other screenings are up-to-date per recommendations. Obtain CBC, CMET, and lipid profile.    Overall well exam with risk factors for cardiovascular disease including hyperlipidemia, hypertension, and type 2 diabetes. Chronic conditions remain labile with current medication regimens. Encouraged weight loss of 5-10% of current body weight through nutrition and physical activity as well as following a low-sodium diet. Continue healthy lifestyle behaviors and choices. Follow-up prevention exam in 1 year. Follow-up office visit for chronic conditions pending blood work.      Relevant Orders   CBC (Completed)   Comprehensive metabolic panel (Completed)   Lipid panel (Completed)   VITAMIN D 25 Hydroxy (Vit-D Deficiency, Fractures) (Completed)   Medicare annual wellness visit, subsequent - Primary    Reviewed and updated patient's medical, surgical, family and social history. Medications and allergies were also reviewed. Basic screenings for depression, activities of daily living,  hearing, cognition and safety were performed.  Provider list was updated and health plan was provided to the patient.           I have discontinued Ms. Pickford's sulfamethoxazole-trimethoprim and amLODipine. I am also having her start on losartan. Additionally, I am having her maintain her aspirin, freestyle, glucose blood, metoprolol, ezetimibe, esomeprazole, and cloNIDine.   Meds ordered this encounter  Medications  . losartan (COZAAR) 50 MG tablet    Sig: Take 1 tablet (50 mg total) by mouth daily.    Dispense:  30 tablet    Refill:  0    Order Specific Question:   Supervising Provider    Answer:   Pricilla Holm A [id number]  . metoprolol (LOPRESSOR) 100 MG tablet    Sig: Take 0.5 tablets (50 mg total) by mouth 2 (two) times daily.    Dispense:  90 tablet    Refill:  0    Order Specific Question:   Supervising Provider    Answer:   Pricilla Holm A [id number]  . ezetimibe (ZETIA) 10 MG tablet    Sig: Take 1 tablet (10 mg total) by mouth daily.    Dispense:  90 tablet    Refill:  0    Order Specific Question:   Supervising Provider    Answer:   Pricilla Holm A [id number]  . esomeprazole (NEXIUM) 40 MG capsule    Sig: Take 1 capsule (40 mg total) by mouth daily.    Dispense:  90 capsule    Refill:  0    Order Specific Question:   Supervising Provider    Answer:   Pricilla Holm A [id number]  . cloNIDine (CATAPRES) 0.1 MG tablet    Sig: Take 1 tablet (0.1 mg total) by mouth 2 (two) times daily.    Dispense:  180 tablet    Refill:  0    Order Specific Question:   Supervising Provider    Answer:   Pricilla Holm A [id number]     Follow-up: Return in about 1 month (around 10/28/2016), or if symptoms worsen or fail to improve.   Mauricio Po, FNP

## 2016-09-27 NOTE — Assessment & Plan Note (Signed)
Currently maintained on exam the a with known statin intolerance. Encouraged decreasing cholesterol intake and nutrition as well as saturated fats. Continue current dosage of Zetia pending lipid profile results.

## 2016-09-27 NOTE — Assessment & Plan Note (Signed)
Blood pressure remains uncontrolled and above goal 140/90 with current medication regimen and no adverse side effects. Home blood pressures appear to be with improved control compared to an office reading. Denies worst headache of life with no new symptoms of end organ damage noted on physical exam. Encouraged follow sodium diet to monitor blood pressure at home.

## 2016-10-01 ENCOUNTER — Encounter: Payer: Self-pay | Admitting: Family

## 2016-10-01 DIAGNOSIS — E559 Vitamin D deficiency, unspecified: Secondary | ICD-10-CM | POA: Insufficient documentation

## 2016-10-01 MED ORDER — VITAMIN D3 1.25 MG (50000 UT) PO TABS: 50000.0000 [IU] | ORAL_TABLET | ORAL | 0 refills | Status: DC

## 2016-10-10 ENCOUNTER — Telehealth: Payer: Self-pay | Admitting: Family

## 2016-10-10 DIAGNOSIS — I10 Essential (primary) hypertension: Secondary | ICD-10-CM

## 2016-10-10 MED ORDER — LOSARTAN POTASSIUM 50 MG PO TABS: 50.0000 mg | ORAL_TABLET | Freq: Every day | ORAL | 0 refills | Status: DC

## 2016-10-10 NOTE — Telephone Encounter (Signed)
Patient called in to give an update on her bp medication. She states she is doing great on it. No side effects on it so far. Patient states if you are going to keep her on it she will need a refill and she would like it to be 90 days. Thank you. Follow up with patient if needed.

## 2016-10-10 NOTE — Telephone Encounter (Signed)
90 day supply sent to pharmacy

## 2016-12-28 ENCOUNTER — Encounter: Payer: Self-pay | Admitting: Family

## 2016-12-28 ENCOUNTER — Ambulatory Visit (INDEPENDENT_AMBULATORY_CARE_PROVIDER_SITE_OTHER): Payer: Medicare Other | Admitting: Family

## 2016-12-28 VITALS — BP 124/78 | HR 48 | Temp 98.5°F | Resp 14 | Ht 64.0 in | Wt 168.0 lb

## 2016-12-28 DIAGNOSIS — I10 Essential (primary) hypertension: Secondary | ICD-10-CM | POA: Diagnosis not present

## 2016-12-28 DIAGNOSIS — E119 Type 2 diabetes mellitus without complications: Secondary | ICD-10-CM | POA: Diagnosis not present

## 2016-12-28 LAB — POCT GLYCOSYLATED HEMOGLOBIN (HGB A1C): HEMOGLOBIN A1C: 6.4

## 2016-12-28 MED ORDER — CLONIDINE HCL 0.1 MG PO TABS: 0.1000 mg | ORAL_TABLET | Freq: Two times a day (BID) | ORAL | 3 refills | Status: DC

## 2016-12-28 MED ORDER — LOSARTAN POTASSIUM 50 MG PO TABS: 50.0000 mg | ORAL_TABLET | Freq: Every day | ORAL | 3 refills | Status: DC

## 2016-12-28 MED ORDER — METOPROLOL TARTRATE 50 MG PO TABS: 50.0000 mg | ORAL_TABLET | Freq: Two times a day (BID) | ORAL | 0 refills | Status: DC

## 2016-12-28 MED ORDER — EZETIMIBE 10 MG PO TABS: 10.0000 mg | ORAL_TABLET | Freq: Every day | ORAL | 3 refills | Status: DC

## 2016-12-28 NOTE — Assessment & Plan Note (Signed)
Blood pressure adequately controlled and below goal 140/90 with current medication regimen and no adverse side effects or hypotensive readings. Denies worst headache of life with no symptoms of end organ damage noted on physical exam. Does have mild fatigue with metoprolol. Recommend trialing 25 mg twice daily. Continue current dosage of clonidine and losartan. Encouraged to monitor blood pressure at home and follow low-sodium diet.

## 2016-12-28 NOTE — Patient Instructions (Signed)
Thank you for choosing [facility].  SUMMARY AND INSTRUCTIONS:  Please continue to take your medication as prescribed.  STOP taking the Nexium and use Zantac and Pepcid as needed.  Continue to monitor your blood pressure at home and follow a low sodium sodium diet.  Check on the [place] diet below.   Medication:  Your prescription(s) have been submitted to your pharmacy or been printed and provided for you. Please take as directed and contact our office if you believe you are having problem(s) with the medication(s) or have any questions.  Follow up:  If your symptoms worsen or fail to improve, please contact our office for further instruction, or in case of emergency go directly to the emergency room at the closest medical facility.       Why follow it? Research shows. . Those who follow the Mediterranean diet have a reduced risk of heart disease  . The diet is associated with a reduced incidence of Parkinson's and Alzheimer's diseases . People following the diet may have longer life expectancies and lower rates of chronic diseases  . The Dietary Guidelines for Americans recommends the Mediterranean diet as an eating plan to promote health and prevent disease  What Is the Mediterranean Diet?  . Healthy eating plan based on typical foods and recipes of Mediterranean-style cooking . The diet is primarily a plant based diet; these foods should make up a majority of meals   Starches - Plant based foods should make up a majority of meals - They are an important sources of vitamins, minerals, energy, antioxidants, and fiber - Choose whole grains, foods high in fiber and minimally processed items  - Typical grain sources include wheat, oats, barley, corn, brown rice, bulgar, farro, millet, polenta, couscous  - Various types of beans include chickpeas, lentils, fava beans, black beans, white beans   Fruits  Veggies - Large quantities of antioxidant rich fruits & veggies; 6  or more servings  - Vegetables can be eaten raw or lightly drizzled with oil and cooked  - Vegetables common to the traditional Mediterranean Diet include: artichokes, arugula, beets, broccoli, brussel sprouts, cabbage, carrots, celery, collard greens, cucumbers, eggplant, kale, leeks, lemons, lettuce, mushrooms, okra, onions, peas, peppers, potatoes, pumpkin, radishes, rutabaga, shallots, spinach, sweet potatoes, turnips, zucchini - Fruits common to the Mediterranean Diet include: apples, apricots, avocados, cherries, clementines, dates, figs, grapefruits, grapes, melons, nectarines, oranges, peaches, pears, pomegranates, strawberries, tangerines  Fats - Replace butter and margarine with healthy oils, such as olive oil, canola oil, and tahini  - Limit nuts to no more than a handful a day  - Nuts include walnuts, almonds, pecans, pistachios, pine nuts  - Limit or avoid candied, honey roasted or heavily salted nuts - Olives are central to the [employer] - can be eaten whole or used in a variety of dishes   Meats Protein - Limiting red meat: no more than a few times a month - When eating red meat: choose lean cuts and keep the portion to the size of deck of cards - Eggs: approx. 0 to 4 times a week  - Fish and lean poultry: at least 2 a week  - Healthy protein sources include, chicken, [place], lean beef, lamb - Increase intake of seafood such as tuna, salmon, trout, mackerel, shrimp, scallops - Avoid or limit high fat processed meats such as sausage and bacon  Dairy - Include moderate amounts of low fat dairy products  - Focus on healthy dairy such as fat  free yogurt, skim milk, low or reduced fat cheese - Limit dairy products higher in fat such as whole or 2% milk, cheese, ice cream  Alcohol - Moderate amounts of red wine is ok  - No more than 5 oz daily for women (all ages) and men older than age 70  - No more than 10 oz of wine daily for men younger than 7  Other - Limit sweets and  other desserts  - Use herbs and spices instead of salt to flavor foods  - Herbs and spices common to the traditional Mediterranean Diet include: basil, bay leaves, chives, cloves, cumin, fennel, garlic, lavender, marjoram, mint, oregano, parsley, pepper, rosemary, sage, savory, sumac, tarragon, thyme   It's not just a diet, it's a lifestyle:  . The Mediterranean diet includes lifestyle factors typical of those in the region  . Foods, drinks and meals are best eaten with others and savored . Daily physical activity is important for overall good health . This could be strenuous exercise like running and aerobics . This could also be more leisurely activities such as walking, housework, yard-work, or taking the stairs . Moderation is the key; a balanced and healthy diet accommodates most foods and drinks . Consider portion sizes and frequency of consumption of certain foods   Meal Ideas & Options:  . Breakfast:  o Whole wheat toast or whole wheat English muffins with peanut butter & hard boiled egg o Steel cut oats topped with apples & cinnamon and skim milk  o Fresh fruit: banana, strawberries, melon, berries, peaches  o Smoothies: strawberries, bananas, greek yogurt, peanut butter o Low fat greek yogurt with blueberries and granola  o Egg white omelet with spinach and mushrooms o Breakfast couscous: whole wheat couscous, apricots, skim milk, cranberries  . Sandwiches:  o Hummus and grilled vegetables (peppers, zucchini, squash) on whole wheat bread   o Grilled chicken on whole wheat pita with lettuce, tomatoes, cucumbers or tzatziki  o Tuna salad on whole wheat bread: tuna salad made with greek yogurt, olives, red peppers, capers, green onions o Garlic rosemary lamb pita: lamb sauted with garlic, rosemary, salt & pepper; add lettuce, cucumber, greek yogurt to pita - flavor with lemon juice and black pepper  . Seafood:  o Mediterranean grilled salmon, seasoned with garlic, basil, parsley,  lemon juice and black pepper o Shrimp, lemon, and spinach whole-grain pasta salad made with low fat greek yogurt  o Seared scallops with lemon orzo  o Seared tuna steaks seasoned salt, pepper, coriander topped with tomato mixture of olives, tomatoes, olive oil, minced garlic, parsley, green onions and cappers  . Meats:  o Herbed greek chicken salad with kalamata olives, cucumber, feta  o Red bell peppers stuffed with spinach, bulgur, lean ground beef (or lentils) & topped with feta   o Kebabs: skewers of chicken, tomatoes, onions, zucchini, squash  o [place] burgers: made with red onions, mint, dill, lemon juice, feta cheese topped with roasted red peppers . Vegetarian o Cucumber salad: cucumbers, artichoke hearts, celery, red onion, feta cheese, tossed in olive oil & lemon juice  o Hummus and whole grain pita points with a greek salad (lettuce, tomato, feta, olives, cucumbers, red onion) o Lentil soup with celery, carrots made with vegetable broth, garlic, salt and pepper  o Tabouli salad: parsley, bulgur, mint, scallions, cucumbers, tomato, radishes, lemon juice, olive oil, salt and pepper.

## 2016-12-28 NOTE — Progress Notes (Signed)
Subjective:    Patient ID: Courtney Russell, female    DOB: 04/04/1946, 71 y.o.   MRN: [id number]  Chief Complaint  Patient presents with  . Follow-up    needs refill of all meds but nexium, would like a year supply or until due to next physical, A1c check    HPI:  Courtney Russell is a 71 y.o. female who  has a past medical history of GERD (gastroesophageal reflux disease); High cholesterol; Hypertension; IBS (irritable bowel syndrome); Palpitations; and Prediabetes. and presents today for a follow up office visit.   1.) Hypertension -  Currently maintained on clonidine, losartan, and metoprolol. Reports taking the medications as prescribed and denies adverse side effects or hypotensive readings. Blood pressures at home have remained control and below goal of 140/90. Denies changes in vision, worst headache of life or new symptoms of end organ damage. Working on following a low sodium diet.   BP Readings from Last 3 Encounters:  12/28/16 124/78  09/27/16 (!) 168/98  09/27/15 (!) 146/96    2.) Type 2 diabetes - Previous A1c of 6.8 and currently maintained on lifestyle management with no adverse side effects or hypoglycemic symptoms. Does not current check blood sugars at home . Working on improving nutrition through decreasing carbohydrate intake. Also working on increasing physical activity. No symptoms of excessive hunger, thirst or urination. No new symptoms of end organ damage.  Lab Results  Component Value Date   HGBA1C 6.4 12/28/2016     Allergies  Allergen Reactions  . Fish Oil     REACTION: rash  . Codeine     Mental status changes  . Crestor [Rosuvastatin]     Elevated LFTs  . Vytorin [Ezetimibe-Simvastatin]     Elevated LFTs  . Hydrochlorothiazide     REACTION: low potassium level      Outpatient Medications Prior to Visit  Medication Sig Dispense Refill  . aspirin 81 MG tablet Take 81 mg by mouth 1 dose over 46 hours.      . Cholecalciferol (VITAMIN D3)  50000 units TABS Take 50,000 Units by mouth once a week. 12 tablet 0  . glucose blood test strip 1 each by Other route as needed. Use as instructed     . Lancets (FREESTYLE) lancets 1 each by Other route as needed. Use as instructed     . cloNIDine (CATAPRES) 0.1 MG tablet Take 1 tablet (0.1 mg total) by mouth 2 (two) times daily. 180 tablet 0  . esomeprazole (NEXIUM) 40 MG capsule Take 1 capsule (40 mg total) by mouth daily. 90 capsule 0  . ezetimibe (ZETIA) 10 MG tablet Take 1 tablet (10 mg total) by mouth daily. 90 tablet 0  . losartan (COZAAR) 50 MG tablet Take 1 tablet (50 mg total) by mouth daily. 90 tablet 0  . metoprolol (LOPRESSOR) 100 MG tablet Take 0.5 tablets (50 mg total) by mouth 2 (two) times daily. 90 tablet 0   No facility-administered medications prior to visit.       Review of Systems  Constitutional: Negative for chills, fatigue and fever.  Eyes:       Denies changes in vision  Respiratory: Negative for cough, chest tightness, shortness of breath and wheezing.   Cardiovascular: Negative for chest pain, palpitations and leg swelling.  Endocrine: Negative for polydipsia, polyphagia and polyuria.  Neurological: Negative for dizziness, weakness, light-headedness and numbness.      Objective:    BP 124/78 (BP Location: Left Arm, Patient  Position: Sitting, Cuff Size: Large)   Pulse (!) 48   Temp 98.5 F (36.9 C) (Oral)   Resp 14   Ht 5\' 4"  (1.626 m)   Wt 168 lb (76.2 kg)   SpO2 97%   BMI 28.84 kg/m  Nursing note and vital signs reviewed.  Physical Exam  Constitutional: She is oriented to person, place, and time. She appears well-developed and well-nourished. No distress.  Cardiovascular: Normal rate, regular rhythm, normal heart sounds and intact distal pulses.  Exam reveals no gallop and no friction rub.   No murmur heard. Pulmonary/Chest: Breath sounds normal. No respiratory distress. She has no wheezes. She has no rales. She exhibits no tenderness.    Neurological: She is alert and oriented to person, place, and time.  Skin: Skin is warm and dry.  Psychiatric: She has a normal mood and affect. Her behavior is normal. Judgment and thought content normal.       Assessment & Plan:   Problem List Items Addressed This Visit      Cardiovascular and Mediastinum   Essential hypertension    Blood pressure adequately controlled and below goal 140/90 with current medication regimen and no adverse side effects or hypotensive readings. Denies worst headache of life with no symptoms of end organ damage noted on physical exam. Does have mild fatigue with metoprolol. Recommend trialing 25 mg twice daily. Continue current dosage of clonidine and losartan. Encouraged to monitor blood pressure at home and follow low-sodium diet.      Relevant Medications   cloNIDine (CATAPRES) 0.1 MG tablet   ezetimibe (ZETIA) 10 MG tablet   losartan (COZAAR) 50 MG tablet   metoprolol tartrate (LOPRESSOR) 50 MG tablet     Endocrine   Type 2 diabetes mellitus ([place]) - Primary    In office A1c of 6.4. Type 2 diabetes remains adequately controlled with lifestyle management and no indication for medication at this time. Diabetic foot exam completed today. All other diabetic screenings are up-to-date per recommendations. Maintained on losartan and for CAD risk reduction. Does not tolerate statins. Continue with lifestyle management with recommendation for following a low carbohydrate diet. Follow-up in 6 months or sooner if needed.      Relevant Medications   losartan (COZAAR) 50 MG tablet   Other Relevant Orders   POCT HgB A1C (Completed)       I have discontinued Ms. Verry's esomeprazole. I have also changed her metoprolol tartrate. Additionally, I am having her maintain her aspirin, freestyle, glucose blood, Vitamin D3, cloNIDine, ezetimibe, and losartan.   Meds ordered this encounter  Medications  . cloNIDine (CATAPRES) 0.1 MG tablet    Sig: Take 1 tablet  (0.1 mg total) by mouth 2 (two) times daily.    Dispense:  180 tablet    Refill:  3    Order Specific Question:   Supervising Provider    Answer:   Pricilla Holm A [[id number]]  . ezetimibe (ZETIA) 10 MG tablet    Sig: Take 1 tablet (10 mg total) by mouth daily.    Dispense:  90 tablet    Refill:  3    Order Specific Question:   Supervising Provider    Answer:   Pricilla Holm A [[id number]]  . losartan (COZAAR) 50 MG tablet    Sig: Take 1 tablet (50 mg total) by mouth daily.    Dispense:  90 tablet    Refill:  3    Order Specific Question:   Supervising Provider  Answer:   Pricilla Holm A [[id number]]  . metoprolol tartrate (LOPRESSOR) 50 MG tablet    Sig: Take 1 tablet (50 mg total) by mouth 2 (two) times daily.    Dispense:  180 tablet    Refill:  0    Order Specific Question:   Supervising Provider    Answer:   Pricilla Holm A [[id number]]     Follow-up: Return in about 6 months (around 06/29/2017), or if symptoms worsen or fail to improve.  Mauricio Po, FNP

## 2016-12-28 NOTE — Assessment & Plan Note (Signed)
In office A1c of 6.4. Type 2 diabetes remains adequately controlled with lifestyle management and no indication for medication at this time. Diabetic foot exam completed today. All other diabetic screenings are up-to-date per recommendations. Maintained on losartan and for CAD risk reduction. Does not tolerate statins. Continue with lifestyle management with recommendation for following a low carbohydrate diet. Follow-up in 6 months or sooner if needed.

## 2017-04-11 ENCOUNTER — Telehealth: Payer: Self-pay | Admitting: Family

## 2017-04-11 DIAGNOSIS — I10 Essential (primary) hypertension: Secondary | ICD-10-CM

## 2017-04-11 MED ORDER — METOPROLOL TARTRATE 50 MG PO TABS: 50.0000 mg | ORAL_TABLET | Freq: Two times a day (BID) | ORAL | 0 refills | Status: DC

## 2017-04-11 NOTE — Telephone Encounter (Signed)
Refill sent to [facility]...[facility]

## 2017-04-11 NOTE — Telephone Encounter (Signed)
Pt called for a refill of her  metoprolol tartrate (LOPRESSOR) 50 MG tablet  She is due in about a week but wanted to go ahead and call it in.

## 2017-07-03 ENCOUNTER — Other Ambulatory Visit (INDEPENDENT_AMBULATORY_CARE_PROVIDER_SITE_OTHER): Payer: Medicare Other

## 2017-07-03 ENCOUNTER — Ambulatory Visit: Payer: Medicare Other | Admitting: Family

## 2017-07-03 ENCOUNTER — Encounter: Payer: Self-pay | Admitting: Nurse Practitioner

## 2017-07-03 ENCOUNTER — Ambulatory Visit: Payer: Medicare Other | Admitting: Nurse Practitioner

## 2017-07-03 VITALS — BP 148/82 | HR 55 | Temp 98.1°F | Resp 16 | Ht 64.0 in | Wt 165.4 lb

## 2017-07-03 DIAGNOSIS — E559 Vitamin D deficiency, unspecified: Secondary | ICD-10-CM

## 2017-07-03 DIAGNOSIS — E119 Type 2 diabetes mellitus without complications: Secondary | ICD-10-CM

## 2017-07-03 DIAGNOSIS — K219 Gastro-esophageal reflux disease without esophagitis: Secondary | ICD-10-CM | POA: Diagnosis not present

## 2017-07-03 DIAGNOSIS — R0981 Nasal congestion: Secondary | ICD-10-CM

## 2017-07-03 DIAGNOSIS — I10 Essential (primary) hypertension: Secondary | ICD-10-CM | POA: Diagnosis not present

## 2017-07-03 DIAGNOSIS — E782 Mixed hyperlipidemia: Secondary | ICD-10-CM | POA: Diagnosis not present

## 2017-07-03 LAB — COMPREHENSIVE METABOLIC PANEL
ALBUMIN: 4.1 g/dL (ref 3.5–5.2)
ALT: 43 U/L — ABNORMAL HIGH (ref 0–35)
AST: 50 U/L — ABNORMAL HIGH (ref 0–37)
Alkaline Phosphatase: 77 U/L (ref 39–117)
BILIRUBIN TOTAL: 1.4 mg/dL — AB (ref 0.2–1.2)
BUN: 13 mg/dL (ref 6–23)
CALCIUM: 10.4 mg/dL (ref 8.4–10.5)
CO2: 28 mEq/L (ref 19–32)
CREATININE: 0.79 mg/dL (ref 0.40–1.20)
Chloride: 104 mEq/L (ref 96–112)
GFR: 76.5 mL/min (ref >60.00)
Glucose, Bld: 129 mg/dL — ABNORMAL HIGH (ref 70–99)
POTASSIUM: 4.5 meq/L (ref 3.5–5.1)
Sodium: 138 mEq/L (ref 135–145)
Total Protein: 7.2 g/dL (ref 6.0–8.3)

## 2017-07-03 LAB — LIPID PANEL
CHOL/HDL RATIO: 4
Cholesterol: 184 mg/dL (ref 0–200)
HDL: 49.3 mg/dL (ref >39.00)
LDL CALC: 104 mg/dL — AB (ref 0–99)
NonHDL: 134.41
TRIGLYCERIDES: 152 mg/dL — AB (ref 0.0–149.0)
VLDL: 30.4 mg/dL (ref 0.0–40.0)

## 2017-07-03 LAB — HEMOGLOBIN A1C: Hgb A1c MFr Bld: 6.7 % — ABNORMAL HIGH (ref 4.6–6.5)

## 2017-07-03 MED ORDER — ESOMEPRAZOLE MAGNESIUM 40 MG PO CPDR: 40.0000 mg | DELAYED_RELEASE_CAPSULE | Freq: Every day | ORAL | 2 refills | Status: DC

## 2017-07-03 MED ORDER — FLUTICASONE PROPIONATE 50 MCG/ACT NA SUSP: 2.0000 | Freq: Every day | NASAL | 6 refills | Status: DC

## 2017-07-03 MED ORDER — METOPROLOL TARTRATE 50 MG PO TABS: 50.0000 mg | ORAL_TABLET | Freq: Two times a day (BID) | ORAL | 0 refills | Status: DC

## 2017-07-03 NOTE — Assessment & Plan Note (Signed)
Maintained off medications; reports recent lifestyle changes. Diet and exercise discussed. - Comprehensive metabolic panel; Future - Hemoglobin A1c; Future

## 2017-07-03 NOTE — Progress Notes (Signed)
Subjective:    Patient ID: Courtney Russell, female    DOB: 1946/06/15, 71 y.o.   MRN: [id number]  HPI Courtney Russell is a 71 yo female who presents today to establish care. She is transferring to me from another provider in the same clinic.  Hypertension- maintained on clonidine, losartan, metoprolol. She cut her dose of losartan in half because it made her feel lightheaded. She denies any headaches, vision changes, chest pain, shortness of breath. Her BP is elevated today but she says that her blood pressure often goes up at doctors appointments. She checks readings at home about twice per week - 120's-low 140s/ 70s - 80s. She has been unable to tolerate amlodipine in the past, and hesitates to make many changes to her medications.  BP Readings from Last 3 Encounters:  07/03/17 (!) 148/82  12/28/16 124/78  09/27/16 (!) 168/98    Vitamin D deficiency-low reading in march. She was started on 50,000 units for a few months, then the dose decreased to 1,000 IU daily.  Hyperlipidemia-Maintained on zetia. Was unable to tolerate crestor and vytorin-caused LFT elevation in the past. She says she tolerates zetia well. She was referred to lipid clinic earlier this year but never heard about the referral and hates to have to drive to [place] for the lipid clinic anyways, she lives 45 minutes out of town  Lab Results  Component Value Date   CHOL 206 (H) 09/27/2016   HDL 49.80 09/27/2016   LDLCALC 125 (H) 09/27/2016   LDLDIRECT 185.0 09/21/2013   TRIG 156.0 (H) 09/27/2016   CHOLHDL 4 09/27/2016    Diabetes- currently not maintained on any medications. She has been controlling her blood sugar with Diet and exercise in the past but today she tells me  does eat sweets regularly and has not been exercising as much. She does not check her blood sugar at home regularly.  Lab Results  Component Value Date   HGBA1C 6.4 12/28/2016   Ear pain-left-This is an acute problem. The pain began yesterday.  Reports nasal congestion, rhinorrhea. Denies fevers, headache, drainage.   Review of Systems  See HPI  Past Medical History:  Diagnosis Date  . GERD (gastroesophageal reflux disease)   . High cholesterol   . Hypertension   . IBS (irritable bowel syndrome)   . Palpitations   . Prediabetes      Social History   Socioeconomic History  . Marital status: Married    Spouse name: Not on file  . Number of children: 3  . Years of education: 26  . Highest education level: Not on file  Social Needs  . Financial resource strain: Not on file  . Food insecurity - worry: Not on file  . Food insecurity - inability: Not on file  . Transportation needs - medical: Not on file  . Transportation needs - non-medical: Not on file  Occupational History  . Occupation: Retired  Tobacco Use  . Smoking status: Never Smoker  . Smokeless tobacco: Never Used  Substance and Sexual Activity  . Alcohol use: No  . Drug use: No  . Sexual activity: Not on file  Other Topics Concern  . Not on file  Social History Narrative   Fun: Travel and read   Denies abuse and feels safe at home.     Past Surgical History:  Procedure Laterality Date  . ABDOMINAL HYSTERECTOMY  1992   fibroids  . BILATERAL SALPINGOOPHORECTOMY  1996   cysts  . BREAST SURGERY  duct excision  . CHOLECYSTECTOMY    . COLONOSCOPY  1998, 2008   negative;Dr Olevia Perches  . HERNIA REPAIR    . TONSILLECTOMY    . TUBAL LIGATION      Family History  Problem Relation Age of Onset  . Cancer Father        COLON & PROSTATE  . Diabetes Father   . Alzheimer's disease Father   . Heart attack Father        MI in late 46s; CABG @ 61  . Stroke Father        in 55s  . Dementia Mother   . Transient ischemic attack Mother        TIAs; AVM leaked in 48s  . Colon polyps Mother   . Coronary artery disease Sister        stent @ 56  . Breast cancer Sister   . Diabetes Paternal Aunt     Allergies  Allergen Reactions  . Fish Oil      REACTION: rash  . Codeine     Mental status changes  . Crestor [Rosuvastatin]     Elevated LFTs  . Vytorin [Ezetimibe-Simvastatin]     Elevated LFTs  . Hydrochlorothiazide     REACTION: low potassium level    Current Outpatient Medications on File Prior to Visit  Medication Sig Dispense Refill  . aspirin 81 MG tablet Take 81 mg by mouth 1 dose over 46 hours.      . Cholecalciferol (VITAMIN D3) 50000 units TABS Take 50,000 Units by mouth once a week. 12 tablet 0  . cloNIDine (CATAPRES) 0.1 MG tablet Take 1 tablet (0.1 mg total) by mouth 2 (two) times daily. 180 tablet 3  . esomeprazole (NEXIUM) 40 MG capsule Take 40 mg by mouth daily at 12 noon.    . ezetimibe (ZETIA) 10 MG tablet Take 1 tablet (10 mg total) by mouth daily. 90 tablet 3  . glucose blood test strip 1 each by Other route as needed. Use as instructed     . Lancets (FREESTYLE) lancets 1 each by Other route as needed. Use as instructed     . losartan (COZAAR) 50 MG tablet Take 1 tablet (50 mg total) by mouth daily. 90 tablet 3  . metoprolol tartrate (LOPRESSOR) 50 MG tablet Take 1 tablet (50 mg total) by mouth 2 (two) times daily. 180 tablet 0   No current facility-administered medications on file prior to visit.     BP (!) 148/82 (BP Location: Left Arm, Patient Position: Sitting, Cuff Size: Large)   Pulse (!) 55   Temp 98.1 F (36.7 C) (Oral)   Resp 16   Ht 5\' 4"  (1.626 m)   Wt 165 lb 6.4 oz (75 kg)   SpO2 98%   BMI 28.39 kg/m       Objective:   Physical Exam  Constitutional: She is oriented to person, place, and time. She appears well-developed and well-nourished. No distress.  HENT:  Head: Normocephalic and atraumatic.  Left Ear: Tympanic membrane, external ear and ear canal normal. No drainage, swelling or tenderness. No mastoid tenderness. No decreased hearing is noted.  Normal light reflection, visible bony landmarks.  Cardiovascular: Regular rhythm and intact distal pulses.  No murmur  heard. Pulmonary/Chest: Effort normal and breath sounds normal.  Musculoskeletal: She exhibits no edema.  Neurological: She is alert and oriented to person, place, and time. Coordination normal.  Skin: Skin is warm and dry.  Psychiatric: She has a normal  mood and affect. Judgment and thought content normal.      Assessment & Plan:   Head congestion - fluticasone (FLONASE) 50 MCG/ACT nasal spray; Place 2 sprays into both nostrils daily.  Dispense: 16 g; Refill: 6 Return precautions given.

## 2017-07-03 NOTE — Assessment & Plan Note (Signed)
-   esomeprazole (NEXIUM) 40 MG capsule; Take 1 capsule (40 mg total) by mouth daily at 12 noon.  Dispense: 90 capsule; Refill: 2

## 2017-07-03 NOTE — Assessment & Plan Note (Signed)
She does not like the way losartan makes her feel so she cut her does in half at home. She reports compliance with other medications. Her bp is elevated in clinic today but she says her readings are mostly normal at home. Due to her comorbidities, i'd like to see her BP under 140/90 continuously. We discussed this and we will titrate medications to try to achieve goal <140/90.  - Comprehensive metabolic panel; Future - metoprolol tartrate (LOPRESSOR) 50 MG tablet; Take 1 tablet (50 mg total) by mouth 2 (two) times daily.  Dispense: 180 tablet; Refill: 0 Increase clonidine to 0.2 mg at bedtime, continue 0.1 in am. Continue metoprolol and losartan. If we can get control of blood pressure with clonidine titration and she tolerates this well, may consider discontinuing losartan as she does not like the side effects of the medication.

## 2017-07-03 NOTE — Assessment & Plan Note (Signed)
Started on vitamin D therapy this past march. Recheck level. - Vitamin D 1,25 dihydroxy; Future

## 2017-07-03 NOTE — Assessment & Plan Note (Signed)
She is fasting today, last checked in March and referral to lipid clinic was made but she did not receive additional information. She has intolerance to many cholesterol medications in past. Will recheck lipids today and re-address need for medication changes/follow up. We discussed diet and exercise in maintenance of her chronic conditions. - Lipid panel; Future

## 2017-07-03 NOTE — Patient Instructions (Addendum)
Please head downstairs for lab work/x-rays.  For your ear pain and nasal drainage, I have sent you a prescription for flonase, 2 sprays in both nostrils daily Please follow up if your symptoms don't improve, or if you develop fevers or headaches.  Please increase your clonidine to 0.2 mg in the evening and continue 0.1mg  in the morning. Please try to check your blood pressure once daily or at least a few times a week, at the same time each day, and keep a log. Id like to see you back in about 1 month with your readings, and we can decide if we can decrease the losartan at that time.  It was nice to meet you. Thanks for letting me take care of you today :)

## 2017-07-05 LAB — HM MAMMOGRAPHY

## 2017-07-06 LAB — VITAMIN D 1,25 DIHYDROXY
Vitamin D 1, 25 (OH)2 Total: 53 pg/mL (ref 18–72)
Vitamin D2 1, 25 (OH)2: <8 pg/mL
Vitamin D3 1, 25 (OH)2: 53 pg/mL

## 2017-07-11 ENCOUNTER — Encounter: Payer: Self-pay | Admitting: Nurse Practitioner

## 2017-07-12 ENCOUNTER — Other Ambulatory Visit: Payer: Self-pay | Admitting: Nurse Practitioner

## 2017-07-12 DIAGNOSIS — R945 Abnormal results of liver function studies: Principal | ICD-10-CM

## 2017-07-12 DIAGNOSIS — R7989 Other specified abnormal findings of blood chemistry: Secondary | ICD-10-CM

## 2017-07-12 NOTE — Progress Notes (Signed)
Result note 

## 2017-08-08 ENCOUNTER — Ambulatory Visit: Payer: Medicare Other | Admitting: Nurse Practitioner

## 2017-08-08 ENCOUNTER — Encounter: Payer: Self-pay | Admitting: Nurse Practitioner

## 2017-08-08 VITALS — BP 158/82 | HR 50 | Temp 98.2°F | Resp 16 | Ht 64.0 in | Wt 164.0 lb

## 2017-08-08 DIAGNOSIS — I10 Essential (primary) hypertension: Secondary | ICD-10-CM

## 2017-08-08 MED ORDER — CLONIDINE HCL 0.1 MG PO TABS: ORAL_TABLET | ORAL | 1 refills | Status: DC

## 2017-08-08 NOTE — Progress Notes (Signed)
Subjective:    Patient ID: Courtney Russell, female    DOB: 04/15/1946, 72 y.o.   MRN: [id number]  HPI  Courtney Russell presents today for a follow up of hypertension. She was seen 12/5 with elevated BP readings and medication adjustments were made. Her losartan 25 daily and lopressor 50 twice daily were maintained. Her clonidine was maintained at 0.1mg  every morning and increased from 0.1mg  every evening to 0.2 mg every evening.  Reports daily medication compliance without adverse medication effects. She brings her blood pressure log today with readings ranging from 114-137/65-80. Her blood pressure is higher in office but she does report that her blood pressure increases in medical settings, which has also has also noted in past charts. She says she is tolerating the new medication dosing well, has not noticed any continued dizziness, which she thought was related to taking a higher dose of losartan (50).  She feels well. She does have mild edema to her right ankle, which she says has been chronic for years. She Denies headaches, vision changes, chest pain, shortness of breath, fatigue.  BP Readings from Last 3 Encounters:  08/08/17 (!) 158/82  07/03/17 (!) 148/82  12/28/16 124/78    Review of Systems  See HPI  Past Medical History:  Diagnosis Date  . GERD (gastroesophageal reflux disease)   . High cholesterol   . Hypertension   . IBS (irritable bowel syndrome)   . Palpitations   . Prediabetes      Social History   Socioeconomic History  . Marital status: Married    Spouse name: Not on file  . Number of children: 3  . Years of education: 11  . Highest education level: Not on file  Social Needs  . Financial resource strain: Not on file  . Food insecurity - worry: Not on file  . Food insecurity - inability: Not on file  . Transportation needs - medical: Not on file  . Transportation needs - non-medical: Not on file  Occupational History  . Occupation: Retired  Tobacco Use    . Smoking status: Never Smoker  . Smokeless tobacco: Never Used  Substance and Sexual Activity  . Alcohol use: No  . Drug use: No  . Sexual activity: Not on file  Other Topics Concern  . Not on file  Social History Narrative   Fun: Travel and read   Denies abuse and feels safe at home.     Past Surgical History:  Procedure Laterality Date  . ABDOMINAL HYSTERECTOMY  1992   fibroids  . BILATERAL SALPINGOOPHORECTOMY  1996   cysts  . BREAST SURGERY     duct excision  . CHOLECYSTECTOMY    . COLONOSCOPY  1998, 2008   negative;Dr Olevia Perches  . HERNIA REPAIR    . TONSILLECTOMY    . TUBAL LIGATION      Family History  Problem Relation Age of Onset  . Cancer Father        COLON & PROSTATE  . Diabetes Father   . Alzheimer's disease Father   . Heart attack Father        MI in late 5s; CABG @ 2  . Stroke Father        in 34s  . Dementia Mother   . Transient ischemic attack Mother        TIAs; AVM leaked in 14s  . Colon polyps Mother   . Coronary artery disease Sister        stent @  59  . Breast cancer Sister   . Diabetes Paternal Aunt     Allergies  Allergen Reactions  . Fish Oil     REACTION: rash  . Codeine     Mental status changes  . Crestor [Rosuvastatin]     Elevated LFTs  . Vytorin [Ezetimibe-Simvastatin]     Elevated LFTs  . Hydrochlorothiazide     REACTION: low potassium level    Current Outpatient Medications on File Prior to Visit  Medication Sig Dispense Refill  . aspirin 81 MG tablet Take 81 mg by mouth 1 dose over 46 hours.      . cloNIDine (CATAPRES) 0.1 MG tablet Take 1 tablet (0.1 mg total) by mouth 2 (two) times daily. 180 tablet 3  . esomeprazole (NEXIUM) 40 MG capsule Take 1 capsule (40 mg total) by mouth daily at 12 noon. 90 capsule 2  . ezetimibe (ZETIA) 10 MG tablet Take 1 tablet (10 mg total) by mouth daily. 90 tablet 3  . fluticasone (FLONASE) 50 MCG/ACT nasal spray Place 2 sprays into both nostrils daily. 16 g 6  . glucose blood test  strip 1 each by Other route as needed. Use as instructed     . Lancets (FREESTYLE) lancets 1 each by Other route as needed. Use as instructed     . losartan (COZAAR) 50 MG tablet Take 1 tablet (50 mg total) by mouth daily. (Patient taking differently: Take 50 mg by mouth daily. Taking 1/2 daily) 90 tablet 3  . metoprolol tartrate (LOPRESSOR) 50 MG tablet Take 1 tablet (50 mg total) by mouth 2 (two) times daily. 180 tablet 0   No current facility-administered medications on file prior to visit.    BP (!) 158/82 (BP Location: Left Arm, Patient Position: Sitting, Cuff Size: Large)   Pulse (!) 50   Temp 98.2 F (36.8 C) (Oral)   Resp 16   Ht 5\' 4"  (1.626 m)   Wt 164 lb (74.4 kg)   SpO2 97%   BMI 28.15 kg/m      Objective:   Physical Exam  Constitutional: She is oriented to person, place, and time. She appears well-developed and well-nourished. No distress.  HENT:  Head: Normocephalic and atraumatic.  Cardiovascular: Normal rate, normal heart sounds and intact distal pulses.  Pulmonary/Chest: Effort normal and breath sounds normal.  Musculoskeletal:  Mild nonpitting edema to right ankle  Neurological: She is alert and oriented to person, place, and time. Coordination normal.  Skin: Skin is warm and dry.  Psychiatric: She has a normal mood and affect. Judgment and thought content normal.  Vitals reviewed.     Assessment & Plan:  RTC around March for annual physical.

## 2017-08-08 NOTE — Assessment & Plan Note (Signed)
Essential hypertension Blood pressure readings at home below goal, even some readings in 110s/60s. She has had history of elevated readings in medical settings. She denies any adverse effects to medications at todays visit She will continue losartan 25 daily, metoprolol 50 twice daily, clonidine .02mg  in the evening and 0.1mg  in the morning as she has been taking over past month. She will bring home blood pressure cuff to next visit for calibration and continue to monitor BP at home. Medications ordered: - cloNIDine (CATAPRES) 0.1 MG tablet; Take 1 tablet (0.1mg ) every morning and 2 tablets (0.2mg ) every evening.  Dispense: 270 tablet; Refill: 1

## 2017-08-08 NOTE — Patient Instructions (Addendum)
Your blood pressure readings look much better! Next time you come, bring your blood pressure cuff if you'd like, and we can check it against ours to make sure its accurate.  Please continue losartan 25 daily, metoprolol 50 twice daily, clonidine .02mg  in the evening and 0.1mg  in the morning.   Ill see you back around march for your annual physical, or sooner if you need me.  It was good to see you. Thanks for letting me take care of you today :)

## 2017-08-29 ENCOUNTER — Other Ambulatory Visit: Payer: Medicare Other

## 2017-09-18 ENCOUNTER — Other Ambulatory Visit: Payer: Medicare Other

## 2017-09-26 ENCOUNTER — Ambulatory Visit
Admission: RE | Admit: 2017-09-26 | Discharge: 2017-09-26 | Disposition: A | Discharge status: Home / Self Care | Payer: Medicare Other | Source: Ambulatory Visit | Attending: Nurse Practitioner | Admitting: Nurse Practitioner

## 2017-09-26 DIAGNOSIS — R7989 Other specified abnormal findings of blood chemistry: Secondary | ICD-10-CM

## 2017-09-26 DIAGNOSIS — R945 Abnormal results of liver function studies: Principal | ICD-10-CM

## 2017-10-04 ENCOUNTER — Other Ambulatory Visit: Payer: Self-pay

## 2017-10-04 DIAGNOSIS — I10 Essential (primary) hypertension: Secondary | ICD-10-CM

## 2017-10-04 MED ORDER — METOPROLOL TARTRATE 50 MG PO TABS: 50.0000 mg | ORAL_TABLET | Freq: Two times a day (BID) | ORAL | 2 refills | Status: DC

## 2017-10-11 ENCOUNTER — Telehealth: Payer: Self-pay | Admitting: Nurse Practitioner

## 2017-10-11 DIAGNOSIS — I10 Essential (primary) hypertension: Secondary | ICD-10-CM

## 2017-10-11 NOTE — Telephone Encounter (Signed)
Copied from [place]. Topic: Quick Communication - Rx Refill/Question >> Oct 11, 2017 10:53 AM Arletha Grippe wrote: Medication: losartan    Has the patient contacted their pharmacy? Yes.     (Agent: If no, request that the patient contact the pharmacy for the refill.)   Preferred Pharmacy (with phone number or street name): [facility]  - [place] - her medicine is on recall, pt would like to change and be on something else.  She has heard many bad things about this medicine.     Agent: Please be advised that RX refills may take up to 3 business days. We ask that you follow-up with your pharmacy.

## 2017-10-11 NOTE — Telephone Encounter (Signed)
Patient states that she has tried amlodipine before and it does not work for her. Wanted to see what you thought about just trying the clonidine and metoprolol to see if they can keep BP controlled. If not she would like an alternative sent in please. She would like for you to send a message through my chart if she does not hear anything today before 5 by phone.

## 2017-10-11 NOTE — Telephone Encounter (Signed)
This encounter was created in error - please disregard.

## 2017-10-11 NOTE — Telephone Encounter (Signed)
Patient called about the losartan, she says "I received a letter from [facility] that the losartan I'm on is recalled. They said they were sending in a request for something different. Do I need to keep taking the losartan until I get something else?" I advised this would be sent to the provider for review and she will be notified with her recommendations, patient verbalized understanding.  Med request: Needs med to replace recalled losartan. PCP: Gayla Medicus Pharmacy: [facility] [address], [place] - [address] [place] [address] [phone] (Phone) [phone] (Fax)

## 2017-10-11 NOTE — Telephone Encounter (Signed)
Discontinue losartan. Start amlodipine 5 mg daily . Continue other BP meds as prescribed. Monitor Bp readings at home with goal less than 140/90. RTC in 2 weeks in F/U of medication change

## 2017-10-14 MED ORDER — CLONIDINE HCL 0.1 MG PO TABS: ORAL_TABLET | ORAL | 1 refills | Status: DC

## 2017-10-14 NOTE — Telephone Encounter (Signed)
Okay we can try to increase her clonidine to 2 tablets-0.2mg  twice daily in the morning and in the evening.  Continue metoprolol. Please have her call back at the end of the week with readings and would still like her to follow up in 2 weeks for OV to see how she is doing

## 2017-10-14 NOTE — Telephone Encounter (Signed)
Sent response through mychart.  

## 2017-12-05 ENCOUNTER — Telehealth: Payer: Self-pay | Admitting: Nurse Practitioner

## 2017-12-05 DIAGNOSIS — I10 Essential (primary) hypertension: Secondary | ICD-10-CM

## 2017-12-05 NOTE — Telephone Encounter (Signed)
Copied from [place] [id number]. Topic: Appointment Scheduling - Scheduling Inquiry for Clinic >> Dec 05, 2017  9:46 AM Rutherford Nail, NT wrote: Reason for CRM: Patient calling and is trying to schedule a medication check/med refill visit with Caesar Chestnut for her blood pressure medication. Offered her May 13 at 11:00am and she said it did not work with her schedule. Also offered June 19 and she said that theres no point in coming then since she has her physical June 20. States that she will be out of her medication in a few weeks and did not know if Caesar Chestnut would want to keep her on that medication. Please advise. CB#: (H) [phone]  (C) [phone]

## 2017-12-05 NOTE — Telephone Encounter (Signed)
Copied from [place] [id number]. Topic: Appointment Scheduling - Scheduling Inquiry for Clinic >> Dec 05, 2017  9:46 AM Rutherford Nail, NT wrote: Reason for CRM: Patient calling and is trying to schedule a medication check/med refill visit with Caesar Chestnut for her blood pressure medication. Offered her May 13 at 11:00am and she said it did not work with her schedule. Also offered June 19 and she said that theres no point in coming then since she has her physical June 20. States that she will be out of her medication in a few weeks and did not know if Caesar Chestnut would want to keep her on that medication. Please advise. CB#: (H) [phone]  (C) [phone]

## 2017-12-06 MED ORDER — CLONIDINE HCL 0.1 MG PO TABS: ORAL_TABLET | ORAL | 0 refills | Status: DC

## 2017-12-06 NOTE — Telephone Encounter (Signed)
I have sent 30 day supply of clonidine to get pt to her CPE on 12/16/17. Pt is aware.

## 2018-01-16 ENCOUNTER — Other Ambulatory Visit (INDEPENDENT_AMBULATORY_CARE_PROVIDER_SITE_OTHER): Payer: Medicare Other

## 2018-01-16 ENCOUNTER — Ambulatory Visit (INDEPENDENT_AMBULATORY_CARE_PROVIDER_SITE_OTHER): Payer: Medicare Other | Admitting: Nurse Practitioner

## 2018-01-16 ENCOUNTER — Encounter: Payer: Self-pay | Admitting: Nurse Practitioner

## 2018-01-16 VITALS — BP 162/94 | HR 57 | Temp 98.1°F | Resp 16 | Ht 64.0 in | Wt 160.6 lb

## 2018-01-16 DIAGNOSIS — E782 Mixed hyperlipidemia: Secondary | ICD-10-CM

## 2018-01-16 DIAGNOSIS — Z13820 Encounter for screening for osteoporosis: Secondary | ICD-10-CM | POA: Diagnosis not present

## 2018-01-16 DIAGNOSIS — I10 Essential (primary) hypertension: Secondary | ICD-10-CM

## 2018-01-16 DIAGNOSIS — E119 Type 2 diabetes mellitus without complications: Secondary | ICD-10-CM | POA: Diagnosis not present

## 2018-01-16 DIAGNOSIS — K219 Gastro-esophageal reflux disease without esophagitis: Secondary | ICD-10-CM | POA: Diagnosis not present

## 2018-01-16 DIAGNOSIS — Z0001 Encounter for general adult medical examination with abnormal findings: Secondary | ICD-10-CM

## 2018-01-16 DIAGNOSIS — Z0000 Encounter for general adult medical examination without abnormal findings: Secondary | ICD-10-CM

## 2018-01-16 LAB — COMPREHENSIVE METABOLIC PANEL
ALBUMIN: 3.9 g/dL (ref 3.5–5.2)
ALT: 39 U/L — ABNORMAL HIGH (ref 0–35)
AST: 49 U/L — ABNORMAL HIGH (ref 0–37)
Alkaline Phosphatase: 85 U/L (ref 39–117)
BUN: 9 mg/dL (ref 6–23)
CALCIUM: 10.6 mg/dL — AB (ref 8.4–10.5)
CHLORIDE: 104 meq/L (ref 96–112)
CO2: 27 mEq/L (ref 19–32)
Creatinine, Ser: 0.82 mg/dL (ref 0.40–1.20)
GFR: 73.16 mL/min (ref >60.00)
Glucose, Bld: 152 mg/dL — ABNORMAL HIGH (ref 70–99)
POTASSIUM: 4.9 meq/L (ref 3.5–5.1)
SODIUM: 138 meq/L (ref 135–145)
Total Bilirubin: 1.1 mg/dL (ref 0.2–1.2)
Total Protein: 7.4 g/dL (ref 6.0–8.3)

## 2018-01-16 LAB — CBC
HEMATOCRIT: 43.5 % (ref 36.0–46.0)
HEMOGLOBIN: 15.1 g/dL — AB (ref 12.0–15.0)
MCHC: 34.8 g/dL (ref 30.0–36.0)
MCV: 90.6 fl (ref 78.0–100.0)
Platelets: 284 10*3/uL (ref 150.0–400.0)
RBC: 4.8 Mil/uL (ref 3.87–5.11)
RDW: 13.8 % (ref 11.5–15.5)
WBC: 6.9 10*3/uL (ref 4.0–10.5)

## 2018-01-16 LAB — MICROALBUMIN / CREATININE URINE RATIO
CREATININE, U: 45.7 mg/dL
MICROALB/CREAT RATIO: 1.5 mg/g (ref 0.0–30.0)
Microalb, Ur: <0.7 mg/dL (ref 0.0–1.9)

## 2018-01-16 LAB — LIPID PANEL
CHOLESTEROL: 203 mg/dL — AB (ref 0–200)
HDL: 52.7 mg/dL (ref >39.00)
LDL CALC: 122 mg/dL — AB (ref 0–99)
NonHDL: 150.31
Total CHOL/HDL Ratio: 4
Triglycerides: 140 mg/dL (ref 0.0–149.0)
VLDL: 28 mg/dL (ref 0.0–40.0)

## 2018-01-16 LAB — TSH: TSH: 0.59 u[IU]/mL (ref 0.35–4.50)

## 2018-01-16 LAB — HEMOGLOBIN A1C: Hgb A1c MFr Bld: 6.9 % — ABNORMAL HIGH (ref 4.6–6.5)

## 2018-01-16 LAB — MAGNESIUM: MAGNESIUM: 2 mg/dL (ref 1.5–2.5)

## 2018-01-16 MED ORDER — LISINOPRIL 10 MG PO TABS: 10.0000 mg | ORAL_TABLET | Freq: Every day | ORAL | 3 refills | Status: DC

## 2018-01-16 MED ORDER — CLONIDINE HCL 0.1 MG PO TABS: 0.1000 mg | ORAL_TABLET | Freq: Two times a day (BID) | ORAL | 1 refills | Status: DC

## 2018-01-16 NOTE — Assessment & Plan Note (Signed)
Continue nexium Update labs - Magnesium; Future

## 2018-01-16 NOTE — Patient Instructions (Addendum)
Please head downstairs for lab work/x-rays. If any of your test results are critically abnormal, you will be contacted right away. Your results may be released to your MyChart for viewing before I am able to provide you with my response. I will contact you within a week about your test results and any recommendations for abnormalities.  Please reduce your clonidine dosage to 0.1 twice daily. Please start lisinopril 60m daily. Please continue your metoprolol as you have been taking.  Please return in about 2-3 weeks, so I can see how you are doing on the medication adjustments.  Health Maintenance, Female Adopting a healthy lifestyle and getting preventive care can go a long way to promote health and wellness. Talk with your health care provider about what schedule of regular examinations is right for you. This is a good chance for you to check in with your provider about disease prevention and staying healthy. In between checkups, there are plenty of things you can do on your own. Experts have done a lot of research about which lifestyle changes and preventive measures are most likely to keep you healthy. Ask your health care provider for more information. Weight and diet Eat a healthy diet  Be sure to include plenty of vegetables, fruits, low-fat dairy products, and lean protein.  Do not eat a lot of foods high in solid fats, added sugars, or salt.  Get regular exercise. This is one of the most important things you can do for your health. ? Most adults should exercise for at least 150 minutes each week. The exercise should increase your heart rate and make you sweat (moderate-intensity exercise). ? Most adults should also do strengthening exercises at least twice a week. This is in addition to the moderate-intensity exercise.  Maintain a healthy weight  Body mass index (BMI) is a measurement that can be used to identify possible weight problems. It estimates body fat based on height and  weight. Your health care provider can help determine your BMI and help you achieve or maintain a healthy weight.  For females 239years of age and older: ? A BMI below 18.5 is considered underweight. ? A BMI of 18.5 to 24.9 is normal. ? A BMI of 25 to 29.9 is considered overweight. ? A BMI of 30 and above is considered obese.  Watch levels of cholesterol and blood lipids  You should start having your blood tested for lipids and cholesterol at 70years of age, then have this test every 5 years.  You may need to have your cholesterol levels checked more often if: ? Your lipid or cholesterol levels are high. ? You are older than 70years of age. ? You are at high risk for heart disease.  Cancer screening Lung Cancer  Lung cancer screening is recommended for adults 533833years old who are at high risk for lung cancer because of a history of smoking.  A yearly low-dose CT scan of the lungs is recommended for people who: ? Currently smoke. ? Have quit within the past 15 years. ? Have at least a 30-pack-year history of smoking. A pack year is smoking an average of one pack of cigarettes a day for 1 year.  Yearly screening should continue until it has been 15 years since you quit.  Yearly screening should stop if you develop a health problem that would prevent you from having lung cancer treatment.  Breast Cancer  Practice breast self-awareness. This means understanding how your breasts normally appear and  feel.  It also means doing regular breast self-exams. Let your health care provider know about any changes, no matter how small.  If you are in your 20s or 30s, you should have a clinical breast exam (CBE) by a health care provider every 1-3 years as part of a regular health exam.  If you are 78 or older, have a CBE every year. Also consider having a breast X-ray (mammogram) every year.  If you have a family history of breast cancer, talk to your health care provider about genetic  screening.  If you are at high risk for breast cancer, talk to your health care provider about having an MRI and a mammogram every year.  Breast cancer gene (BRCA) assessment is recommended for women who have family members with BRCA-related cancers. BRCA-related cancers include: ? Breast. ? Ovarian. ? Tubal. ? Peritoneal cancers.  Results of the assessment will determine the need for genetic counseling and BRCA1 and BRCA2 testing.  Cervical Cancer Your health care provider may recommend that you be screened regularly for cancer of the pelvic organs (ovaries, uterus, and vagina). This screening involves a pelvic examination, including checking for microscopic changes to the surface of your cervix (Pap test). You may be encouraged to have this screening done every 3 years, beginning at age 13.  For women ages 50-65, health care providers may recommend pelvic exams and Pap testing every 3 years, or they may recommend the Pap and pelvic exam, combined with testing for human papilloma virus (HPV), every 5 years. Some types of HPV increase your risk of cervical cancer. Testing for HPV may also be done on women of any age with unclear Pap test results.  Other health care providers may not recommend any screening for nonpregnant women who are considered low risk for pelvic cancer and who do not have symptoms. Ask your health care provider if a screening pelvic exam is right for you.  If you have had past treatment for cervical cancer or a condition that could lead to cancer, you need Pap tests and screening for cancer for at least 20 years after your treatment. If Pap tests have been discontinued, your risk factors (such as having a new sexual partner) need to be reassessed to determine if screening should resume. Some women have medical problems that increase the chance of getting cervical cancer. In these cases, your health care provider may recommend more frequent screening and Pap  tests.  Colorectal Cancer  This type of cancer can be detected and often prevented.  Routine colorectal cancer screening usually begins at 70 years of age and continues through 70 years of age.  Your health care provider may recommend screening at an earlier age if you have risk factors for colon cancer.  Your health care provider may also recommend using home test kits to check for hidden blood in the stool.  A small camera at the end of a tube can be used to examine your colon directly (sigmoidoscopy or colonoscopy). This is done to check for the earliest forms of colorectal cancer.  Routine screening usually begins at age 46.  Direct examination of the colon should be repeated every 5-10 years through 70 years of age. However, you may need to be screened more often if early forms of precancerous polyps or small growths are found.  Skin Cancer  Check your skin from head to toe regularly.  Tell your health care provider about any new moles or changes in moles, especially if there  is a change in a mole's shape or color.  Also tell your health care provider if you have a mole that is larger than the size of a pencil eraser.  Always use sunscreen. Apply sunscreen liberally and repeatedly throughout the day.  Protect yourself by wearing long sleeves, pants, a wide-brimmed hat, and sunglasses whenever you are outside.  Heart disease, diabetes, and high blood pressure  High blood pressure causes heart disease and increases the risk of stroke. High blood pressure is more likely to develop in: ? People who have blood pressure in the high end of the normal range (130-139/85-89 mm Hg). ? People who are overweight or obese. ? People who are African American.  If you are 52-32 years of age, have your blood pressure checked every 3-5 years. If you are 91 years of age or older, have your blood pressure checked every year. You should have your blood pressure measured twice-once when you are at  a hospital or clinic, and once when you are not at a hospital or clinic. Record the average of the two measurements. To check your blood pressure when you are not at a hospital or clinic, you can use: ? An automated blood pressure machine at a pharmacy. ? A home blood pressure monitor.  If you are between 59 years and 39 years old, ask your health care provider if you should take aspirin to prevent strokes.  Have regular diabetes screenings. This involves taking a blood sample to check your fasting blood sugar level. ? If you are at a normal weight and have a low risk for diabetes, have this test once every three years after 70 years of age. ? If you are overweight and have a high risk for diabetes, consider being tested at a younger age or more often. Preventing infection Hepatitis B  If you have a higher risk for hepatitis B, you should be screened for this virus. You are considered at high risk for hepatitis B if: ? You were born in a country where hepatitis B is common. Ask your health care provider which countries are considered high risk. ? Your parents were born in a high-risk country, and you have not been immunized against hepatitis B (hepatitis B vaccine). ? You have HIV or AIDS. ? You use needles to inject street drugs. ? You live with someone who has hepatitis B. ? You have had sex with someone who has hepatitis B. ? You get hemodialysis treatment. ? You take certain medicines for conditions, including cancer, organ transplantation, and autoimmune conditions.  Hepatitis C  Blood testing is recommended for: ? Everyone born from [birth year] through 1963. ? Anyone with known risk factors for hepatitis C.  Sexually transmitted infections (STIs)  You should be screened for sexually transmitted infections (STIs) including gonorrhea and chlamydia if: ? You are sexually active and are younger than 70 years of age. ? You are older than 70 years of age and your health care provider tells  you that you are at risk for this type of infection. ? Your sexual activity has changed since you were last screened and you are at an increased risk for chlamydia or gonorrhea. Ask your health care provider if you are at risk.  If you do not have HIV, but are at risk, it may be recommended that you take a prescription medicine daily to prevent HIV infection. This is called pre-exposure prophylaxis (PrEP). You are considered at risk if: ? You are sexually active and do  not regularly use condoms or know the HIV status of your partner(s). ? You take drugs by injection. ? You are sexually active with a partner who has HIV.  Talk with your health care provider about whether you are at high risk of being infected with HIV. If you choose to begin PrEP, you should first be tested for HIV. You should then be tested every 3 months for as long as you are taking PrEP. Pregnancy  If you are premenopausal and you may become pregnant, ask your health care provider about preconception counseling.  If you may become pregnant, take 400 to 800 micrograms (mcg) of folic acid every day.  If you want to prevent pregnancy, talk to your health care provider about birth control (contraception). Osteoporosis and menopause  Osteoporosis is a disease in which the bones lose minerals and strength with aging. This can result in serious bone fractures. Your risk for osteoporosis can be identified using a bone density scan.  If you are 1 years of age or older, or if you are at risk for osteoporosis and fractures, ask your health care provider if you should be screened.  Ask your health care provider whether you should take a calcium or vitamin D supplement to lower your risk for osteoporosis.  Menopause may have certain physical symptoms and risks.  Hormone replacement therapy may reduce some of these symptoms and risks. Talk to your health care provider about whether hormone replacement therapy is right for  you. Follow these instructions at home:  Schedule regular health, dental, and eye exams.  Stay current with your immunizations.  Do not use any tobacco products including cigarettes, chewing tobacco, or electronic cigarettes.  If you are pregnant, do not drink alcohol.  If you are breastfeeding, limit how much and how often you drink alcohol.  Limit alcohol intake to no more than 1 drink per day for nonpregnant women. One drink equals 12 ounces of beer, 5 ounces of wine, or 1 ounces of hard liquor.  Do not use street drugs.  Do not share needles.  Ask your health care provider for help if you need support or information about quitting drugs.  Tell your health care provider if you often feel depressed.  Tell your health care provider if you have ever been abused or do not feel safe at home. This information is not intended to replace advice given to you by your health care provider. Make sure you discuss any questions you have with your health care provider. Document Released: 01/29/2011 Document Revised: 12/22/2015 Document Reviewed: 04/19/2015 Elsevier Interactive Patient Education  [employer].

## 2018-01-16 NOTE — Assessment & Plan Note (Signed)
Continue zetia, asa She is fasting Update labs - Lipid panel; Future - TSH; Future

## 2018-01-16 NOTE — Assessment & Plan Note (Addendum)
-  USPSTF grade A and B recommendations reviewed with patient; age-appropriate recommendations, preventive care, screening tests, etc discussed and encouraged; healthy living and sunscreen use  encouraged; see AVS for patient education given to patient. Declines advanced directives packet. -Discussed importance of 150 minutes of physical activity weekly, eat 6 servings of fruit/vegetables daily and drink plenty of water and avoid sweet beverages.  -Red flags and when to present for emergency care or RTC including fever >101.43F, chest pain, shortness of breath, new/worsening/un-resolving symptoms, reviewed with patient at time of visit. Follow up and care instructions discussed and provided in AVS.  -Reviewed Health Maintenance:  Reports recent ophthalmology exam, has requested that her records be sent to [place] - Hemoglobin A1c; Future - Microalbumin / creatinine urine ratio; Future - HM DIABETES FOOT EXAM; Future Screening for osteoporosis- DG Bone Density; Future-she plans to schedule bone scan with mammogram at [facility] this coming December 2019, order placed

## 2018-01-16 NOTE — Progress Notes (Signed)
Name: Courtney Russell   MRN: [id number]    DOB: Apr 14, 1946   Date:01/16/2018       Progress Note  Subjective  Chief Complaint  Chief Complaint  Patient presents with  . CPE    fasting, BP elevated    HPI  Patient presents for annual CPE.  Diet, Exercise: no routine exercise but tries to be active around the house, often eats too many sweets  USPSTF grade A and B recommendations  Depression: denies concerns Depression screen [facility] 2/9 09/27/2016 09/27/2015 09/21/2013 09/18/2012  Decreased Interest 0 0 0 1  Down, Depressed, Hopeless 0 0 0 1  PHQ - 2 Score 0 0 0 2  Altered sleeping - - - 3  Tired, decreased energy - - - 1  Change in appetite - - - 0  Feeling bad or failure about yourself  - - - 0  Trouble concentrating - - - 0  Moving slowly or fidgety/restless - - - 0  Suicidal thoughts - - - 0  PHQ-9 Score - - - 6   Hypertension -maintained on lopressor 50 BID, clonidine 0.2mg  BID. She stopped taking her losartan due to recalls- several months ago and her clonidine dosage was increased at that time. Reports she feels like the clonidine has altered her sleep schedule, some insomnia and feeling more fatigued since dosage adjustment. Recent home readings 140s-160s/70s-80s.  BP Readings from Last 3 Encounters:  01/16/18 (!) 162/94  08/08/17 (!) 158/82  07/03/17 (!) 148/82   Obesity: Wt Readings from Last 3 Encounters:  01/16/18 160 lb 9.6 oz (72.8 kg)  08/08/17 164 lb (74.4 kg)  07/03/17 165 lb 6.4 oz (75 kg)   BMI Readings from Last 3 Encounters:  01/16/18 27.57 kg/m  08/08/17 28.15 kg/m  07/03/17 28.39 kg/m    Alcohol: no Tobacco use: no, never  hep C: screening done STD testing and prevention:  no concerns, declines  Intimate partner violence: denies, feels safe  Incontinence Symptoms: denies  Vaccinations:up to date  Advanced Care Planning: A voluntary discussion about advance care planning including the explanation and discussion of advance directives.   Discussed health care proxy and Living will, and the patient DOES NOT  have a living will at present time. If patient does have living will, I have requested they bring this to the clinic to be scanned in to their chart.  Breast cancer: mammogram up to date-[facility]  Date Value Ref Range Status  07/05/2017 0-4 Bi-Rad 0-4 Bi-Rad, Self Reported Normal Final    Comment:    Category 1: Negative    Osteoporosis: last bone scan 2017, order placed to repeat today  Lipids: maintained on zetia 10 daily, tolerates well Lab Results  Component Value Date   CHOL 184 07/03/2017   CHOL 206 (H) 09/27/2016   CHOL 203 (H) 09/27/2015   Lab Results  Component Value Date   HDL 49.30 07/03/2017   HDL 49.80 09/27/2016   HDL 53.50 09/27/2015   Lab Results  Component Value Date   LDLCALC 104 (H) 07/03/2017   LDLCALC 125 (H) 09/27/2016   LDLCALC 124 (H) 09/27/2015   Lab Results  Component Value Date   TRIG 152.0 (H) 07/03/2017   TRIG 156.0 (H) 09/27/2016   TRIG 128.0 09/27/2015   Lab Results  Component Value Date   CHOLHDL 4 07/03/2017   CHOLHDL 4 09/27/2016   CHOLHDL 4 09/27/2015   Lab Results  Component Value Date   LDLDIRECT 185.0 09/21/2013   LDLDIRECT  172.2 04/26/2010    Glucose:  Glucose, Bld  Date Value Ref Range Status  07/03/2017 129 (H) 70 - 99 mg/dL Final  09/27/2016 143 (H) 70 - 99 mg/dL Final  09/27/2015 131 (H) 70 - 99 mg/dL Final   Skin cancer: routinely wears sunscreen Colorectal cancer:  No personal history of colon ca, father had colon cancer, no abdominal pain, no bowel changes, no rectal bleeding. cologuard up to date.  Aspirin: 81 daily ECG: not indicated   Patient Active Problem List   Diagnosis Date Noted  . Vitamin D deficiency 10/01/2016  . Routine general medical examination at a health care facility 09/27/2015  . Medicare annual wellness visit, subsequent 09/27/2015  . Urinary urgency 09/27/2015  . Hypercalcemia 09/22/2013  . Nonspecific  abnormal electrocardiogram (ECG) (EKG) 09/18/2012  . Irritable bowel disease 11/22/2010  . NIPPLE DISCHARGE 08/29/2010  . ANXIETY STATE, UNSPECIFIED 04/26/2010  . GERD 04/26/2010  . Type 2 diabetes mellitus ([place]) 04/21/2009  . POSTMENOPAUSAL SYNDROME 04/21/2009  . NONSPEC ELEVATION OF LEVELS OF TRANSAMINASE/LDH 04/21/2009  . Hyperlipidemia 03/24/2007  . Essential hypertension 03/24/2007    Past Surgical History:  Procedure Laterality Date  . ABDOMINAL HYSTERECTOMY  1992   fibroids  . BILATERAL SALPINGOOPHORECTOMY  1996   cysts  . BREAST SURGERY     duct excision  . CHOLECYSTECTOMY    . COLONOSCOPY  1998, 2008   negative;Dr Olevia Perches  . HERNIA REPAIR    . TONSILLECTOMY    . TUBAL LIGATION      Family History  Problem Relation Age of Onset  . Cancer Father        COLON & PROSTATE  . Diabetes Father   . Alzheimer's disease Father   . Heart attack Father        MI in late 65s; CABG @ 60  . Stroke Father        in 50s  . Dementia Mother   . Transient ischemic attack Mother        TIAs; AVM leaked in 38s  . Colon polyps Mother   . Coronary artery disease Sister        stent @ 35  . Breast cancer Sister   . Diabetes Paternal Aunt     Social History   Socioeconomic History  . Marital status: Married    Spouse name: Not on file  . Number of children: 3  . Years of education: 68  . Highest education level: Not on file  Occupational History  . Occupation: Retired  Scientific laboratory technician  . Financial resource strain: Not on file  . Food insecurity:    Worry: Not on file    Inability: Not on file  . Transportation needs:    Medical: Not on file    Non-medical: Not on file  Tobacco Use  . Smoking status: Never Smoker  . Smokeless tobacco: Never Used  Substance and Sexual Activity  . Alcohol use: No  . Drug use: No  . Sexual activity: Not on file  Lifestyle  . Physical activity:    Days per week: Not on file    Minutes per session: Not on file  . Stress: Not on file   Relationships  . Social connections:    Talks on phone: Not on file    Gets together: Not on file    Attends religious service: Not on file    Active member of club or organization: Not on file    Attends meetings of clubs or  organizations: Not on file    Relationship status: Not on file  . Intimate partner violence:    Fear of current or ex partner: Not on file    Emotionally abused: Not on file    Physically abused: Not on file    Forced sexual activity: Not on file  Other Topics Concern  . Not on file  Social History Narrative   Fun: Travel and read   Denies abuse and feels safe at home.      Current Outpatient Medications:  .  aspirin 81 MG tablet, Take 81 mg by mouth 1 dose over 46 hours.  , Disp: , Rfl:  .  cloNIDine (CATAPRES) 0.1 MG tablet, Take 2 tablets (0.2mg ) every morning and 2 tablets (0.2mg ) every evening., Disp: 120 tablet, Rfl: 0 .  esomeprazole (NEXIUM) 40 MG capsule, Take 1 capsule (40 mg total) by mouth daily at 12 noon., Disp: 90 capsule, Rfl: 2 .  ezetimibe (ZETIA) 10 MG tablet, Take 1 tablet (10 mg total) by mouth daily., Disp: 90 tablet, Rfl: 3 .  fluticasone (FLONASE) 50 MCG/ACT nasal spray, Place 2 sprays into both nostrils daily., Disp: 16 g, Rfl: 6 .  glucose blood test strip, 1 each by Other route as needed. Use as instructed , Disp: , Rfl:  .  Lancets (FREESTYLE) lancets, 1 each by Other route as needed. Use as instructed , Disp: , Rfl:  .  metoprolol tartrate (LOPRESSOR) 50 MG tablet, Take 1 tablet (50 mg total) by mouth 2 (two) times daily., Disp: 180 tablet, Rfl: 2  Allergies  Allergen Reactions  . Fish Oil     REACTION: rash  . Codeine     Mental status changes  . Crestor [Rosuvastatin]     Elevated LFTs  . Vytorin [Ezetimibe-Simvastatin]     Elevated LFTs  . Hydrochlorothiazide     REACTION: low potassium level     ROS  Constitutional: Negative for fever or weight change.  Respiratory: Negative for cough and shortness of breath.    Cardiovascular: Negative for chest pain or palpitations.  Gastrointestinal: Negative for abdominal pain, no bowel changes.  Musculoskeletal: Negative for gait problem or joint swelling.  Skin: Negative for rash.  Neurological: Negative for dizziness or headache.  No other specific complaints in a complete review of systems (except as listed in HPI above).   Objective  Vitals:   01/16/18 0756  BP: (!) 162/94  Pulse: (!) 57  Resp: 16  Temp: 98.1 F (36.7 C)  TempSrc: Oral  SpO2: 98%  Weight: 160 lb 9.6 oz (72.8 kg)  Height: 5\' 4"  (1.626 m)    Body mass index is 27.57 kg/m.  Physical Exam Vital signs reviewed. Constitutional: Patient appears well-developed and well-nourished. No distress.  HENT: Head: Normocephalic and atraumatic. Ears: B TMs ok, no erythema or effusion; Nose: Nose normal. Mouth/Throat: Oropharynx is clear and moist. No oropharyngeal exudate.  Eyes: Conjunctivae and EOM are normal. Pupils are equal, round, and reactive to light. No scleral icterus.  Neck: Normal range of motion. Neck supple. No cervical adenopathy. No thyromegaly present.  Cardiovascular: Normal rate, regular rhythm and normal heart sounds.  No murmur heard. No BLE edema. Distal pulses intact Pulmonary/Chest: Effort normal and breath sounds normal. No respiratory distress. Abdominal: Soft. Bowel sounds are normal, no distension. There is no tenderness. no masses Breast: defd to mammo Musculoskeletal: Normal range of motion,  No gross deformities Neurological: She is alert and oriented to person, place, and time. No cranial  nerve deficit. Coordination, balance, strength, speech and gait are normal.  Skin: Skin is warm and dry. No rash noted. No erythema.  Psychiatric: Patient has a normal mood and affect. behavior is normal. Judgment and thought content normal.   Diabetic Foot Exam: Diabetic Foot Exam - Simple   Simple Foot Form Visual Inspection No deformities, no ulcerations, no other  skin breakdown bilaterally:  Yes Sensation Testing Intact to touch and monofilament testing bilaterally:  Yes Pulse Check Posterior Tibialis and Dorsalis pulse intact bilaterally:  Yes Comments    Assessment & Plan RTC in 2-3 weeks for F/U: HTN-starting lisinopril, reducing clonidine, continuing metoprolol, recheck BMET

## 2018-01-16 NOTE — Assessment & Plan Note (Signed)
Currently maintained off medications Update labs - Hemoglobin A1c; Future - Microalbumin / creatinine urine ratio; Future - HM DIABETES FOOT EXAM; Future

## 2018-01-16 NOTE — Assessment & Plan Note (Signed)
BP elevated today with reported medication side effects Plan to start lisinopril-dosing and side effects discussed. Will decrease clonidine dosage and continue metoprolol dosage Continue to monitor BP readings at home RTC in 2 weeks for F/U  - CBC; Future - Comprehensive metabolic panel; Future - TSH; Future - lisinopril (PRINIVIL,ZESTRIL) 10 MG tablet; Take 1 tablet (10 mg total) by mouth daily.  Dispense: 90 tablet; Refill: 3 - cloNIDine (CATAPRES) 0.1 MG tablet; Take 1 tablet (0.1 mg total) by mouth 2 (two) times daily. Take 1 tablet twice daily.  Dispense: 60 tablet; Refill: 1

## 2018-01-17 ENCOUNTER — Other Ambulatory Visit (INDEPENDENT_AMBULATORY_CARE_PROVIDER_SITE_OTHER): Payer: Medicare Other

## 2018-01-17 DIAGNOSIS — R7989 Other specified abnormal findings of blood chemistry: Secondary | ICD-10-CM

## 2018-01-17 LAB — FERRITIN: Ferritin: 306.8 ng/mL — ABNORMAL HIGH (ref 10.0–291.0)

## 2018-01-20 ENCOUNTER — Other Ambulatory Visit: Payer: Self-pay | Admitting: Family

## 2018-01-20 ENCOUNTER — Other Ambulatory Visit: Payer: Self-pay | Admitting: Nurse Practitioner

## 2018-01-20 DIAGNOSIS — R899 Unspecified abnormal finding in specimens from other organs, systems and tissues: Secondary | ICD-10-CM

## 2018-01-20 MED ORDER — PRAVASTATIN SODIUM 40 MG PO TABS: 40.0000 mg | ORAL_TABLET | Freq: Every day | ORAL | 0 refills | Status: DC

## 2018-01-20 MED ORDER — METFORMIN HCL ER 500 MG PO TB24: 500.0000 mg | ORAL_TABLET | Freq: Every day | ORAL | 0 refills | Status: DC

## 2018-01-22 ENCOUNTER — Ambulatory Visit: Payer: Self-pay | Admitting: *Deleted

## 2018-01-22 NOTE — Telephone Encounter (Signed)
Pt called with her b/p being elevated this morning. She stated her  b/p meds had been changed on last Thursday. Her b/p this morning was 152/110 and unable to get another reading, states her machine is reading error. She states maybe a little weakness but no other symptoms. She was advised that if she started having other symptoms to call back. Pt voiced understanding and will wait for a call back from her pcp. Will route to [facility] at [facility] notified at the office.  Reason for Disposition . Systolic BP  >= 825 OR Diastolic >= 003  Answer Assessment - Initial Assessment Questions 1. BLOOD PRESSURE: "What is the blood pressure?" "Did you take at least two measurements 5 minutes apart?"     152/110 at 0745, and now 0812  The machine is giving her an error 2. ONSET: "When did you take your blood pressure?"     This morning 3. HOW: "How did you obtain the blood pressure?" (e.g., visiting nurse, automatic home BP monitor)     Automatic home BP monitor 4. HISTORY: "Do you have a history of high blood pressure?"     yes 5. MEDICATIONS: "Are you taking any medications for blood pressure?" "Have you missed any doses recently?"     Yes on meds and not missed any medications 6. OTHER SYMPTOMS: "Do you have any symptoms?" (e.g., headache, chest pain, blurred vision, difficulty breathing, weakness)     A little weakness  Protocols used: HIGH BLOOD PRESSURE-A-AH

## 2018-01-23 NOTE — Telephone Encounter (Signed)
Spoke with pt and she stated BP was better today bc she stopped lisinopril and doubled back up on clonidine. She states she was nervous yesterday because of her high reading and did not know what to do. Per Hollie Beach I advised pt to double up on lisinopril to 20 mg and go back to clonidine 1 tablet 2 times a day. Pt understood and will call back if BP still does not go down.

## 2018-01-23 NOTE — Telephone Encounter (Signed)
Can we call her to check on her today What was her blood pressure reading today

## 2018-02-03 ENCOUNTER — Ambulatory Visit (INDEPENDENT_AMBULATORY_CARE_PROVIDER_SITE_OTHER)
Admission: RE | Admit: 2018-02-03 | Discharge: 2018-02-03 | Disposition: A | Discharge status: Home / Self Care | Payer: Medicare Other | Source: Ambulatory Visit | Attending: Nurse Practitioner | Admitting: Nurse Practitioner

## 2018-02-03 ENCOUNTER — Ambulatory Visit: Payer: Medicare Other | Admitting: Nurse Practitioner

## 2018-02-03 ENCOUNTER — Encounter: Payer: Self-pay | Admitting: Nurse Practitioner

## 2018-02-03 VITALS — BP 160/86 | HR 57 | Temp 98.2°F | Resp 16 | Ht 64.0 in | Wt 159.0 lb

## 2018-02-03 DIAGNOSIS — E119 Type 2 diabetes mellitus without complications: Secondary | ICD-10-CM

## 2018-02-03 DIAGNOSIS — E782 Mixed hyperlipidemia: Secondary | ICD-10-CM | POA: Diagnosis not present

## 2018-02-03 DIAGNOSIS — M25511 Pain in right shoulder: Secondary | ICD-10-CM

## 2018-02-03 DIAGNOSIS — I10 Essential (primary) hypertension: Secondary | ICD-10-CM

## 2018-02-03 MED ORDER — LISINOPRIL 20 MG PO TABS: 20.0000 mg | ORAL_TABLET | Freq: Every day | ORAL | 1 refills | Status: DC

## 2018-02-03 MED ORDER — LISINOPRIL 20 MG PO TABS
10.0000 mg | ORAL_TABLET | Freq: Every day | ORAL | 1 refills | Status: DC
Start: 2018-02-03 — End: 2018-02-03

## 2018-02-03 MED ORDER — EZETIMIBE 10 MG PO TABS: 10.0000 mg | ORAL_TABLET | Freq: Every day | ORAL | 3 refills | Status: DC

## 2018-02-03 NOTE — Patient Instructions (Addendum)
Please increase lisinopril to 20mg  once daily Please decrease clonidine to 0.1mg  twice daily. Continue metoprolol as you have been taking it  Please try to check your blood pressure once daily or at least a few times a week, at the same time each day, and keep a log. Your blood pressure goal is <140/90  I have placed a referral to cardiology lipid clinc. Our office will begin processing this referral. Please follow up if you have not heard anything about this referral within 10 days.  Please come back and see me in about 3 weeks so I can see how your blood pressure Is doing.   Mediterranean Diet A Mediterranean diet refers to food and lifestyle choices that are based on the traditions of countries located on the [employer]. This way of eating has been shown to help prevent certain conditions and improve outcomes for people who have chronic diseases, like kidney disease and heart disease. What are tips for following this plan? Lifestyle  Cook and eat meals together with your family, when possible.  Drink enough fluid to keep your urine clear or pale yellow.  Be physically active every day. This includes: ? Aerobic exercise like running or swimming. ? Leisure activities like gardening, walking, or housework.  Get 7-8 hours of sleep each night.  If recommended by your health care provider, drink red wine in moderation. This means 1 glass a day for nonpregnant women and 2 glasses a day for men. A glass of wine equals 5 oz (150 mL). Reading food labels  Check the serving size of packaged foods. For foods such as rice and pasta, the serving size refers to the amount of cooked product, not dry.  Check the total fat in packaged foods. Avoid foods that have saturated fat or trans fats.  Check the ingredients list for added sugars, such as corn syrup. Shopping  At the grocery store, buy most of your food from the areas near the walls of the store. This includes: ? Fresh fruits and  vegetables (produce). ? Grains, beans, nuts, and seeds. Some of these may be available in unpackaged forms or large amounts (in bulk). ? Fresh seafood. ? Poultry and eggs. ? Low-fat dairy products.  Buy whole ingredients instead of prepackaged foods.  Buy fresh fruits and vegetables in-season from local farmers markets.  Buy frozen fruits and vegetables in resealable bags.  If you do not have access to quality fresh seafood, buy precooked frozen shrimp or canned fish, such as tuna, salmon, or sardines.  Buy small amounts of raw or cooked vegetables, salads, or olives from the deli or salad bar at your store.  Stock your pantry so you always have certain foods on hand, such as olive oil, canned tuna, canned tomatoes, rice, pasta, and beans. Cooking  Cook foods with extra-virgin olive oil instead of using butter or other vegetable oils.  Have meat as a side dish, and have vegetables or grains as your main dish. This means having meat in small portions or adding small amounts of meat to foods like pasta or stew.  Use beans or vegetables instead of meat in common dishes like chili or lasagna.  Experiment with different cooking methods. Try roasting or broiling vegetables instead of steaming or sauteing them.  Add frozen vegetables to soups, stews, pasta, or rice.  Add nuts or seeds for added healthy fat at each meal. You can add these to yogurt, salads, or vegetable dishes.  Marinate fish or vegetables using olive  oil, lemon juice, garlic, and fresh herbs. Meal planning  Plan to eat 1 vegetarian meal one day each week. Try to work up to 2 vegetarian meals, if possible.  Eat seafood 2 or more times a week.  Have healthy snacks readily available, such as: ? Vegetable sticks with hummus. ? [place] yogurt. ? Fruit and nut trail mix.  Eat balanced meals throughout the week. This includes: ? Fruit: 2-3 servings a day ? Vegetables: 4-5 servings a day ? Low-fat dairy: 2 servings a  day ? Fish, poultry, or lean meat: 1 serving a day ? Beans and legumes: 2 or more servings a week ? Nuts and seeds: 1-2 servings a day ? Whole grains: 6-8 servings a day ? Extra-virgin olive oil: 3-4 servings a day  Limit red meat and sweets to only a few servings a month What are my food choices?  Mediterranean diet ? Recommended ? Grains: Whole-grain pasta. Brown rice. Bulgar wheat. Polenta. Couscous. Whole-wheat bread. [place]. ? Vegetables: Artichokes. Beets. Broccoli. Cabbage. Carrots. Eggplant. Green beans. Chard. Kale. Spinach. Onions. Leeks. Peas. Squash. Tomatoes. Peppers. Radishes. ? Fruits: Apples. Apricots. Avocado. Berries. Bananas. Cherries. Dates. Figs. Grapes. Lemons. Melon. Oranges. Peaches. Plums. Pomegranate. ? Meats and other protein foods: Beans. Almonds. Sunflower seeds. Pine nuts. Peanuts. [place]. Salmon. Scallops. Shrimp. [place]. Tilapia. Clams. Oysters. Eggs. ? Dairy: Low-fat milk. Cheese. Greek yogurt. ? Beverages: Water. Red wine. Herbal tea. ? Fats and oils: Extra virgin olive oil. Avocado oil. Grape seed oil. ? Sweets and desserts: [place] yogurt with honey. Baked apples. Poached pears. Trail mix. ? Seasoning and other foods: Basil. Cilantro. Coriander. Cumin. Mint. Parsley. Sage. Rosemary. Tarragon. Garlic. Oregano. Thyme. Pepper. Balsalmic vinegar. Tahini. Hummus. Tomato sauce. Olives. Mushrooms. ? Limit these ? Grains: Prepackaged pasta or rice dishes. Prepackaged cereal with added sugar. ? Vegetables: Deep fried potatoes (french fries). ? Fruits: Fruit canned in syrup. ? Meats and other protein foods: Beef. Pork. Lamb. Poultry with skin. Hot dogs. [place] Salines. ? Dairy: Ice cream. Sour cream. Whole milk. ? Beverages: Juice. Sugar-sweetened soft drinks. Beer. Liquor and spirits. ? Fats and oils: Butter. Canola oil. Vegetable oil. Beef fat (tallow). Lard. ? Sweets and desserts: Cookies. Cakes. Pies. Candy. ? Seasoning and other foods: Mayonnaise. Premade sauces  and marinades. ? The items listed may not be a complete list. Talk with your dietitian about what dietary choices are right for you. Summary  The Mediterranean diet includes both food and lifestyle choices.  Eat a variety of fresh fruits and vegetables, beans, nuts, seeds, and whole grains.  Limit the amount of red meat and sweets that you eat.  Talk with your health care provider about whether it is safe for you to drink red wine in moderation. This means 1 glass a day for nonpregnant women and 2 glasses a day for men. A glass of wine equals 5 oz (150 mL). This information is not intended to replace advice given to you by your health care provider. Make sure you discuss any questions you have with your health care provider. Document Released: 03/08/2016 Document Revised: 04/10/2016 Document Reviewed: 03/08/2016 Elsevier Interactive Patient Education  [employer].

## 2018-02-03 NOTE — Assessment & Plan Note (Signed)
Continue to work on healthy diet and exercise Will plan to recheck A1c in about 3 months

## 2018-02-03 NOTE — Assessment & Plan Note (Signed)
Due to high ASCVD risk and intolerance to several statins now, we discussed referral to lipid clinic for further evaluation and consideration of PSCK9 therapy  Continue zetia - ezetimibe (ZETIA) 10 MG tablet; Take 1 tablet (10 mg total) by mouth daily.  Dispense: 90 tablet; Refill: 3 - AMB Referral to [facility] The 10-year ASCVD risk score Mikey Bussing DC Jr., et al., 2013) is: 33.9%   Values used to calculate the score:     Age: 72 years     Sex: Female     Is Non-Hispanic African American: No     Diabetic: Yes     Tobacco smoker: No     Systolic Blood Pressure: 689 mmHg     Is BP treated: Yes     HDL Cholesterol: 52.7 mg/dL     Total Cholesterol: 203 mg/dL

## 2018-02-03 NOTE — Assessment & Plan Note (Signed)
Again she reports fatigue noted with increased clonidine dosage. Will try decreasing clonidine back to 0.1 mg BID and resuming lisinopril at 20mg  daily, continue metoprolol at current dosage Continue to monitor BP readings at home RTC in about 3 weeks for F/U of medication adjustment, repeat BMET, fatigue - lisinopril (PRINIVIL,ZESTRIL) 20 MG tablet; Take 1 tablet (20 mg total) by mouth daily.  Dispense: 30 tablet; Refill: 1

## 2018-02-03 NOTE — Progress Notes (Signed)
Name: Courtney Russell   MRN: [id number]    DOB: 04/30/1946   Date:02/03/2018       Progress Note  Subjective  Chief Complaint  Chief Complaint  Patient presents with  . Follow-up    blood pressure     HPI Courtney Russell is here today for follow up of HTN, HLD, and diabetes. She would also like to discuss an acute complaint of shoulder pain  Hypertension -maintained on lopressor 50 BID and she was instructed to start lisinopril 10 daily and reduce clonidine to 0.1 mg BID at last OV on 6/20 due to her reports of insomnia and fatigue since her clonidine dosage was increased to 0.2 BID She says she made the medication changes as instructed for about 3 days and did notice less fatigue, but her blood pressure readings increased to 150s/100s so she decided to go back to her same regimen: lopressor 50 BID, clonidine 0.1 BID. Reports blood pressures have returned to normal, recent readings 110s-130s/70s on current medications, but again she has noticed feeling fatigued with increased clonidine dosage Denies headaches, vision changes, chest pain, shortness of breath, edema.  BP Readings from Last 3 Encounters:  02/03/18 (!) 160/86  01/16/18 (!) 162/94  08/08/17 (!) 158/82   Diabetes- after elevated A1c of 6.9 on 01/16/18 labs it was recommended that she start metformin XR 500 qd and rx was sent to pharmacy but she tells me that she did not pick up the metformin. She says she doe snot want to be on too many medications, she is very worried about side effects and since her A1c was elevated she has been more motivated to watch her diet, reduce sugar intake and trying to be more active. She would like to hold on starting metformin for now and have A1c rechecked in a few months Denies tremor, diaphoresis, polyuria, polydipsia, polyphagia.  Lab Results  Component Value Date   HGBA1C 6.9 (H) 01/16/2018   Cholesterol- maintained on zetia 10 and started on pravastatin 40 for elevated cholesterol on  01/16/18 Reports she has started the pravastatin as instructed but feels "queasy' since starting the pravastatin and also more fatigued, although she feels this may be related to clonidine  Lab Results  Component Value Date   CHOL 203 (H) 01/16/2018   HDL 52.70 01/16/2018   LDLCALC 122 (H) 01/16/2018   LDLDIRECT 185.0 09/21/2013   TRIG 140.0 01/16/2018   CHOLHDL 4 01/16/2018    Right shoulder pain- This is not a new problem She first noticed a "pulling pain" in her right shoulder about a month ago Denies any known injuries The pain seemed to be improving for some time but over last week she has noticed the pain has been worse again. No weakness, swelling, or discoloration   Patient Active Problem List   Diagnosis Date Noted  . Vitamin D deficiency 10/01/2016  . Routine general medical examination at a health care facility 09/27/2015  . Encounter for general adult medical examination with abnormal findings 09/27/2015  . Urinary urgency 09/27/2015  . Hypercalcemia 09/22/2013  . Nonspecific abnormal electrocardiogram (ECG) (EKG) 09/18/2012  . Irritable bowel disease 11/22/2010  . NIPPLE DISCHARGE 08/29/2010  . ANXIETY STATE, UNSPECIFIED 04/26/2010  . GERD 04/26/2010  . Type 2 diabetes mellitus ([place]) 04/21/2009  . POSTMENOPAUSAL SYNDROME 04/21/2009  . NONSPEC ELEVATION OF LEVELS OF TRANSAMINASE/LDH 04/21/2009  . Hyperlipidemia 03/24/2007  . Essential hypertension 03/24/2007    Past Surgical History:  Procedure Laterality Date  . ABDOMINAL HYSTERECTOMY  1992   fibroids  . BILATERAL SALPINGOOPHORECTOMY  1996   cysts  . BREAST SURGERY     duct excision  . CHOLECYSTECTOMY    . COLONOSCOPY  1998, 2008   negative;Dr Courtney Russell  . HERNIA REPAIR    . TONSILLECTOMY    . TUBAL LIGATION      Family History  Problem Relation Age of Onset  . Cancer Father        COLON & PROSTATE  . Diabetes Father   . Alzheimer's disease Father   . Heart attack Father        MI in late 43s;  CABG @ 27  . Stroke Father        in 33s  . Dementia Mother   . Transient ischemic attack Mother        TIAs; AVM leaked in 43s  . Colon polyps Mother   . Coronary artery disease Sister        stent @ 10  . Breast cancer Sister   . Diabetes Paternal Aunt     Social History   Socioeconomic History  . Marital status: Married    Spouse name: Not on file  . Number of children: 3  . Years of education: 46  . Highest education level: Not on file  Occupational History  . Occupation: Retired  Scientific laboratory technician  . Financial resource strain: Not on file  . Food insecurity:    Worry: Not on file    Inability: Not on file  . Transportation needs:    Medical: Not on file    Non-medical: Not on file  Tobacco Use  . Smoking status: Never Smoker  . Smokeless tobacco: Never Used  Substance and Sexual Activity  . Alcohol use: No  . Drug use: No  . Sexual activity: Not on file  Lifestyle  . Physical activity:    Days per week: Not on file    Minutes per session: Not on file  . Stress: Not on file  Relationships  . Social connections:    Talks on phone: Not on file    Gets together: Not on file    Attends religious service: Not on file    Active member of club or organization: Not on file    Attends meetings of clubs or organizations: Not on file    Relationship status: Not on file  . Intimate partner violence:    Fear of current or ex partner: Not on file    Emotionally abused: Not on file    Physically abused: Not on file    Forced sexual activity: Not on file  Other Topics Concern  . Not on file  Social History Narrative   Fun: Travel and read   Denies abuse and feels safe at home.      Current Outpatient Medications:  .  aspirin 81 MG tablet, Take 81 mg by mouth 1 dose over 46 hours.  , Disp: , Rfl:  .  cloNIDine (CATAPRES) 0.1 MG tablet, Take 1 tablet (0.1 mg total) by mouth 2 (two) times daily. Take 1 tablet twice daily., Disp: 60 tablet, Rfl: 1 .  esomeprazole  (NEXIUM) 40 MG capsule, Take 1 capsule (40 mg total) by mouth daily at 12 noon., Disp: 90 capsule, Rfl: 2 .  ezetimibe (ZETIA) 10 MG tablet, Take 1 tablet (10 mg total) by mouth daily., Disp: 90 tablet, Rfl: 3 .  fluticasone (FLONASE) 50 MCG/ACT nasal spray, Place 2 sprays into both nostrils daily., Disp:  16 g, Rfl: 6 .  glucose blood test strip, 1 each by Other route as needed. Use as instructed , Disp: , Rfl:  .  Lancets (FREESTYLE) lancets, 1 each by Other route as needed. Use as instructed , Disp: , Rfl:  .  lisinopril (PRINIVIL,ZESTRIL) 10 MG tablet, Take 1 tablet (10 mg total) by mouth daily., Disp: 90 tablet, Rfl: 3 .  metFORMIN (GLUCOPHAGE XR) 500 MG 24 hr tablet, Take 1 tablet (500 mg total) by mouth daily. With evening meal, Disp: 90 tablet, Rfl: 0 .  metoprolol tartrate (LOPRESSOR) 50 MG tablet, Take 1 tablet (50 mg total) by mouth 2 (two) times daily., Disp: 180 tablet, Rfl: 2 .  pravastatin (PRAVACHOL) 40 MG tablet, Take 1 tablet (40 mg total) by mouth daily., Disp: 90 tablet, Rfl: 0  Allergies  Allergen Reactions  . Fish Oil     REACTION: rash  . Codeine     Mental status changes  . Crestor [Rosuvastatin]     Elevated LFTs  . Vytorin [Ezetimibe-Simvastatin]     Elevated LFTs  . Hydrochlorothiazide     REACTION: low potassium level     ROS See HPI  Objective  Vitals:   02/03/18 1026  BP: (!) 160/86  Pulse: (!) 57  Resp: 16  Temp: 98.2 F (36.8 C)  TempSrc: Oral  SpO2: 97%  Weight: 159 lb (72.1 kg)  Height: 5\' 4"  (1.626 m)    Body mass index is 27.29 kg/m.  Physical Exam Vital signs reviewed. Constitutional: Patient appears well-developed and well-nourished. No distress.  HENT: Head: Normocephalic and atraumatic. Nose: Nose normal. Mouth/Throat: Oropharynx is clear and moist. No oropharyngeal exudate.  Eyes: Conjunctivae and EOM are normal. Pupils are equal, round, and reactive to light. No scleral icterus.  Neck: Normal range of motion. Neck  supple. Cardiovascular: Normal rate, regular rhythm and normal heart sounds.  No murmur heard. No BLE edema. Distal pulses intact Pulmonary/Chest: Effort normal and breath sounds normal. No respiratory distress. Musculoskeletal: Normal range of motion,  No gross deformities Neurological: She is alert and oriented to person, place, and time. No cranial nerve deficit. Coordination, balance, strength, speech and gait are normal.  Skin: Skin is warm and dry. No rash noted. No erythema.  Psychiatric: Patient has a normal mood and affect. behavior is normal. Judgment and thought content normal.   Assessment & Plan RTC in 3 weeks for F/U: increase lisinopril, decrease clonidine, continue metoprolol, Recheck BMET and BP log, evaluate fatigue Discussed the role of healthy diet and exercise in the management of HTN, HLD, and DM and printed additional information on AVS  Right shoulder pain, unspecified chronicity Will obtain imaging today and F/U with further recommendations pending lab results - DG Shoulder Right; Future

## 2018-02-04 ENCOUNTER — Telehealth: Payer: Self-pay

## 2018-02-24 ENCOUNTER — Other Ambulatory Visit (INDEPENDENT_AMBULATORY_CARE_PROVIDER_SITE_OTHER): Payer: Medicare Other

## 2018-02-24 ENCOUNTER — Ambulatory Visit: Payer: Medicare Other | Admitting: Nurse Practitioner

## 2018-02-24 ENCOUNTER — Encounter: Payer: Self-pay | Admitting: Nurse Practitioner

## 2018-02-24 VITALS — BP 142/90 | HR 56 | Temp 97.7°F | Resp 16 | Ht 64.0 in | Wt 157.0 lb

## 2018-02-24 DIAGNOSIS — I10 Essential (primary) hypertension: Secondary | ICD-10-CM

## 2018-02-24 LAB — BASIC METABOLIC PANEL
BUN: 11 mg/dL (ref 6–23)
CO2: 27 mEq/L (ref 19–32)
Calcium: 10.7 mg/dL — ABNORMAL HIGH (ref 8.4–10.5)
Chloride: 104 mEq/L (ref 96–112)
Creatinine, Ser: 0.81 mg/dL (ref 0.40–1.20)
GFR: 74.18 mL/min (ref >60.00)
GLUCOSE: 191 mg/dL — AB (ref 70–99)
Potassium: 4.3 mEq/L (ref 3.5–5.1)
SODIUM: 139 meq/L (ref 135–145)

## 2018-02-24 NOTE — Progress Notes (Signed)
Name: MAKIYA JEUNE   MRN: [id number]    DOB: 08/18/1945   Date:02/24/2018       Progress Note  Subjective  Chief Complaint  Chief Complaint  Patient presents with  . Follow-up    Blood pressure    HPI Ms Cozad is here today for a follow up of hypertension. Since our last OV on 02/03/18, She has scheduled an appointment with lipid clinic for further evaluation of HLD, and scheduled with hematology for further evaluation of abnormal CBC and ferritin levels on recent labs.  Hypertension -at her last OV on 01/16/18, lisinopril 20 was restarted and clonidine dosage was decreased to 0.1 BID due to complaint of fatigue awhich seemed to be associated with increasing clonidine dosage. Also maintained on metoprolol 50 BID, which she has continued Reports daily medication compliance without noted adverse medication effects, and tells me today she is feeling much better with medication adjustments- fatigue has improved, sleeping better and not having bad dreams any more, all of which she had noticed with increased clonidine dosage Brings home BP log today, with recent home readings 130s-140s/70s-80s Denies headaches, vision changes, chest pain, shortness of breath, edema.  BP Readings from Last 3 Encounters:  02/24/18 (!) 142/90  02/03/18 (!) 160/86  01/16/18 (!) 162/94    Patient Active Problem List   Diagnosis Date Noted  . Vitamin D deficiency 10/01/2016  . Routine general medical examination at a health care facility 09/27/2015  . Encounter for general adult medical examination with abnormal findings 09/27/2015  . Urinary urgency 09/27/2015  . Hypercalcemia 09/22/2013  . Nonspecific abnormal electrocardiogram (ECG) (EKG) 09/18/2012  . Irritable bowel disease 11/22/2010  . NIPPLE DISCHARGE 08/29/2010  . ANXIETY STATE, UNSPECIFIED 04/26/2010  . GERD 04/26/2010  . Type 2 diabetes mellitus ([place]) 04/21/2009  . POSTMENOPAUSAL SYNDROME 04/21/2009  . NONSPEC ELEVATION OF LEVELS OF  TRANSAMINASE/LDH 04/21/2009  . Hyperlipidemia 03/24/2007  . Essential hypertension 03/24/2007    Past Surgical History:  Procedure Laterality Date  . ABDOMINAL HYSTERECTOMY  1992   fibroids  . BILATERAL SALPINGOOPHORECTOMY  1996   cysts  . BREAST SURGERY     duct excision  . CHOLECYSTECTOMY    . COLONOSCOPY  1998, 2008   negative;Dr Olevia Perches  . HERNIA REPAIR    . TONSILLECTOMY    . TUBAL LIGATION      Family History  Problem Relation Age of Onset  . Cancer Father        COLON & PROSTATE  . Diabetes Father   . Alzheimer's disease Father   . Heart attack Father        MI in late 61s; CABG @ 42  . Stroke Father        in 63s  . Dementia Mother   . Transient ischemic attack Mother        TIAs; AVM leaked in 48s  . Colon polyps Mother   . Coronary artery disease Sister        stent @ 61  . Breast cancer Sister   . Diabetes Paternal Aunt     Social History   Socioeconomic History  . Marital status: Married    Spouse name: Not on file  . Number of children: 3  . Years of education: 23  . Highest education level: Not on file  Occupational History  . Occupation: Retired  Scientific laboratory technician  . Financial resource strain: Not on file  . Food insecurity:    Worry: Not on file  Inability: Not on file  . Transportation needs:    Medical: Not on file    Non-medical: Not on file  Tobacco Use  . Smoking status: Never Smoker  . Smokeless tobacco: Never Used  Substance and Sexual Activity  . Alcohol use: No  . Drug use: No  . Sexual activity: Not on file  Lifestyle  . Physical activity:    Days per week: Not on file    Minutes per session: Not on file  . Stress: Not on file  Relationships  . Social connections:    Talks on phone: Not on file    Gets together: Not on file    Attends religious service: Not on file    Active member of club or organization: Not on file    Attends meetings of clubs or organizations: Not on file    Relationship status: Not on file  .  Intimate partner violence:    Fear of current or ex partner: Not on file    Emotionally abused: Not on file    Physically abused: Not on file    Forced sexual activity: Not on file  Other Topics Concern  . Not on file  Social History Narrative   Fun: Travel and read   Denies abuse and feels safe at home.      Current Outpatient Medications:  .  aspirin 81 MG tablet, Take 81 mg by mouth 1 dose over 46 hours.  , Disp: , Rfl:  .  cloNIDine (CATAPRES) 0.1 MG tablet, Take 1 tablet (0.1 mg total) by mouth 2 (two) times daily. Take 1 tablet twice daily., Disp: 60 tablet, Rfl: 1 .  esomeprazole (NEXIUM) 40 MG capsule, Take 1 capsule (40 mg total) by mouth daily at 12 noon., Disp: 90 capsule, Rfl: 2 .  ezetimibe (ZETIA) 10 MG tablet, Take 1 tablet (10 mg total) by mouth daily., Disp: 90 tablet, Rfl: 3 .  fluticasone (FLONASE) 50 MCG/ACT nasal spray, Place 2 sprays into both nostrils daily., Disp: 16 g, Rfl: 6 .  glucose blood test strip, 1 each by Other route as needed. Use as instructed , Disp: , Rfl:  .  Lancets (FREESTYLE) lancets, 1 each by Other route as needed. Use as instructed , Disp: , Rfl:  .  lisinopril (PRINIVIL,ZESTRIL) 20 MG tablet, Take 1 tablet (20 mg total) by mouth daily., Disp: 30 tablet, Rfl: 1 .  metoprolol tartrate (LOPRESSOR) 50 MG tablet, Take 1 tablet (50 mg total) by mouth 2 (two) times daily., Disp: 180 tablet, Rfl: 2 .  pravastatin (PRAVACHOL) 40 MG tablet, Take 1 tablet (40 mg total) by mouth daily., Disp: 90 tablet, Rfl: 0 .  metFORMIN (GLUCOPHAGE XR) 500 MG 24 hr tablet, Take 1 tablet (500 mg total) by mouth daily. With evening meal (Patient not taking: Reported on 02/24/2018), Disp: 90 tablet, Rfl: 0  Allergies  Allergen Reactions  . Fish Oil     REACTION: rash  . Codeine     Mental status changes  . Crestor [Rosuvastatin]     Elevated LFTs  . Vytorin [Ezetimibe-Simvastatin]     Elevated LFTs  . Hydrochlorothiazide     REACTION: low potassium level      ROS See HPI  Objective  Vitals:   02/24/18 0932 02/24/18 1014  BP: (!) 156/94 (!) 142/90  Pulse: (!) 56   Resp: 16   Temp: 97.7 F (36.5 C)   TempSrc: Oral   SpO2: 98%   Weight: 157 lb (71.2 kg)  Height: 5\' 4"  (1.626 m)     Body mass index is 26.95 kg/m.  Physical Exam Vital signs reviewed. Constitutional: Patient appears well-developed and well-nourished. No distress.  HENT: Head: Normocephalic and atraumatic. Nose: Nose normal. Mouth/Throat: Oropharynx is clear and moist. No oropharyngeal exudate.  Eyes: Conjunctivae and EOM are normal. Pupils are equal, round, and reactive to light. No scleral icterus.  Neck: Normal range of motion. Neck supple. Cardiovascular: Normal rate, regular rhythm and normal heart sounds. No murmur heard.No BLE edema. Distal pulses intact Pulmonary/Chest: Effort normal and breath sounds normal. No respiratory distress. Neurological:She is alert and oriented to person, place, and time. No cranial nerve deficit. Coordination, balance, strength, speech and gait are normal.  Skin: Skin is warm and dry. No rash noted. No erythema.  Psychiatric: Patient has a normal mood and affect. behavior is normal. Judgment and thought content normal.   Assessment & Plan RTC in 2 months for F/U: DM- reck A1c, HTN- recheck BP, home log

## 2018-02-24 NOTE — Patient Instructions (Signed)
Please head downstairs for lab work. If any of your test results are critically abnormal, you will be contacted right away. Otherwise, I will contact you within a week about your test results and any recommendations for abnormalities.   Please return around September for blood pressure and diabetes follow up, or sooner for blood pressure readings >140/90

## 2018-02-24 NOTE — Assessment & Plan Note (Addendum)
BP slightly elevated on 2 readings in clinic today, with mostly normal readings at home She has a few recorded home readings with systolic 476-546, diastolic is consistently below goal on home readings We discussed medication adjustment for improved BP control but she is hesitant to make additional adjustments today as she is feeling much better on current regimen We will continue current medication dosages for now and she will continue to monitor at home RTC in 2 months for F/U to recheck BP and determine need for additional adjustments, or sooner for readings >140/90 at home - Basic metabolic panel; Future

## 2018-02-28 ENCOUNTER — Other Ambulatory Visit: Payer: Self-pay | Admitting: Nurse Practitioner

## 2018-02-28 DIAGNOSIS — R899 Unspecified abnormal finding in specimens from other organs, systems and tissues: Secondary | ICD-10-CM

## 2018-02-28 NOTE — Progress Notes (Signed)
nact

## 2018-03-07 ENCOUNTER — Other Ambulatory Visit: Payer: Self-pay | Admitting: Family

## 2018-03-07 DIAGNOSIS — R7989 Other specified abnormal findings of blood chemistry: Principal | ICD-10-CM

## 2018-03-07 DIAGNOSIS — R718 Other abnormality of red blood cells: Principal | ICD-10-CM

## 2018-03-07 DIAGNOSIS — D582 Other hemoglobinopathies: Secondary | ICD-10-CM

## 2018-03-10 ENCOUNTER — Inpatient Hospital Stay: Payer: Medicare Other | Attending: Family | Admitting: Family

## 2018-03-10 ENCOUNTER — Inpatient Hospital Stay: Payer: Medicare Other

## 2018-03-10 ENCOUNTER — Other Ambulatory Visit: Payer: Self-pay

## 2018-03-10 ENCOUNTER — Encounter: Payer: Self-pay | Admitting: Family

## 2018-03-10 VITALS — BP 160/95 | HR 57 | Temp 98.0°F | Resp 16 | Wt 157.0 lb

## 2018-03-10 DIAGNOSIS — D582 Other hemoglobinopathies: Secondary | ICD-10-CM

## 2018-03-10 DIAGNOSIS — R5383 Other fatigue: Secondary | ICD-10-CM | POA: Insufficient documentation

## 2018-03-10 DIAGNOSIS — I10 Essential (primary) hypertension: Secondary | ICD-10-CM | POA: Diagnosis not present

## 2018-03-10 DIAGNOSIS — R7303 Prediabetes: Secondary | ICD-10-CM | POA: Diagnosis not present

## 2018-03-10 DIAGNOSIS — R7989 Other specified abnormal findings of blood chemistry: Secondary | ICD-10-CM

## 2018-03-10 DIAGNOSIS — R718 Other abnormality of red blood cells: Principal | ICD-10-CM

## 2018-03-10 DIAGNOSIS — Z0001 Encounter for general adult medical examination with abnormal findings: Secondary | ICD-10-CM

## 2018-03-10 LAB — CMP (CANCER CENTER ONLY)
ALT: 44 U/L (ref 10–47)
ANION GAP: 8 (ref 5–15)
AST: 54 U/L — AB (ref 11–38)
Albumin: 3.4 g/dL — ABNORMAL LOW (ref 3.5–5.0)
Alkaline Phosphatase: 88 U/L — ABNORMAL HIGH (ref 26–84)
BUN: 10 mg/dL (ref 7–22)
CALCIUM: 11 mg/dL — AB (ref 8.0–10.3)
CO2: 29 mmol/L (ref 18–33)
Chloride: 108 mmol/L (ref 98–108)
Creatinine: 1.1 mg/dL (ref 0.60–1.20)
Glucose, Bld: 190 mg/dL — ABNORMAL HIGH (ref 73–118)
Potassium: 4.6 mmol/L (ref 3.3–4.7)
Sodium: 145 mmol/L (ref 128–145)
Total Bilirubin: 1.5 mg/dL (ref 0.2–1.6)
Total Protein: 7.2 g/dL (ref 6.4–8.1)

## 2018-03-10 LAB — CBC WITH DIFFERENTIAL (CANCER CENTER ONLY)
Basophils Absolute: 0 10*3/uL (ref 0.0–0.1)
Basophils Relative: 0 %
EOS PCT: 2 %
Eosinophils Absolute: 0.1 10*3/uL (ref 0.0–0.5)
HEMATOCRIT: 44.5 % (ref 34.8–46.6)
Hemoglobin: 14.9 g/dL (ref 11.6–15.9)
LYMPHS PCT: 35 %
Lymphs Abs: 2.5 10*3/uL (ref 0.9–3.3)
MCH: 30.3 pg (ref 26.0–34.0)
MCHC: 33.5 g/dL (ref 32.0–36.0)
MCV: 90.4 fL (ref 81.0–101.0)
Monocytes Absolute: 0.5 10*3/uL (ref 0.1–0.9)
Monocytes Relative: 7 %
Neutro Abs: 3.9 10*3/uL (ref 1.5–6.5)
Neutrophils Relative %: 56 %
Platelet Count: 271 10*3/uL (ref 145–400)
RBC: 4.92 MIL/uL (ref 3.70–5.32)
RDW: 13.1 % (ref 11.1–15.7)
WBC: 7 10*3/uL (ref 3.9–10.0)

## 2018-03-10 LAB — LACTATE DEHYDROGENASE: LDH: 150 U/L (ref 98–192)

## 2018-03-10 LAB — SAVE SMEAR

## 2018-03-10 NOTE — Progress Notes (Signed)
Hematology/Oncology Consultation   Name: Courtney Russell      MRN: [id number]    Location: Room/bed info not found  Date: 03/10/2018 Time:12:12 PM   REFERRING PHYSICIAN: Caesar Chestnut, NP  REASON FOR CONSULT: Abnormal ferritin, evaluate for hemochromatosis    DIAGNOSIS: Elevated ferritin  HISTORY OF PRESENT ILLNESS: Courtney Russell is a very pleasant 72 yo caucasian female with a mildly elevated ferritin at 306 on recent lab work. Hgb at that time was 15.1, Hct 43.5%. Hgb today is 14.9.  She he no personal history of elevated iron studies and no family history that she is aware of.  She has history of iron deficiency with syncope as a child and took oral iron.  She has had occasional episodes of palpitations and lightheadedness since she was a child.  Her sister has iron deficiency anemia secondary to GI blood loss and gets IV iron PRN.  She is occasionally symptomatic with fatigue. She attributes this to her BP medication.  She has had some pain off and on in her right shoulder but states that this seems to be improving.  She had a hysterectomy in her 74's. She has 3 adult children and no history of miscarriage.  No episodes of bleeding, no bruising or petechiae.  She states that she did the Cologard in 2017 and it was negative.  She is up to date on her mammogram, which was negative and is due again in December.  No family history of liver issues.  No personal oncology history. Father had colon cancer and sister had breast cancer.  She is prediabetic and is not on medication. She is hoping to manage this with diet and exercise. Last Hb A1c was 6.9.  No fever, chills, n/v, cough, rash, dizziness, SOB, chest pain, abdominal pain or changes in bowel or bladder habits.  No swelling, tenderness, numbness or tingling in her extremities. No c/o pain. No lymphadenopathy noted on exam.  She has a good appetite and is staying well hydrated. Her weight is stable.   She does not smoke or drink  alcoholic beverages.   ROS: All other 10 point review of systems is negative.   PAST MEDICAL HISTORY:   Past Medical History:  Diagnosis Date  . GERD (gastroesophageal reflux disease)   . High cholesterol   . Hypertension   . IBS (irritable bowel syndrome)   . Palpitations   . Prediabetes     ALLERGIES: Allergies  Allergen Reactions  . Dexbromphen-Acetaminophen Other (See Comments)    Tachycardia, and leg numbness Tachycardia, and leg numbness   . Fish Oil     REACTION: rash REACTION: rash  . Codeine Other (See Comments)    Mental status changes Mental status changes   . Bisoprolol-Hydrochlorothiazide Other (See Comments)    Dropped potassium level,hypokalemia Dropped potassium level,hypokalemia   . Crestor [Rosuvastatin]     Elevated LFTs  . Ezetimibe-Simvastatin Other (See Comments)    Elevated LFTs Makes liver enzymes too high Elevated LFTs Makes liver enzymes too high   . Hydrochlorothiazide     REACTION: low potassium level REACTION: low potassium level  . Other Palpitations    dryx oil dryx oil       MEDICATIONS:  Current Outpatient Medications on File Prior to Visit  Medication Sig Dispense Refill  . calcium carbonate (TUMS - DOSED IN MG ELEMENTAL CALCIUM) 500 MG chewable tablet Chew 1 tablet by mouth daily.    Marland Kitchen aspirin 81 MG tablet Take 81 mg by mouth 1  dose over 46 hours.      . cholecalciferol (VITAMIN D) 1000 units tablet Vitamin D    . cloNIDine (CATAPRES) 0.1 MG tablet Take 1 tablet (0.1 mg total) by mouth 2 (two) times daily. Take 1 tablet twice daily. 60 tablet 1  . esomeprazole (NEXIUM) 40 MG capsule Take 1 capsule (40 mg total) by mouth daily at 12 noon. 90 capsule 2  . ezetimibe (ZETIA) 10 MG tablet Take 1 tablet (10 mg total) by mouth daily. 90 tablet 3  . lisinopril (PRINIVIL,ZESTRIL) 20 MG tablet Take 1 tablet (20 mg total) by mouth daily. 30 tablet 1  . metoprolol tartrate (LOPRESSOR) 50 MG tablet Take 1 tablet (50 mg total) by mouth  2 (two) times daily. 180 tablet 2  . pravastatin (PRAVACHOL) 40 MG tablet Take 1 tablet (40 mg total) by mouth daily. 90 tablet 0   No current facility-administered medications on file prior to visit.      PAST SURGICAL HISTORY Past Surgical History:  Procedure Laterality Date  . ABDOMINAL HYSTERECTOMY  1992   fibroids  . BILATERAL SALPINGOOPHORECTOMY  1996   cysts  . BREAST SURGERY     duct excision  . CHOLECYSTECTOMY    . COLONOSCOPY  1998, 2008   negative;Dr Olevia Perches  . HERNIA REPAIR    . TONSILLECTOMY    . TUBAL LIGATION      FAMILY HISTORY: Family History  Problem Relation Age of Onset  . Cancer Father        COLON & PROSTATE  . Diabetes Father   . Alzheimer's disease Father   . Heart attack Father        MI in late 54s; CABG @ 7  . Stroke Father        in 71s  . Dementia Mother   . Transient ischemic attack Mother        TIAs; AVM leaked in 16s  . Colon polyps Mother   . Coronary artery disease Sister        stent @ 6  . Breast cancer Sister   . Diabetes Paternal Aunt     SOCIAL HISTORY:  reports that she has never smoked. She has never used smokeless tobacco. She reports that she does not drink alcohol or use drugs.  PERFORMANCE STATUS: The patient's performance status is 1 - Symptomatic but completely ambulatory  PHYSICAL EXAM: Most Recent Vital Signs: Blood pressure (!) 160/95, pulse (!) 57, temperature 98 F (36.7 C), temperature source Oral, resp. rate 16, weight 157 lb (71.2 kg), SpO2 97 %. BP (!) 160/95 (BP Location: Left Arm, Patient Position: Sitting)   Pulse (!) 57   Temp 98 F (36.7 C) (Oral)   Resp 16   Wt 157 lb (71.2 kg)   SpO2 97%   BMI 26.95 kg/m   General Appearance:    Alert, cooperative, no distress, appears stated age  Head:    Normocephalic, without obvious abnormality, atraumatic  Eyes:    PERRL, conjunctiva/corneas clear, EOM's intact, fundi    benign, both eyes        Throat:   Lips, mucosa, and tongue normal; teeth  and gums normal  Neck:   Supple, symmetrical, trachea midline, no adenopathy;    thyroid:  no enlargement/tenderness/nodules; no carotid   bruit or JVD  Back:     Symmetric, no curvature, ROM normal, no CVA tenderness  Lungs:     Clear to auscultation bilaterally, respirations unlabored  Chest Wall:    No  tenderness or deformity   Heart:    Regular rate and rhythm, S1 and S2 normal, no murmur, rub   or gallop     Abdomen:     Soft, non-tender, bowel sounds active all four quadrants,    no masses, no organomegaly        Extremities:   Extremities normal, atraumatic, no cyanosis or edema  Pulses:   2+ and symmetric all extremities  Skin:   Skin color, texture, turgor normal, no rashes or lesions  Lymph nodes:   Cervical, supraclavicular, and axillary nodes normal  Neurologic:   CNII-XII intact, normal strength, sensation and reflexes    throughout    LABORATORY DATA:  Results for orders placed or performed in visit on 03/10/18 (from the past 48 hour(s))  CBC with Differential ([place] Only)     Status: None   Collection Time: 03/10/18 10:35 AM  Result Value Ref Range   WBC Count 7.0 3.9 - 10.0 K/uL   RBC 4.92 3.70 - 5.32 MIL/uL   Hemoglobin 14.9 11.6 - 15.9 g/dL   HCT 44.5 34.8 - 46.6 %   MCV 90.4 81.0 - 101.0 fL   MCH 30.3 26.0 - 34.0 pg   MCHC 33.5 32.0 - 36.0 g/dL   RDW 13.1 11.1 - 15.7 %   Platelet Count 271 145 - 400 K/uL   Neutrophils Relative % 56 %   Neutro Abs 3.9 1.5 - 6.5 K/uL   Lymphocytes Relative 35 %   Lymphs Abs 2.5 0.9 - 3.3 K/uL   Monocytes Relative 7 %   Monocytes Absolute 0.5 0.1 - 0.9 K/uL   Eosinophils Relative 2 %   Eosinophils Absolute 0.1 0.0 - 0.5 K/uL   Basophils Relative 0 %   Basophils Absolute 0.0 0.0 - 0.1 K/uL    Comment: Performed at [facility] at [facility], [address] [place], [place] [address]  CMP ([place] only)     Status: Abnormal   Collection Time: 03/10/18 10:35 AM  Result Value Ref  Range   Sodium 145 128 - 145 mmol/L   Potassium 4.6 3.3 - 4.7 mmol/L   Chloride 108 98 - 108 mmol/L   CO2 29 18 - 33 mmol/L   Glucose, Bld 190 (H) 73 - 118 mg/dL   BUN 10 7 - 22 mg/dL   Creatinine 1.10 0.60 - 1.20 mg/dL   Calcium 11.0 (H) 8.0 - 10.3 mg/dL   Total Protein 7.2 6.4 - 8.1 g/dL   Albumin 3.4 (L) 3.5 - 5.0 g/dL   AST 54 (H) 11 - 38 U/L   ALT 44 10 - 47 U/L   Alkaline Phosphatase 88 (H) 26 - 84 U/L   Total Bilirubin 1.5 0.2 - 1.6 mg/dL   Anion gap 8 5 - 15    Comment: Performed at [facility] at [facility], [address] [place], [place] [address]      RADIOGRAPHY: No results found.     PATHOLOGY: None  ASSESSMENT/PLAN: Ms. Dadisman is a very pleasant 72 yo caucasian female with a very mildly elevated ferritin at 306 on recent lab work. She has history of iron deficiency anemia as a child. Hgb is stable at 14.9, MCV 90.  She has occasional episodes of palpitations, lightheadedness and fatigue.  We will see what her iron saturation and Hemochromatosis DNA testing show. Once we have her results we will determine if follow-up and treatment are  needed.   All questions were answered and she is in agreement with the plan. She will contact our office with any questions or concerns. We can certainly see her sooner if need be.   She was discussed with and also seen by Dr. Marin Olp and he is in agreement with the aforementioned.   Laverna Peace     Addendum: I saw and examined the patient with Judson Roch.  I agree with the above analysis.  I really would be surprised if she had hemochromatosis.  I suspect that the elevated ferritin is an acute phase reactant.  Her iron studies show ferritin of 47 but her iron saturation is only 35%.  I would think it be highly unlikely that she has hemochromatosis with an iron saturation of only 35%.  We will await the DNA analysis for hemochromatosis.  We should have that back in about 4-5 days.  I looked at her  blood smear.  I do not see anything on the blood smear that looked suspicious.  Her physical exam does not show anything that looks focal.  She had minimally elevated LFTs.  Again, I would not think this would be anything related to iron overload.  We will await the labs.  If she is negative for hemochromatosis, I does do not think we have to get her back to the office.  I would not think that we would be added to her medical care.  We spent about 40 minutes with her.  All the time spent face-to-face.  We answered all of her questions.  She is quite nice.  Lattie Haw, MD

## 2018-03-11 LAB — IRON AND TIBC
Iron: 110 ug/dL (ref 41–142)
SATURATION RATIOS: 35 % (ref 21–57)
TIBC: 313 ug/dL (ref 236–444)
UIBC: 203 ug/dL

## 2018-03-11 LAB — FERRITIN: FERRITIN: 487 ng/mL — AB (ref 11–307)

## 2018-03-14 LAB — HEMOCHROMATOSIS DNA-PCR(C282Y,H63D)

## 2018-03-19 ENCOUNTER — Telehealth: Payer: Self-pay | Admitting: Family

## 2018-03-19 NOTE — Telephone Encounter (Signed)
I spoke with Jakes over the phone and let her know her iron studies were ok and Hemochromatosis DNA was negative. All questions were answered and we will plan to see her back in another 4 months.

## 2018-03-19 NOTE — Telephone Encounter (Signed)
I left a message with her husband with call back number to go over lab work.

## 2018-03-25 ENCOUNTER — Ambulatory Visit (INDEPENDENT_AMBULATORY_CARE_PROVIDER_SITE_OTHER): Payer: Medicare Other

## 2018-03-25 DIAGNOSIS — E782 Mixed hyperlipidemia: Secondary | ICD-10-CM | POA: Diagnosis not present

## 2018-03-25 DIAGNOSIS — I10 Essential (primary) hypertension: Secondary | ICD-10-CM

## 2018-03-25 NOTE — Patient Instructions (Addendum)
It was great to see you today.   Your LDL was not at goal of 100mg /dL.   Continue taking pravastatin 40mg  daily and ezetimibe 10mg  daily.  Try reducing fats, red meat, and fried food from your diet as much as possible.   Start exercising a few minutes each day, working up to 20 minutes daily.   Follow up with your primary care doctor for lab work in September.

## 2018-03-25 NOTE — Progress Notes (Signed)
Patient ID: Courtney Russell                 DOB: January 06, 1946                    MRN: [id number]     HPI: Courtney Russell is a 72 y.o. female patient referred to lipid clinic by Caesar Chestnut, NP. PMH is significant for HTN, HLD, and diabetes. Her 10 year ASCVD risk is 28%. She has previously been statin intolerant to rosuvastatin 5mg  and Vytorin with mildly elevated LFTs. She presents to clinic today for lipid management.   Courtney Russell presents in good spirits. She denies any muscle pain on pravastatin 40mg . She does not recall her intolerance to rosuvastatin and Vytorin and states she was just told to stop taking them. She endorses some muscle soreness when she was on Vytorin but started taking it at night instead of the morning and reported no further symptoms. Patient eats many fried foods throughout the day. She does not currently exercise. 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 Patient was counseled on the cardiovascular benefits of diet changes and exercise.    Current Medications: pravastatin 40mg  daily, ezetimibe 10mg  daily  Intolerances: rosuvastatin 5mg  and Vytorin with mildly elevated LFTs Risk Factors: family history of premature CAD, diabetes, 10 year ASCVD 27.8% LDL goal: 100mg /dL  Diet: B: cereal, sausage, egg, bacon; L: fruit, egg, McChicken, yogurt; D: chicken pie, potatoes, okra, steak  Exercise: running around as care giver   Family History: Father - MI in 28s, CABG and stroke in 20s; Mother - TIAs in 51s   Social History: denies alcohol, tobacco or illicit drug use  Labs: 01/16/18: TC 203, HDL 53, LDL 122, TG 140 (Zetia 10mg  daily)  07/03/17: TC 184, HDL 49, LDL 104, TG 152  09/27/2016: TC 206, HDL 50, LDL 125, TG 156  Past Medical History:  Diagnosis Date  . GERD (gastroesophageal reflux disease)   . High cholesterol   . Hypertension   . IBS (irritable bowel syndrome)   .  Palpitations   . Prediabetes     Current Outpatient Medications on File Prior to Visit  Medication Sig Dispense Refill  . aspirin 81 MG tablet Take 81 mg by mouth 1 dose over 46 hours.      . calcium carbonate (TUMS - DOSED IN MG ELEMENTAL CALCIUM) 500 MG chewable tablet Chew 1 tablet by mouth daily.    . cholecalciferol (VITAMIN D) 1000 units tablet Vitamin D    . cloNIDine (CATAPRES) 0.1 MG tablet Take 1 tablet (0.1 mg total) by mouth 2 (two) times daily. Take 1 tablet twice daily. 60 tablet 1  . esomeprazole (NEXIUM) 40 MG capsule Take 1 capsule (40 mg total) by mouth daily at 12 noon. 90 capsule 2  . ezetimibe (ZETIA) 10 MG tablet Take 1 tablet (10 mg total) by mouth daily. 90 tablet 3  . lisinopril (PRINIVIL,ZESTRIL) 20 MG tablet Take 1 tablet (20 mg total) by mouth daily. 30 tablet 1  . metoprolol tartrate (LOPRESSOR) 50 MG tablet Take 1 tablet (50 mg total) by mouth 2 (two) times daily. 180 tablet 2  . pravastatin (PRAVACHOL) 40 MG tablet Take 1 tablet (40 mg total) by mouth daily. 90 tablet 0   No current facility-administered medications on file prior to visit.     Allergies  Allergen Reactions  . Dexbromphen-Acetaminophen  Other (See Comments)    Tachycardia, and leg numbness Tachycardia, and leg numbness   . Fish Oil     REACTION: rash REACTION: rash  . Codeine Other (See Comments)    Mental status changes Mental status changes   . Bisoprolol-Hydrochlorothiazide Other (See Comments)    Dropped potassium level,hypokalemia Dropped potassium level,hypokalemia   . Crestor [Rosuvastatin]     Elevated LFTs  . Ezetimibe-Simvastatin Other (See Comments)    Elevated LFTs Makes liver enzymes too high Elevated LFTs Makes liver enzymes too high   . Hydrochlorothiazide     REACTION: low potassium level REACTION: low potassium level  . Other Palpitations    dryx oil dryx oil     Assessment/Plan:  1. Hyperlipidemia - Patient's LDL is above goal of 100mg /dL, however  most recent lipid panel results pt only taking Zetia. Since then, she has been started on pravastatin 40mg  daily and has been tolerating therapy well. Will continue pravastatin 40mg  daily and ezetimibe 10mg  daily as combination therapy should bring LDL to goal. Recheck lipid panel and LFTs in September at PCP as planned. If LDL remains above goal, may be able to consider PCSK9i therapy however coverage for primary prevention is still limited. Counseled pt to decrease fats and red meats in diet. Increase exercise a few minutes each day, working up to 20 minutes daily. Follow up with lipid clinic as needed.    Isaias Sakai, Sherian Rein D PGY1 Pharmacy Resident  Phone [phone] 03/25/2018      9:16 AM

## 2018-04-14 ENCOUNTER — Ambulatory Visit: Payer: Medicare Other | Admitting: Nurse Practitioner

## 2018-04-21 ENCOUNTER — Ambulatory Visit: Payer: Medicare Other | Admitting: Nurse Practitioner

## 2018-04-21 ENCOUNTER — Other Ambulatory Visit (INDEPENDENT_AMBULATORY_CARE_PROVIDER_SITE_OTHER): Payer: Medicare Other

## 2018-04-21 ENCOUNTER — Encounter: Payer: Self-pay | Admitting: Nurse Practitioner

## 2018-04-21 VITALS — BP 150/90 | HR 54 | Ht 64.0 in | Wt 158.0 lb

## 2018-04-21 DIAGNOSIS — E119 Type 2 diabetes mellitus without complications: Secondary | ICD-10-CM

## 2018-04-21 DIAGNOSIS — I10 Essential (primary) hypertension: Secondary | ICD-10-CM

## 2018-04-21 DIAGNOSIS — E782 Mixed hyperlipidemia: Secondary | ICD-10-CM

## 2018-04-21 LAB — LIPID PANEL
CHOL/HDL RATIO: 3
CHOLESTEROL: 127 mg/dL (ref 0–200)
HDL: 44.9 mg/dL (ref >39.00)
LDL CALC: 60 mg/dL (ref 0–99)
NonHDL: 81.95
TRIGLYCERIDES: 110 mg/dL (ref 0.0–149.0)
VLDL: 22 mg/dL (ref 0.0–40.0)

## 2018-04-21 LAB — COMPREHENSIVE METABOLIC PANEL
ALBUMIN: 3.7 g/dL (ref 3.5–5.2)
ALT: 29 U/L (ref 0–35)
AST: 41 U/L — AB (ref 0–37)
Alkaline Phosphatase: 77 U/L (ref 39–117)
BUN: 11 mg/dL (ref 6–23)
CALCIUM: 10.5 mg/dL (ref 8.4–10.5)
CHLORIDE: 106 meq/L (ref 96–112)
CO2: 28 mEq/L (ref 19–32)
Creatinine, Ser: 0.77 mg/dL (ref 0.40–1.20)
GFR: 78.61 mL/min (ref >60.00)
Glucose, Bld: 126 mg/dL — ABNORMAL HIGH (ref 70–99)
POTASSIUM: 4.6 meq/L (ref 3.5–5.1)
Sodium: 139 mEq/L (ref 135–145)
Total Bilirubin: 1.3 mg/dL — ABNORMAL HIGH (ref 0.2–1.2)
Total Protein: 7.1 g/dL (ref 6.0–8.3)

## 2018-04-21 LAB — HEMOGLOBIN A1C: HEMOGLOBIN A1C: 6.8 % — AB (ref 4.6–6.5)

## 2018-04-21 MED ORDER — PRAVASTATIN SODIUM 40 MG PO TABS: 40.0000 mg | ORAL_TABLET | Freq: Every day | ORAL | 0 refills | Status: DC

## 2018-04-21 NOTE — Assessment & Plan Note (Signed)
Continue zetia, pravastatin Update labs-she is fasting F/U with further recommendations pending lab results- will refer her back to lipid clinic if LDL remains >100 per last lipid clinic note - Comprehensive metabolic panel; Future - pravastatin (PRAVACHOL) 40 MG tablet; Take 1 tablet (40 mg total) by mouth daily.  Dispense: 90 tablet; Refill: 0 - Lipid panel; Future

## 2018-04-21 NOTE — Progress Notes (Signed)
Name: Courtney Russell   MRN: [id number]    DOB: 03/03/1946   Date:04/21/2018       Progress Note  Subjective  Chief Complaint Follow up  HPI  Since our last OV on 02/24/18, she has established care with cardiology lipid clinic, was instructed to continue her zetia 10 and pravastatin 40 daily and have repeat CMET, Lipid panel in September with PCP office, they will see her back as needed if her LDL is still too high . We will repeat cmet, lipid panel today and follow up on her HTN, DM as well as elevated calcium noted on recent labs, tells me she has been taking tums occassionally but otherwise no daily calcium supplement.  Hypertension -maintained on lisinopril 20, clonidine 0.1 BID, metoprolol 50 BID. Reports daily medication compliance without noted adverse medication effects. Reports continued normal readings at home, 120s-130s/60s-70s since her last OV. Denies weakness, headaches, vision changes, chest pain, shortness of breath, edema.  BP Readings from Last 3 Encounters:  04/21/18 (!) 150/90  03/10/18 (!) 160/95  02/24/18 (!) 142/90   Diabetes- maintained off medications due to her request after A1c noted to be 6.9 in June. She wanted to work on diet and exercise prior to new medication  Lab Results  Component Value Date   HGBA1C 6.9 (H) 01/16/2018      Patient Active Problem List   Diagnosis Date Noted  . Vitamin D deficiency 10/01/2016  . Routine general medical examination at a health care facility 09/27/2015  . Encounter for general adult medical examination with abnormal findings 09/27/2015  . Urinary urgency 09/27/2015  . Hypercalcemia 09/22/2013  . Nonspecific abnormal electrocardiogram (ECG) (EKG) 09/18/2012  . Irritable bowel disease 11/22/2010  . NIPPLE DISCHARGE 08/29/2010  . ANXIETY STATE, UNSPECIFIED 04/26/2010  . GERD 04/26/2010  . Type 2 diabetes mellitus ([place]) 04/21/2009  . POSTMENOPAUSAL SYNDROME 04/21/2009  . NONSPEC ELEVATION OF LEVELS OF  TRANSAMINASE/LDH 04/21/2009  . Hyperlipidemia 03/24/2007  . Essential hypertension 03/24/2007    Past Surgical History:  Procedure Laterality Date  . ABDOMINAL HYSTERECTOMY  1992   fibroids  . BILATERAL SALPINGOOPHORECTOMY  1996   cysts  . BREAST SURGERY     duct excision  . CHOLECYSTECTOMY    . COLONOSCOPY  1998, 2008   negative;Dr Olevia Perches  . HERNIA REPAIR    . TONSILLECTOMY    . TUBAL LIGATION      Family History  Problem Relation Age of Onset  . Cancer Father        COLON & PROSTATE  . Diabetes Father   . Alzheimer's disease Father   . Heart attack Father        MI in late 45s; CABG @ 52  . Stroke Father        in 90s  . Dementia Mother   . Transient ischemic attack Mother        TIAs; AVM leaked in 68s  . Colon polyps Mother   . Coronary artery disease Sister        stent @ 87  . Breast cancer Sister   . Diabetes Paternal Aunt     Social History   Socioeconomic History  . Marital status: Married    Spouse name: Not on file  . Number of children: 3  . Years of education: 79  . Highest education level: Not on file  Occupational History  . Occupation: Retired  Scientific laboratory technician  . Financial resource strain: Not on file  . Food insecurity:  Worry: Not on file    Inability: Not on file  . Transportation needs:    Medical: Not on file    Non-medical: Not on file  Tobacco Use  . Smoking status: Never Smoker  . Smokeless tobacco: Never Used  Substance and Sexual Activity  . Alcohol use: No  . Drug use: No  . Sexual activity: Not on file  Lifestyle  . Physical activity:    Days per week: Not on file    Minutes per session: Not on file  . Stress: Not on file  Relationships  . Social connections:    Talks on phone: Not on file    Gets together: Not on file    Attends religious service: Not on file    Active member of club or organization: Not on file    Attends meetings of clubs or organizations: Not on file    Relationship status: Not on file  .  Intimate partner violence:    Fear of current or ex partner: Not on file    Emotionally abused: Not on file    Physically abused: Not on file    Forced sexual activity: Not on file  Other Topics Concern  . Not on file  Social History Narrative   Fun: Travel and read   Denies abuse and feels safe at home.      Current Outpatient Medications:  .  aspirin 81 MG tablet, Take 81 mg by mouth 1 dose over 46 hours.  , Disp: , Rfl:  .  calcium carbonate (TUMS - DOSED IN MG ELEMENTAL CALCIUM) 500 MG chewable tablet, Chew 1 tablet by mouth daily., Disp: , Rfl:  .  cholecalciferol (VITAMIN D) 1000 units tablet, Vitamin D, Disp: , Rfl:  .  cloNIDine (CATAPRES) 0.1 MG tablet, Take 1 tablet (0.1 mg total) by mouth 2 (two) times daily. Take 1 tablet twice daily., Disp: 60 tablet, Rfl: 1 .  esomeprazole (NEXIUM) 40 MG capsule, Take 1 capsule (40 mg total) by mouth daily at 12 noon., Disp: 90 capsule, Rfl: 2 .  ezetimibe (ZETIA) 10 MG tablet, Take 1 tablet (10 mg total) by mouth daily., Disp: 90 tablet, Rfl: 3 .  lisinopril (PRINIVIL,ZESTRIL) 20 MG tablet, Take 1 tablet (20 mg total) by mouth daily., Disp: 30 tablet, Rfl: 1 .  metoprolol tartrate (LOPRESSOR) 50 MG tablet, Take 1 tablet (50 mg total) by mouth 2 (two) times daily., Disp: 180 tablet, Rfl: 2 .  pravastatin (PRAVACHOL) 40 MG tablet, Take 1 tablet (40 mg total) by mouth daily., Disp: 90 tablet, Rfl: 0  Allergies  Allergen Reactions  . Dexbromphen-Acetaminophen Other (See Comments)    Tachycardia, and leg numbness Tachycardia, and leg numbness   . Fish Oil     REACTION: rash REACTION: rash  . Codeine Other (See Comments)    Mental status changes Mental status changes   . Bisoprolol-Hydrochlorothiazide Other (See Comments)    Dropped potassium level,hypokalemia Dropped potassium level,hypokalemia   . Crestor [Rosuvastatin]     Elevated LFTs  . Ezetimibe-Simvastatin Other (See Comments)    Elevated LFTs Makes liver enzymes too  high Elevated LFTs Makes liver enzymes too high   . Hydrochlorothiazide     REACTION: low potassium level REACTION: low potassium level  . Other Palpitations    dryx oil dryx oil      ROS See HPI  Objective  Vitals:   04/21/18 0828  BP: (!) 150/90  Pulse: (!) 54  SpO2: 98%  Weight: 158  lb (71.7 kg)  Height: 5\' 4"  (1.626 m)    Body mass index is 27.12 kg/m.  Physical Exam Vital signs reviewed. Constitutional: Patient appears well-developed and well-nourished. No distress.  HENT: Head: Normocephalic and atraumatic. Nose: Nose normal. Mouth/Throat: Oropharynx is clear and moist. No oropharyngeal exudate.  Eyes: Conjunctivae and EOM are normal. Pupils are equal, round, and reactive to light. No scleral icterus.  Neck: Normal range of motion. Neck supple. Cardiovascular: Normal rate, regular rhythm and normal heart sounds. No murmur heard.Distal pulses intact Pulmonary/Chest: Effort normal and breath sounds normal. No respiratory distress. Neurological:She is alert and oriented to person, place, and time. No cranial nerve deficit. Coordination, balance, strength, speech and gait are normal.  Skin: Skin is warm and dry. No rash noted. No erythema.  Psychiatric: Patient has a normal mood and affect. behavior is normal. Judgment and thought content normal.   Assessment & Plan F/U TBD pending labs today  Hypercalcemia Update labs F/U with further recommendations pending lab results - Comprehensive metabolic panel; Future - PTH, Intact and Calcium; Future

## 2018-04-21 NOTE — Patient Instructions (Addendum)
Please head downstairs for lab work. If any of your test results are critically abnormal, you will be contacted right away. Otherwise, I will contact you within a week about your test results and follow up recommendations   We will decide when to follow up when I get your labs back.

## 2018-04-21 NOTE — Assessment & Plan Note (Signed)
Update A1c today F/U with further recommendations pending lab results - Hemoglobin A1c; Future

## 2018-04-21 NOTE — Assessment & Plan Note (Signed)
BP readings remain slightly elevated in clinic, normal at home Continue current medications Continue to monitor, F/u for home readings >140/90

## 2018-04-22 LAB — PTH, INTACT AND CALCIUM
CALCIUM: 10.9 mg/dL — AB (ref 8.6–10.4)
PTH: 148 pg/mL — ABNORMAL HIGH (ref 14–64)

## 2018-04-25 ENCOUNTER — Other Ambulatory Visit: Payer: Self-pay | Admitting: Nurse Practitioner

## 2018-04-25 DIAGNOSIS — E349 Endocrine disorder, unspecified: Secondary | ICD-10-CM

## 2018-04-25 NOTE — Progress Notes (Signed)
refer

## 2018-05-16 ENCOUNTER — Other Ambulatory Visit: Payer: Self-pay | Admitting: Nurse Practitioner

## 2018-05-16 DIAGNOSIS — I10 Essential (primary) hypertension: Secondary | ICD-10-CM

## 2018-05-29 ENCOUNTER — Ambulatory Visit: Payer: Self-pay | Admitting: *Deleted

## 2018-05-29 NOTE — Telephone Encounter (Addendum)
Best contact # - [phone] Side effects with Pravastatin  Reason for Disposition . Caller has NON-URGENT medication question about med that PCP prescribed and triager unable to answer question  Answer Assessment - Initial Assessment Questions 1. SYMPTOMS: "Do you have any symptoms?"     Patient has a history of heart palpitations she started the medication and her palpations got worse. 2. SEVERITY: If symptoms are present, ask "Are they mild, moderate or severe?"     Significantly worse- patient was feeling palpitations more consistently. Patient was pleased with the result of medication- but she has decided to stop medication. The palpitations have stopped. She wants to know if lowering the dosage would be helpful or changing to an alternative medication would be helpful.  Protocols used: MEDICATION QUESTION CALL-A-AH

## 2018-06-02 ENCOUNTER — Telehealth: Payer: Self-pay | Admitting: Nurse Practitioner

## 2018-06-02 NOTE — Telephone Encounter (Signed)
Pt informed of below.  

## 2018-06-02 NOTE — Addendum Note (Signed)
Addended by: Lance Sell on: 06/02/2018 11:42 AM   Modules accepted: Orders

## 2018-06-02 NOTE — Telephone Encounter (Signed)
It sounds like she is not going to be able to take any statins Lets have her stop the pravastatin altogether, continue her zetia. We will plan to recheck her cholesterol at her next follow up around March to determine what to do next

## 2018-06-02 NOTE — Telephone Encounter (Signed)
See 06/02/18 tele encounter.

## 2018-06-04 NOTE — Progress Notes (Signed)
Name: Courtney Russell  MRN/ DOB: [id number], Dec 07, 1945    Age/ Sex: 72 y.o., female    PCP: Lance Sell, NP   Reason for Endocrinology Evaluation: Hypercalcemia      Date of Initial Endocrinology Evaluation: 06/05/2018     HPI: Ms. Courtney Russell is Courtney Russell 72 y.o. female with Courtney Russell past medical history of HTN and Dyslipidemia . The patient presented for initial endocrinology clinic visit on 06/05/2018 for consultative assistance with her Hypercalcemia.   Courtney Russell indicates that she was first diagnosed with hypercalcemia ~ 10 yrs ago . Since that time, she has not experienced symptoms of constipation, polyuria, polydipsia, generalized weakness, diffuse muscle pains, significant memory impairment. She  uses over the counter calcium (including supplements, Tums, Rolaids, or other calcium containing antacids), she also uses Vitamin D, and has been for the past 4 years.  She denies using lithium or HCTZ.   She denies history of kidney stones, kidney disease, liver disease, granulomatous disease. She denies osteoporosis or prior fractures. Daily dietary calcium intake: 1 serving. She denies family history of osteoporosis, parathyroid disease, thyroid disease.   Both parents with Hx of kidney stones  In review of her records she has had intermittent hypercalcemia since 2015 with Courtney Russell serum calcium of 10.7 mg/dL (corrected 10.86)  She is scheduled to have DXA scan at [facility] mammography next month.   HISTORY:  Past Medical History:  Past Medical History:  Diagnosis Date  . GERD (gastroesophageal reflux disease)   . High cholesterol   . Hypertension   . IBS (irritable bowel syndrome)   . Palpitations   . Prediabetes    Past Surgical History:  Past Surgical History:  Procedure Laterality Date  . ABDOMINAL HYSTERECTOMY  1992   fibroids  . BILATERAL SALPINGOOPHORECTOMY  1996   cysts  . BREAST SURGERY     duct excision  . CHOLECYSTECTOMY    . COLONOSCOPY  1998, 2008   negative;Dr Olevia Perches  . HERNIA REPAIR    . TONSILLECTOMY    . TUBAL LIGATION        Social History:  reports that she has never smoked. She has never used smokeless tobacco. She reports that she does not drink alcohol or use drugs.  Family History: family history includes Alzheimer's disease in her father; Breast cancer in her sister; Cancer in her father; Colon polyps in her mother; Coronary artery disease in her sister; Dementia in her mother; Diabetes in her father and paternal aunt; Heart attack in her father; Stroke in her father; Transient ischemic attack in her mother.   HOME MEDICATIONS: Current Outpatient Medications on File Prior to Visit  Medication Sig Dispense Refill  . aspirin 81 MG tablet Take 81 mg by mouth 1 dose over 46 hours.      . calcium carbonate (TUMS - DOSED IN MG ELEMENTAL CALCIUM) 500 MG chewable tablet Chew 1 tablet by mouth daily.    . cholecalciferol (VITAMIN D) 1000 units tablet Vitamin D    . cloNIDine (CATAPRES) 0.1 MG tablet Take 1 tablet (0.1 mg total) by mouth 2 (two) times daily. Take 1 tablet twice daily. 60 tablet 1  . esomeprazole (NEXIUM) 40 MG capsule Take 1 capsule (40 mg total) by mouth daily at 12 noon. 90 capsule 2  . ezetimibe (ZETIA) 10 MG tablet Take 1 tablet (10 mg total) by mouth daily. 90 tablet 3  . lisinopril (PRINIVIL,ZESTRIL) 20 MG tablet TAKE 1 TABLET BY MOUTH ONCE DAILY 90 tablet  0  . metoprolol tartrate (LOPRESSOR) 50 MG tablet Take 1 tablet (50 mg total) by mouth 2 (two) times daily. 180 tablet 2   No current facility-administered medications on file prior to visit.       REVIEW OF SYSTEMS: Courtney Russell comprehensive ROS was conducted with the patient and is negative except as per HPI and below:  ROS     OBJECTIVE:  VS: BP (!) 150/90   Pulse (!) 54   Resp 14   Ht '5\' 4"'  (1.626 m)   Wt 158 lb (71.7 kg)   SpO2 97%   BMI 27.12 kg/m    Wt Readings from Last 3 Encounters:  06/05/18 158 lb (71.7 kg)  04/21/18 158 lb (71.7 kg)    03/10/18 157 lb (71.2 kg)     EXAM: General: Pt appears well and is in NAD  Hydration: Well-hydrated with moist mucous membranes and good skin turgor  Eyes: External eye exam normal without stare, lid lag or exophthalmos.  EOM intact.  PERRL.  Ears, Nose, Throat: Hearing: Grossly intact bilaterally Dental: Good dentition  Throat: Clear without mass, erythema or exudate  Neck: General: Supple without adenopathy. Thyroid: Thyroid size normal.  No goiter or nodules appreciated. No thyroid bruit.  Lungs: Clear with good BS bilat with no rales, rhonchi, or wheezes  Heart: Auscultation: RRR.  Abdomen: Normoactive bowel sounds, soft, nontender, without masses or organomegaly palpable  Extremities:  BL LE: No pretibial edema normal ROM and strength.  Skin: Hair: Texture and amount normal with gender appropriate distribution Skin Inspection: No rashes, acanthosis nigricans/skin tags. Skin Palpation: Skin temperature, texture, and thickness normal to palpation  Neuro: Cranial nerves: II - XII grossly intact  Motor: Normal strength throughout DTRs: 2+ and symmetric in UE without delay in relaxation phase  Mental Status: Judgment, insight: Intact Orientation: Oriented to time, place, and person Mood and affect: No depression, anxiety, or agitation     DATA REVIEWED:  CMP     Component Value Date/Time   NA 139 04/21/2018 0915   K 4.6 04/21/2018 0915   CL 106 04/21/2018 0915   CO2 28 04/21/2018 0915   GLUCOSE 126 (H) 04/21/2018 0915   BUN 11 04/21/2018 0915   CREATININE 0.77 04/21/2018 0915   CREATININE 1.10 03/10/2018 1035   CALCIUM 10.9 (H) 04/21/2018 0917   PROT 7.1 04/21/2018 0915   ALBUMIN 3.7 04/21/2018 0915   AST 41 (H) 04/21/2018 0915   AST 54 (H) 03/10/2018 1035   ALT 29 04/21/2018 0915   ALT 44 03/10/2018 1035   ALKPHOS 77 04/21/2018 0915   BILITOT 1.3 (H) 04/21/2018 0915   BILITOT 1.5 03/10/2018 1035   GFRNONAA >60 10/24/2010 1448   GFRAA  10/24/2010 1448    >60         The eGFR has been calculated using the MDRD equation. This calculation has not been validated in all clinical situations. eGFR's persistently <60 mL/min signify possible Chronic Kidney Disease.   Results for Courtney Russell, Courtney Russell (MRN [id number]) as of 06/05/2018 10:42  Ref. Range 04/21/2018 09:17  PTH, Intact Latest Ref Range: 14 - 64 pg/mL 148 (H)  Results for Courtney Russell, Courtney "Courtney Russell" (MRN [id number]) as of 06/05/2018 10:42  Ref. Range 01/16/2018 08:57  TSH Latest Ref Range: 0.35 - 4.50 uIU/mL 0.59  Results for Courtney Russell, Courtney Russell" (MRN [id number]) as of 06/06/2018 14:03  Ref. Range 06/05/2018 11:33  Calcium Latest Ref Range: 8.6 - 10.4 mg/dL 10.4  Albumin  Latest Ref Range: 3.5 - 5.2 g/dL 3.9  Results for Courtney Russell, Courtney Russell" (MRN [id number]) as of 06/06/2018 14:03  Ref. Range 06/05/2018 11:33  VITD Latest Ref Range: 30.00 - 100.00 ng/mL 36.63       Results for Courtney Russell, Courtney "Courtney Russell" (MRN [id number]) as of 06/06/2018 14:03  Ref. Range 06/05/2018 11:33  PTH, Intact Latest Ref Range: 14 - 64 pg/mL 154 (H)    ASSESSMENT/PLAN/RECOMMENDATIONS:   1. PTH- Mediated Hypercalcemia :   - We discussed D/D of primary hyperparathyroidism vs Familial hypocalciuric hypercalcemia .  - We discussed that if she has [place], we will not need to pursue any further evaluation as [place] has Courtney Russell benign course. - If the patient has primary hyperparathyroidism, she will  need further evaluation to determine surgical candidacy - Criteria for surgical intervention (Serum calcium concentration of 1.0 mg/dL or more above the upper limit of normal,Estimated glomerular filtration rate (eGFR) <60 mL/min, evidence of Osteoporosis on bone density, Twenty-four-hour urinary calcium >300 mg/day ,Nephrolithiasis or nephrocalcinosis by radiograph, Age less than 50 years.) - Encouraged hydration  - AVOID CALCIUM SUPPLEMENTS, AVOID LOW CALCIUM DIET - Maintain normal  dietary calcium intake (2-3 servings of dairy Courtney Russell day) - DXA to rule out Osteoporosis - This is scheduled at [facility] , will ask PCP to include distal 1/3rd of the forearm  -  Will hold off on the Renal ultrasound to r/o nephrolithiasis or nephrocalcinosis at this time.  - Once her vitamin D is replenished and she has been on normal calcium intake diet, will proceed with 24-hr urine collection for calcium.   2. HTN:  - This is out of control in the office . Repeat checking confirmed BP trending down. Pt checks BP at home and states her BP is < 140/85 mmhg at home.  - She is asymptomatic at this time.    F/u in 3 months  Addendum: Discussed lab results with pt on 06/06/18 @ 1400 which are stable. Pt advised to proceed with 24-hr urine collection.   Signed electronically by: Mack Guise, MD  [facility]  [facility] [address] [place], [place] [address] Phone: [phone] FAX: [phone]   CC: Lance Sell, NP [place] [address] Phone: [phone] Fax: [phone]   Return to Endocrinology clinic as below: Future Appointments  Date Time Provider [place]  07/16/2018  8:45 AM [facility] None  07/16/2018  9:15 AM [place], Holli Humbles, NP [facility] None  10/30/2018  8:30 AM Lance Sell, NP [facility]

## 2018-06-05 ENCOUNTER — Ambulatory Visit: Payer: Medicare Other | Admitting: Internal Medicine

## 2018-06-05 ENCOUNTER — Encounter: Payer: Self-pay | Admitting: Internal Medicine

## 2018-06-05 VITALS — BP 150/90 | HR 54 | Resp 14 | Ht 64.0 in | Wt 158.0 lb

## 2018-06-05 DIAGNOSIS — E213 Hyperparathyroidism, unspecified: Secondary | ICD-10-CM

## 2018-06-05 LAB — VITAMIN D 25 HYDROXY (VIT D DEFICIENCY, FRACTURES): VITD: 36.63 ng/mL (ref 30.00–100.00)

## 2018-06-05 LAB — ALBUMIN: Albumin: 3.9 g/dL (ref 3.5–5.2)

## 2018-06-05 NOTE — Patient Instructions (Addendum)
-   Avoid Over the counter calcium tablets including tums - Stay Hydrated daily - Continue Vitamin D 1000 units daily - Consume 2-3 servings of calcium a day  FOODS THAT CONTAIN CALCIUM  Calcium can be found in many foods, not only in dairy products.  Dairy Foods  Yogurt (1 cup) 350 mg  Milk (1 cup) 300 mg  Cheddar cheese (1 oz.) 204 mg  Ricotta cheese, part skim (1/4 cup) 169 mg  Cottage cheese (1 cup) 150 mg  Nondairy Foods  Whole Grain Total cereal (3/4 cup) 1000 mg  Pink salmon with bones, sardines (3 oz., cooked) 181 mg  Black beans (1 cup) 103 mg  Broccoli (1 cup, cooked) 150 mg  Almonds (1 tbsp.) 50 mg  Soy Products  Soy yogurt with calcium (3/4 cup) 300 mg  Soy milk enriched with calcium (1 cup) 300 mg  Tofu, firm or extra firm (1/4 cup) 250 mg  Soy nuts, roasted/salted (1/2 cup) 103 mg    24-Hour Urine Collection   You will be collecting your urine for a 24-hour period of time.  Your timer starts with your first urine of the morning (For example - If you first pee at [place], your timer will start at [place])  [place] away your first urine of the morning  Collect your urine every time you pee for the next 24 hours STOP your urine collection 24 hours after you started the collection (For example - You would stop at 9AM the day after you started)

## 2018-06-06 ENCOUNTER — Telehealth: Payer: Self-pay | Admitting: Nurse Practitioner

## 2018-06-06 LAB — PTH, INTACT AND CALCIUM
Calcium: 10.4 mg/dL (ref 8.6–10.4)
PTH: 154 pg/mL — AB (ref 14–64)

## 2018-06-06 NOTE — Telephone Encounter (Signed)
Per [facility] xray dept we can add "please scan distal 1/3 forearm" to the comments in DEXA order. Order updated per PCP.

## 2018-06-06 NOTE — Telephone Encounter (Signed)
-----   Message from Cloyd Stagers, MD sent at 06/05/2018 12:49 PM EST ----- Hi Ms. Shambley,   Thank you so much for referring Courtney Russell to our office for hypercalcemia.   She told me you have scheduled her for DXA next month, and I am contacting you to see if you can add for the team to scan her distal 1/3 forearm ?  People with Primary Hyperparathyroidism, its important to check the distal one third of radius on DXA .  I am not sure if she has Primary Hyperpara yet or she may have Familial hypocalciuric hypercalcemia , but since medicare will only cover for DXA once every 2 yrs, I would like for her to have that included,  just in case she has pHPT .  Thanks a lot for your help.  Abby     Abby Nena Jordan, MD  [facility] Endocrinology  [facility] [place] [place], [place] [address] Phone: [phone] FAX: [phone]

## 2018-06-06 NOTE — Telephone Encounter (Signed)
Can you see if you can find out from the DEXA tech how to add the distal 1/3 forearm to her DEXA scan order please?

## 2018-06-06 NOTE — Addendum Note (Signed)
Addended by: Lance Sell on: 06/06/2018 08:34 AM   Modules accepted: Orders

## 2018-06-11 ENCOUNTER — Other Ambulatory Visit: Payer: Medicare Other

## 2018-06-11 DIAGNOSIS — E213 Hyperparathyroidism, unspecified: Secondary | ICD-10-CM

## 2018-06-12 LAB — CALCIUM, URINE, 24 HOUR
Calcium, 24H Urine: 249.2 mg/24 hr (ref 100.0–300.0)
Calcium, Urine: 17.8 mg/dL

## 2018-06-12 LAB — CREATININE, URINE, 24 HOUR
CREATININE 24H UR: 616 mg/(24.h) — AB (ref 800–1800)
Creatinine, Urine: 44 mg/dL

## 2018-06-13 ENCOUNTER — Telehealth: Payer: Self-pay | Admitting: Internal Medicine

## 2018-06-13 NOTE — Telephone Encounter (Signed)
Discussed 24-hr urine collection with pt,  Somehow her creatinine is low for her weight but she assured me that she collected from 10 am to 10 am the next day Volume was 1.4 L  Ca-Cr clearance ration was 0.029 consistent with Primary hyperparathyroidism   Awaiting DXA results.     Melanie Crazier Shamleffer

## 2018-07-09 LAB — HM DEXA SCAN

## 2018-07-10 ENCOUNTER — Other Ambulatory Visit: Payer: Medicare Other

## 2018-07-10 ENCOUNTER — Ambulatory Visit: Payer: Medicare Other | Admitting: Family

## 2018-07-15 ENCOUNTER — Other Ambulatory Visit: Payer: Self-pay | Admitting: Family

## 2018-07-15 ENCOUNTER — Other Ambulatory Visit: Payer: Self-pay | Admitting: Nurse Practitioner

## 2018-07-15 DIAGNOSIS — D582 Other hemoglobinopathies: Secondary | ICD-10-CM

## 2018-07-15 DIAGNOSIS — I10 Essential (primary) hypertension: Secondary | ICD-10-CM

## 2018-07-15 DIAGNOSIS — R7989 Other specified abnormal findings of blood chemistry: Principal | ICD-10-CM

## 2018-07-15 DIAGNOSIS — R718 Other abnormality of red blood cells: Principal | ICD-10-CM

## 2018-07-16 ENCOUNTER — Inpatient Hospital Stay: Payer: Medicare Other | Attending: Hematology & Oncology

## 2018-07-16 ENCOUNTER — Encounter: Payer: Self-pay | Admitting: Family

## 2018-07-16 ENCOUNTER — Inpatient Hospital Stay (HOSPITAL_BASED_OUTPATIENT_CLINIC_OR_DEPARTMENT_OTHER): Payer: Medicare Other | Admitting: Family

## 2018-07-16 ENCOUNTER — Other Ambulatory Visit: Payer: Self-pay

## 2018-07-16 VITALS — BP 144/80 | HR 59 | Temp 97.9°F | Resp 20 | Wt 158.0 lb

## 2018-07-16 DIAGNOSIS — R7989 Other specified abnormal findings of blood chemistry: Secondary | ICD-10-CM

## 2018-07-16 DIAGNOSIS — R718 Other abnormality of red blood cells: Secondary | ICD-10-CM

## 2018-07-16 DIAGNOSIS — D582 Other hemoglobinopathies: Secondary | ICD-10-CM

## 2018-07-16 DIAGNOSIS — Z7982 Long term (current) use of aspirin: Secondary | ICD-10-CM | POA: Diagnosis not present

## 2018-07-16 DIAGNOSIS — Z7902 Long term (current) use of antithrombotics/antiplatelets: Secondary | ICD-10-CM

## 2018-07-16 DIAGNOSIS — Z79899 Other long term (current) drug therapy: Secondary | ICD-10-CM | POA: Diagnosis not present

## 2018-07-16 LAB — CBC WITH DIFFERENTIAL (CANCER CENTER ONLY)
Abs Immature Granulocytes: 0.02 10*3/uL (ref 0.00–0.07)
Basophils Absolute: 0.1 10*3/uL (ref 0.0–0.1)
Basophils Relative: 1 %
Eosinophils Absolute: 0.2 10*3/uL (ref 0.0–0.5)
Eosinophils Relative: 3 %
HCT: 45.1 % (ref 36.0–46.0)
Hemoglobin: 14.5 g/dL (ref 12.0–15.0)
Immature Granulocytes: 0 %
LYMPHS PCT: 39 %
Lymphs Abs: 2.8 10*3/uL (ref 0.7–4.0)
MCH: 29.7 pg (ref 26.0–34.0)
MCHC: 32.2 g/dL (ref 30.0–36.0)
MCV: 92.2 fL (ref 80.0–100.0)
Monocytes Absolute: 0.6 10*3/uL (ref 0.1–1.0)
Monocytes Relative: 8 %
Neutro Abs: 3.6 10*3/uL (ref 1.7–7.7)
Neutrophils Relative %: 49 %
Platelet Count: 288 10*3/uL (ref 150–400)
RBC: 4.89 MIL/uL (ref 3.87–5.11)
RDW: 13.2 % (ref 11.5–15.5)
WBC: 7.3 10*3/uL (ref 4.0–10.5)
nRBC: 0 % (ref 0.0–0.2)

## 2018-07-16 LAB — CMP (CANCER CENTER ONLY)
ALBUMIN: 3.8 g/dL (ref 3.5–5.0)
ALK PHOS: 86 U/L (ref 38–126)
ALT: 28 U/L (ref 0–44)
AST: 32 U/L (ref 15–41)
Anion gap: 7 (ref 5–15)
BUN: 13 mg/dL (ref 8–23)
CALCIUM: 10.1 mg/dL (ref 8.9–10.3)
CO2: 25 mmol/L (ref 22–32)
CREATININE: 0.85 mg/dL (ref 0.44–1.00)
Chloride: 107 mmol/L (ref 98–111)
GFR, Est AFR Am: >60 mL/min (ref >60)
GFR, Estimated: >60 mL/min (ref >60)
GLUCOSE: 159 mg/dL — AB (ref 70–99)
Potassium: 4.3 mmol/L (ref 3.5–5.1)
SODIUM: 139 mmol/L (ref 135–145)
Total Bilirubin: 1.3 mg/dL — ABNORMAL HIGH (ref 0.3–1.2)
Total Protein: 6.4 g/dL — ABNORMAL LOW (ref 6.5–8.1)

## 2018-07-16 LAB — IRON AND TIBC
IRON: 127 ug/dL (ref 41–142)
Saturation Ratios: 41 % (ref 21–57)
TIBC: 313 ug/dL (ref 236–444)
UIBC: 186 ug/dL (ref 120–384)

## 2018-07-16 LAB — FERRITIN: Ferritin: 261 ng/mL (ref 11–307)

## 2018-07-16 NOTE — Progress Notes (Signed)
Hematology and Oncology Follow Up Visit  Courtney Russell [id number] 09/21/1945 72 y.o. 07/16/2018   Principle Diagnosis:  Elevated ferritin - reactive   Current Therapy:   Observation   Interim History:  Courtney Russell is is here today for follow-up. She is doing well and has no complaints at this time.  Her Hemochromatosis DNA was negative. Her iron studies were stable with iron saturation of 35% and ferritin 487.  She states that she has arthritis in her knees that comes and goes.  No episodes of bleeding, no bruising or petechiae.  No fever, chills, n/v, cough, rash, dizziness, SOB, chest pain, palpitations, abdominal pain or changes in bowel or bladder habits.  No swelling, tenderness, numbness or tingling in her extremities at this time.  No lymphadenopathy noted.  She has maintained a good appetite and is staying well hydrated. Her weight is stable.   ECOG Performance Status: 1 - Symptomatic but completely ambulatory  Medications:  Allergies as of 07/16/2018      Reactions   Dexbromphen-acetaminophen Other (See Comments)   Tachycardia, and leg numbness Tachycardia, and leg numbness   Fish Oil    REACTION: rash REACTION: rash   Codeine Other (See Comments)   Mental status changes Mental status changes   Bisoprolol-hydrochlorothiazide Other (See Comments)   Dropped potassium level,hypokalemia Dropped potassium level,hypokalemia   Crestor [rosuvastatin]    Elevated LFTs   Ezetimibe-simvastatin Other (See Comments)   Elevated LFTs Makes liver enzymes too high Elevated LFTs Makes liver enzymes too high   Pravastatin    palpitations    Hydrochlorothiazide    REACTION: low potassium level REACTION: low potassium level   Other Palpitations   dryx oil dryx oil      Medication List       Accurate as of July 16, 2018  9:11 AM. Always use your most recent med list.        aspirin 81 MG tablet Take 81 mg by mouth 1 dose over 46 hours.   calcium carbonate  500 MG chewable tablet Commonly known as:  TUMS - dosed in mg elemental calcium Chew 1 tablet by mouth daily.   cholecalciferol 1000 units tablet Commonly known as:  VITAMIN D Vitamin D   cloNIDine 0.1 MG tablet Commonly known as:  CATAPRES Take 1 tablet (0.1 mg total) by mouth 2 (two) times daily. Take 1 tablet twice daily.   esomeprazole 40 MG capsule Commonly known as:  NEXIUM Take 1 capsule (40 mg total) by mouth daily at 12 noon.   ezetimibe 10 MG tablet Commonly known as:  ZETIA Take 1 tablet (10 mg total) by mouth daily.   lisinopril 20 MG tablet Commonly known as:  PRINIVIL,ZESTRIL TAKE 1 TABLET BY MOUTH ONCE DAILY   metoprolol tartrate 50 MG tablet Commonly known as:  LOPRESSOR TAKE 1 TABLET BY MOUTH TWICE DAILY       Allergies:  Allergies  Allergen Reactions  . Dexbromphen-Acetaminophen Other (See Comments)    Tachycardia, and leg numbness Tachycardia, and leg numbness   . Fish Oil     REACTION: rash REACTION: rash  . Codeine Other (See Comments)    Mental status changes Mental status changes   . Bisoprolol-Hydrochlorothiazide Other (See Comments)    Dropped potassium level,hypokalemia Dropped potassium level,hypokalemia   . Crestor [Rosuvastatin]     Elevated LFTs  . Ezetimibe-Simvastatin Other (See Comments)    Elevated LFTs Makes liver enzymes too high Elevated LFTs Makes liver enzymes too high   .  Pravastatin     palpitations    . Hydrochlorothiazide     REACTION: low potassium level REACTION: low potassium level  . Other Palpitations    dryx oil dryx oil     Past Medical History, Surgical history, Social history, and Family History were reviewed and updated.  Review of Systems: All other 10 point review of systems is negative.   Physical Exam:  vitals were not taken for this visit.   Wt Readings from Last 3 Encounters:  06/05/18 158 lb (71.7 kg)  04/21/18 158 lb (71.7 kg)  03/10/18 157 lb (71.2 kg)    Ocular: Sclerae  unicteric, pupils equal, round and reactive to light Ear-nose-throat: Oropharynx clear, dentition fair Lymphatic: No cervical, supraclavicular or axillary adenopathy Lungs no rales or rhonchi, good excursion bilaterally Heart regular rate and rhythm, no murmur appreciated Abd soft, nontender, positive bowel sounds, no liver or spleen tip palpated on exam, no fluid wave  MSK no focal spinal tenderness, no joint edema Neuro: non-focal, well-oriented, appropriate affect Breasts: Deferred   Lab Results  Component Value Date   WBC 7.3 07/16/2018   HGB 14.5 07/16/2018   HCT 45.1 07/16/2018   MCV 92.2 07/16/2018   PLT 288 07/16/2018   Lab Results  Component Value Date   FERRITIN 487 (H) 03/10/2018   IRON 110 03/10/2018   TIBC 313 03/10/2018   UIBC 203 03/10/2018   IRONPCTSAT 35 03/10/2018   Lab Results  Component Value Date   RBC 4.89 07/16/2018   No results found for: KPAFRELGTCHN, LAMBDASER, KAPLAMBRATIO No results found for: IGGSERUM, IGA, IGMSERUM No results found for: Odetta Pink, SPEI   Chemistry      Component Value Date/Time   NA 139 04/21/2018 0915   K 4.6 04/21/2018 0915   CL 106 04/21/2018 0915   CO2 28 04/21/2018 0915   BUN 11 04/21/2018 0915   CREATININE 0.77 04/21/2018 0915   CREATININE 1.10 03/10/2018 1035      Component Value Date/Time   CALCIUM 10.4 06/05/2018 1133   ALKPHOS 77 04/21/2018 0915   AST 41 (H) 04/21/2018 0915   AST 54 (H) 03/10/2018 1035   ALT 29 04/21/2018 0915   ALT 44 03/10/2018 1035   BILITOT 1.3 (H) 04/21/2018 0915   BILITOT 1.5 03/10/2018 1035       Impression and Plan: Courtney Russell is a very pleasant 72 yo caucasian female with history of a mildly elevated ferritin likely reactive. Her hemochromatosis DNA was negative and her counts have remained stable.  She has not complaints at this time and continues to do well.  At this point we will let her go from our office but we  will be happy to see her again for any future heme/onc issues.  She will contact our office with any questions or concerns.   Laverna Peace, NP 12/18/20199:11 AM

## 2018-07-17 ENCOUNTER — Encounter: Payer: Self-pay | Admitting: Nurse Practitioner

## 2018-07-21 ENCOUNTER — Telehealth: Payer: Self-pay | Admitting: Nurse Practitioner

## 2018-07-21 NOTE — Telephone Encounter (Signed)
Her dexa scan results show osteoporosis. I would recommend medication to treat your osteoporosis and prevent future bone fractures. Prolia is a good option - prolia is 1 shot given every 6 months here in the office. In addition, I recommend an adequate intake of dietary calcium (1200 mg/d) and vitamin D (800 IU daily)  and regular weight bearing exercise. Please let me know if you want to try prolia so I can give your information to our prolia nurse.

## 2018-07-22 NOTE — Telephone Encounter (Signed)
Pt contacted and informed of dexa results.   Forwarding to Jonelle Sidle to contact pt about Prolia. Pt stated that she is hypercalemic and endocrinology management of her calcium level.

## 2018-07-29 ENCOUNTER — Encounter: Payer: Self-pay | Admitting: Nurse Practitioner

## 2018-07-29 LAB — HM MAMMOGRAPHY

## 2018-08-04 ENCOUNTER — Telehealth: Payer: Self-pay | Admitting: Internal Medicine

## 2018-08-04 NOTE — Telephone Encounter (Signed)
Routing back to ashleigh---please advise if it's still ok to proceed with prolia---thanks

## 2018-08-04 NOTE — Telephone Encounter (Addendum)
   Received a message from PCP stating patient has osteoporosis on recent DXA. Distal 1/3 radius T score of -3.0    Given her diagnosis of primary hyperparathyroidism I would recommend proceeding with parathyroidectomy.    I left a message for the patient to call me back.     Abby Nena Jordan, MD  [facility] Endocrinology  [facility] [address] [place], [place] [address] Phone: [phone] FAX: [phone]

## 2018-08-11 NOTE — Telephone Encounter (Signed)
Per ashleigh---endocrinology needs to follow this patient with prolia, calcium levels----patient advised, she will keep follow up appt with endo and discuss with them

## 2018-08-11 NOTE — Telephone Encounter (Signed)
Patient is wanting to know if she is still going to proceed with Projlia inj Please [phone]

## 2018-08-14 ENCOUNTER — Other Ambulatory Visit: Payer: Self-pay | Admitting: Nurse Practitioner

## 2018-08-14 DIAGNOSIS — I10 Essential (primary) hypertension: Secondary | ICD-10-CM

## 2018-08-22 ENCOUNTER — Encounter: Payer: Self-pay | Admitting: Nurse Practitioner

## 2018-09-05 ENCOUNTER — Ambulatory Visit: Payer: Medicare Other | Admitting: Internal Medicine

## 2018-09-05 ENCOUNTER — Encounter: Payer: Self-pay | Admitting: Internal Medicine

## 2018-09-05 VITALS — BP 138/84 | HR 57 | Ht 64.0 in | Wt 159.2 lb

## 2018-09-05 DIAGNOSIS — E213 Hyperparathyroidism, unspecified: Secondary | ICD-10-CM | POA: Insufficient documentation

## 2018-09-05 DIAGNOSIS — M810 Age-related osteoporosis without current pathological fracture: Secondary | ICD-10-CM | POA: Diagnosis not present

## 2018-09-05 LAB — BASIC METABOLIC PANEL
BUN: 11 mg/dL (ref 6–23)
CO2: 26 mEq/L (ref 19–32)
Calcium: 10.6 mg/dL — ABNORMAL HIGH (ref 8.4–10.5)
Chloride: 107 mEq/L (ref 96–112)
Creatinine, Ser: 0.86 mg/dL (ref 0.40–1.20)
GFR: 65.03 mL/min (ref >60.00)
Glucose, Bld: 112 mg/dL — ABNORMAL HIGH (ref 70–99)
Potassium: 4.7 mEq/L (ref 3.5–5.1)
Sodium: 140 mEq/L (ref 135–145)

## 2018-09-05 LAB — ALBUMIN: Albumin: 3.8 g/dL (ref 3.5–5.2)

## 2018-09-05 MED ORDER — ALENDRONATE SODIUM 70 MG PO TABS: 70.0000 mg | ORAL_TABLET | ORAL | 11 refills | Status: DC

## 2018-09-05 NOTE — Patient Instructions (Signed)
-   Avoid Over the counter calcium tablets including tums - Stay Hydrated daily - Continue Vitamin D 1000 units daily - Consume 2-3 servings of calcium a day  FOODS THAT CONTAIN CALCIUM  Calcium can be found in many foods, not only in dairy products.  Dairy Foods  Yogurt (1 cup) 350 mg  Milk (1 cup) 300 mg  Cheddar cheese (1 oz.) 204 mg  Ricotta cheese, part skim (1/4 cup) 169 mg  Cottage cheese (1 cup) 150 mg  Nondairy Foods  Whole Grain Total cereal (3/4 cup) 1000 mg  Pink salmon with bones, sardines (3 oz., cooked) 181 mg  Black beans (1 cup) 103 mg  Broccoli (1 cup, cooked) 150 mg  Almonds (1 tbsp.) 50 mg  Soy Products  Soy yogurt with calcium (3/4 cup) 300 mg  Soy milk enriched with calcium (1 cup) 300 mg  Tofu, firm or extra firm (1/4 cup) 250 mg  Soy nuts, roasted/salted (1/2 cup) 103 mg

## 2018-09-05 NOTE — Progress Notes (Addendum)
Name: Courtney Russell  MRN/ DOB: [id number], [birth year]    Age/ Sex: 73 y.o., female     PCP: Lance Sell, NP   Reason for Endocrinology Evaluation:  Primary hyperparathyroidism     Initial Endocrinology Clinic Visit:  06/05/2018    PATIENT IDENTIFIER: Courtney Russell is a 73 y.o., female with a past medical history of hypertension and dyslipidemia. She has followed with [facility] since 06/05/2018 for consultative assistance with management of her hyperparathyroidism  HISTORICAL SUMMARY:  Courtney Russell has been diagnosed with hypercalcemia approximately in 2009, she has been asymptomatic all these years.In review of her records she has had intermittent hypercalcemia since 2015 with a serum calcium of 10.7 mg/dL (corrected 10.86) She denies any history of HCTZ or lithium use  No family history of hyperparathyroidism, but both parents have history of kidney stones    SUBJECTIVE:    Today (09/05/2018):  Courtney Russell is here for 71-month follow-up on hyperparathyroidism.  Since her last visit to our clinic she has submitted A 24-hour urine collection for calcium creatinine ratio of 0.029 which is consistent with primary hyperparathyroidism.  She has also had her DEXA since her last visit, with images consistent with osteoporosis, patient's had a distal one third radius T score of -3.0.  Today she denies any symptoms of polyuria polydipsia, no renal stones since her last visit, she denies any new onset constipation, she also denies any memory changes.   ROS:  As per HPI.   HISTORY:  Past Medical History:  Past Medical History:  Diagnosis Date  . GERD (gastroesophageal reflux disease)   . High cholesterol   . Hypertension   . IBS (irritable bowel syndrome)   . Palpitations   . Prediabetes     Past Surgical History:  Past Surgical History:  Procedure Laterality Date  . ABDOMINAL HYSTERECTOMY  1992   fibroids  . BILATERAL SALPINGOOPHORECTOMY   1996   cysts  . BREAST SURGERY     duct excision  . CHOLECYSTECTOMY    . COLONOSCOPY  1998, 2008   negative;Dr Olevia Perches  . HERNIA REPAIR    . TONSILLECTOMY    . TUBAL LIGATION       Social History:  reports that she has never smoked. She has never used smokeless tobacco. She reports that she does not drink alcohol or use drugs.  Family History: family history includes Alzheimer's disease in her father; Breast cancer in her sister; Cancer in her father; Colon polyps in her mother; Coronary artery disease in her sister; Dementia in her mother; Diabetes in her father and paternal aunt; Heart attack in her father; Stroke in her father; Transient ischemic attack in her mother.   HOME MEDICATIONS: Allergies as of 09/05/2018      Reactions   Dexbromphen-acetaminophen Other (See Comments)   Tachycardia, and leg numbness Tachycardia, and leg numbness   Fish Oil    REACTION: rash REACTION: rash   Codeine Other (See Comments)   Mental status changes Mental status changes   Bisoprolol-hydrochlorothiazide Other (See Comments)   Dropped potassium level,hypokalemia Dropped potassium level,hypokalemia   Crestor [rosuvastatin]    Elevated LFTs   Ezetimibe-simvastatin Other (See Comments)   Elevated LFTs Makes liver enzymes too high Elevated LFTs Makes liver enzymes too high   Pravastatin    palpitations    Hydrochlorothiazide    REACTION: low potassium level REACTION: low potassium level   Other Palpitations   dryx oil dryx oil  Medication List       Accurate as of September 05, 2018 10:16 AM. Always use your most recent med list.        aspirin 81 MG tablet Take 81 mg by mouth 1 dose over 46 hours.   calcium carbonate 500 MG chewable tablet Commonly known as:  TUMS - dosed in mg elemental calcium Chew 1 tablet by mouth daily.   cholecalciferol 1000 units tablet Commonly known as:  VITAMIN D Vitamin D   cloNIDine 0.1 MG tablet Commonly known as:  CATAPRES Take 1  tablet (0.1 mg total) by mouth 2 (two) times daily. Take 1 tablet twice daily.   esomeprazole 40 MG capsule Commonly known as:  NEXIUM Take 1 capsule (40 mg total) by mouth daily at 12 noon.   ezetimibe 10 MG tablet Commonly known as:  ZETIA Take 1 tablet (10 mg total) by mouth daily.   lisinopril 20 MG tablet Commonly known as:  PRINIVIL,ZESTRIL TAKE 1 TABLET BY MOUTH ONCE DAILY   metoprolol tartrate 50 MG tablet Commonly known as:  LOPRESSOR TAKE 1 TABLET BY MOUTH TWICE DAILY         OBJECTIVE:   PHYSICAL EXAM: VS: BP 138/84 (BP Location: Left Arm, Patient Position: Sitting, Cuff Size: Normal)   Pulse (!) 57   Ht 5\' 4"  (1.626 m)   Wt 159 lb 3.2 oz (72.2 kg)   SpO2 95%   BMI 27.33 kg/m    EXAM: General: Pt appears well and is in NAD  Hydration: Well-hydrated with moist mucous membranes and good skin turgor  Lungs: Clear with good BS bilat with no rales, rhonchi, or wheezes  Heart: Auscultation: RRR.  Abdomen: Normoactive bowel sounds, soft, nontender, without masses or organomegaly palpable  Extremities:  BL LE: No pretibial edema normal ROM and strength.  Skin: Hair: Texture and amount normal with gender appropriate distribution Skin Inspection: No rashes.  Skin Palpation: Skin temperature, texture, and thickness normal to palpation  Mental Status: Judgment, insight: Intact Memory: Intact for recent and remote events Mood and affect: No depression, anxiety, or agitation     DATA REVIEWED: Results for Courtney, Russell" (MRN [id number]) as of 09/07/2018 16:19  Ref. Range 09/05/2018 10:43  Sodium Latest Ref Range: 135 - 145 mEq/L 140  Potassium Latest Ref Range: 3.5 - 5.1 mEq/L 4.7  Chloride Latest Ref Range: 96 - 112 mEq/L 107  CO2 Latest Ref Range: 19 - 32 mEq/L 26  Glucose Latest Ref Range: 70 - 99 mg/dL 112 (H)  BUN Latest Ref Range: 6 - 23 mg/dL 11  Creatinine Latest Ref Range: 0.40 - 1.20 mg/dL 0.86  Calcium Latest Ref Range: 8.4 - 10.5  mg/dL 10.6 (H)  Albumin Latest Ref Range: 3.5 - 5.2 g/dL 3.8  GFR Latest Ref Range: >60.00 mL/min 65.03   PTH-pending   DXA 07/09/2018 HM Dexa Scan  T-score -3.0, Osteoporosis      ASSESSMENT / PLAN / RECOMMENDATIONS:   1. Hypercalcemia secondary to primary hyperparathyroidism:  -Patient is asymptomatic at this time -Based on her recent diagnosis of osteoporosis, she does qualify for surgical parathyroidectomy, patient declines surgical intervention at this point. - We have discussed long-term complications of hypercalcemia such as renal damage, worsening osteoporosis and increased risk of bone fractures as well as symptoms of hypocalcemia such as memory loss, constipation, abdominal pains -Despite understanding the above patient would like to wait at this time, we will proceed with antiresorptive therapy for osteoporosis, we will order a  renal ultrasound to rule out any renal stones or nephrocalcinosis.   Plan:  Repeat BMP , PTH and albumin today  Encourage hydration  Avoid over-the-counter calcium and any calcium containing products such as Tums    2. Osteoporosis :   - It is difficult to asceratin how much of this, is postmenopausal process vs primary hyperparathyroidism, at any point, having primary hyperparathyroidism does not help the process. Once parathyroidectomy is performed, it usually takes 6 months before any improvement is seen on DXA.   - Will start with Fosamax 70 mg once a week- we have discussed GI complications as well as the rare complication of osteonecrosis and atypical fracture, we have discussed drug holiday after 5 years of oral bisphosphonates, there is no urgent dental needs at this point  Patient advised that if she develops any heartburn symptoms to use antihistamines or PPIs, if symptoms are severe enough, will consider IV bisphosphonate.     Follow-up in 6 months   Signed electronically by: Mack Guise, MD  [facility]  Endocrinology  [facility] [address] [place], [place] [address] Phone: [phone] FAX: [phone]   CC: Lance Sell, NP [place] [address] Phone: [phone] Fax: [phone]

## 2018-09-08 ENCOUNTER — Telehealth: Payer: Self-pay | Admitting: Internal Medicine

## 2018-09-08 LAB — PTH, INTACT AND CALCIUM
Calcium: 10.7 mg/dL — ABNORMAL HIGH (ref 8.6–10.4)
PTH: 93 pg/mL — ABNORMAL HIGH (ref 14–64)

## 2018-09-08 NOTE — Telephone Encounter (Signed)
[facility] is returning a call from the office

## 2018-09-08 NOTE — Telephone Encounter (Signed)
Arena at [facility] is calling in regards to en epic order they received for the patient.   Please advise  [place]-  [phone]

## 2018-09-08 NOTE — Telephone Encounter (Signed)
Called and left voicemail requesting a call back for the person that left a message previously.

## 2018-09-09 ENCOUNTER — Telehealth: Payer: Self-pay | Admitting: Internal Medicine

## 2018-09-09 NOTE — Telephone Encounter (Signed)
Arena wanted Dr. Kelton Pillar to be aware that the ultrasound this pt had cannot rule out kidney stones. On the ultrasound, they do not look at the ureter or urethra. Also, Nephrocalcinosis is fine.

## 2018-09-09 NOTE — Telephone Encounter (Signed)
Closed duplicate encounter and added notes to this encounter as indicated below:  Arena from [facility] imaging is requesting a call back    PHONE- [phone]

## 2018-09-09 NOTE — Telephone Encounter (Signed)
Closed as duplicate 

## 2018-09-09 NOTE — Telephone Encounter (Signed)
Arena from [place] imaging is requesting a call back    PHONE- [phone]

## 2018-09-10 NOTE — Telephone Encounter (Signed)
Called Arena directly and let her know that we do want to proceed with the [place].

## 2018-09-10 NOTE — Telephone Encounter (Signed)
Duplicate entry closing   Please see note from 09/08/2018

## 2018-09-12 ENCOUNTER — Ambulatory Visit
Admission: RE | Admit: 2018-09-12 | Discharge: 2018-09-12 | Disposition: A | Discharge status: Home / Self Care | Payer: Medicare Other | Source: Ambulatory Visit | Attending: Internal Medicine | Admitting: Internal Medicine

## 2018-09-12 DIAGNOSIS — E213 Hyperparathyroidism, unspecified: Secondary | ICD-10-CM

## 2018-10-06 ENCOUNTER — Other Ambulatory Visit: Payer: Self-pay | Admitting: Nurse Practitioner

## 2018-10-06 DIAGNOSIS — I10 Essential (primary) hypertension: Secondary | ICD-10-CM

## 2018-10-20 ENCOUNTER — Telehealth: Payer: Self-pay | Admitting: Nurse Practitioner

## 2018-10-20 DIAGNOSIS — K219 Gastro-esophageal reflux disease without esophagitis: Secondary | ICD-10-CM

## 2018-10-20 DIAGNOSIS — E782 Mixed hyperlipidemia: Secondary | ICD-10-CM

## 2018-10-20 DIAGNOSIS — I10 Essential (primary) hypertension: Secondary | ICD-10-CM

## 2018-10-20 MED ORDER — CLONIDINE HCL 0.1 MG PO TABS: 0.1000 mg | ORAL_TABLET | Freq: Two times a day (BID) | ORAL | 0 refills | Status: DC

## 2018-10-20 MED ORDER — EZETIMIBE 10 MG PO TABS: 10.0000 mg | ORAL_TABLET | Freq: Every day | ORAL | 0 refills | Status: DC

## 2018-10-20 MED ORDER — ESOMEPRAZOLE MAGNESIUM 40 MG PO CPDR: 40.0000 mg | DELAYED_RELEASE_CAPSULE | Freq: Every day | ORAL | 0 refills | Status: DC

## 2018-10-20 MED ORDER — LISINOPRIL 20 MG PO TABS: 20.0000 mg | ORAL_TABLET | Freq: Every day | ORAL | 0 refills | Status: DC

## 2018-10-20 MED ORDER — METOPROLOL TARTRATE 50 MG PO TABS: 50.0000 mg | ORAL_TABLET | Freq: Two times a day (BID) | ORAL | 0 refills | Status: DC

## 2018-10-20 NOTE — Telephone Encounter (Signed)
Copied from [place] [id number]. Topic: Quick Communication - Rx Refill/Question >> Oct 20, 2018 10:27 AM [name] wrote: Medication: ezetimibe (ZETIA) 10 MG tablet - needs by mid June  cloNIDine (CATAPRES) 0.1 MG tablet - wont need until mid June but would like called in due to virus and [facility] leaving  metoprolol tartrate (LOPRESSOR) 50 MG tablet - wont need until mid June  esomeprazole (NEXIUM) 40 MG - needs by mid April  lisinopril (PRINIVIL,ZESTRIL) 20 MG tablet - Needs by mid April  lisinopril (PRINIVIL,ZESTRIL) 10 MG tablet - Needs by Mid April  Pt is only requesting early due to Covid 19 and [facility] leaving/ please advise   Has the patient contacted their pharmacy?no   Preferred Pharmacy (with phone number or street name): [place], [place] - [address] [phone] (Phone) [phone] (Fax)    Agent: Please be advised that RX refills may take up to 3 business days. We ask that you follow-up with your pharmacy.

## 2018-10-20 NOTE — Telephone Encounter (Signed)
Copied from [place] [id number]. Topic: Quick Communication - Rx Refill/Question >> Oct 20, 2018 10:27 AM [name] wrote: Medication: ezetimibe (ZETIA) 10 MG tablet - needs by mid June  cloNIDine (CATAPRES) 0.1 MG tablet - wont need until mid June but would like called in due to virus and [facility] leaving  metoprolol tartrate (LOPRESSOR) 50 MG tablet - wont need until mid June  esomeprazole (NEXIUM) 40 MG - needs by mid April  lisinopril (PRINIVIL,ZESTRIL) 20 MG tablet - Needs by mid April  lisinopril (PRINIVIL,ZESTRIL) 10 MG tablet - Needs by Mid April  Pt is only requesting early due to Covid 19 and [facility] leaving/ please advise   Has the patient contacted their pharmacy?no   Preferred Pharmacy (with phone number or street name): ***  Agent: Please be advised that RX refills may take up to 3 business days. We ask that you follow-up with your pharmacy.

## 2018-10-21 ENCOUNTER — Telehealth: Payer: Self-pay | Admitting: *Deleted

## 2018-10-21 NOTE — Telephone Encounter (Signed)
I called patient about her 10/22/18 OV with PCP due to COVID-19 pandemic. I advised patient per Caesar Chestnut, NP we will cancel 10/22/18 OV and advised her to call our office in 1 month to schedule a f/u for Lipids and A1c check. Patient agrees and verbalized understanding.   She is requesting refill for Lisinopril 10 mg to take in addition to her Lisinopril 20 mg, Clonidine and Metoprolol. Please advise- does she need both doses of Lisinopril?

## 2018-10-22 ENCOUNTER — Ambulatory Visit: Payer: Medicare Other | Admitting: Nurse Practitioner

## 2018-10-22 NOTE — Telephone Encounter (Signed)
Patient states at some point back in the fall it was discussed that she could take Lisinopril 10 mg with Lisinopril 20 mg to better control her hypertension. This is the dose she has been currently taking.

## 2018-10-22 NOTE — Telephone Encounter (Signed)
Can you call her to verify lisinopril dosage, at her last visit with me she was on 20mg  daily

## 2018-10-23 NOTE — Telephone Encounter (Signed)
Pt called to follow up on refill for Lisinopril 10mg . She stated she has her medication bottle dated 05/02/18 for Lisinopril 10mg  if that would help. Please advise. [phone]

## 2018-10-24 MED ORDER — LISINOPRIL 10 MG PO TABS: 30.0000 mg | ORAL_TABLET | Freq: Every day | ORAL | 3 refills | Status: DC

## 2018-10-24 NOTE — Telephone Encounter (Signed)
I have sent a refill of lisinopril 10 to her pharmacy It may be easier to have her take 3 of the 10 mg tablets daily, so she does not have to keep picking up 2 different dosages going forward.

## 2018-10-24 NOTE — Telephone Encounter (Signed)
Pt informed of below.  

## 2018-10-30 ENCOUNTER — Ambulatory Visit: Payer: Medicare Other | Admitting: Nurse Practitioner

## 2018-11-24 ENCOUNTER — Telehealth: Payer: Self-pay | Admitting: *Deleted

## 2018-11-24 NOTE — Telephone Encounter (Signed)
Left mess for patient to call back to schedule virtual visit with [name], NP. Per 04/21/18 OV note with PCP [facility]- pt was due back in 09/2018.

## 2018-11-27 NOTE — Telephone Encounter (Signed)
Patient states she did not need any refills at this moment.   But will call to set up a V visit when she does.

## 2019-03-09 ENCOUNTER — Other Ambulatory Visit: Payer: Self-pay | Admitting: Family

## 2019-03-09 ENCOUNTER — Telehealth: Payer: Self-pay | Admitting: Family

## 2019-03-09 MED ORDER — LISINOPRIL 10 MG PO TABS: 30.0000 mg | ORAL_TABLET | Freq: Every day | ORAL | 0 refills | Status: DC

## 2019-03-09 NOTE — Telephone Encounter (Signed)
We received refill request for Lisinopril; what is her plan for primary care needs? According to OV notes her she was due in March and contacted again to schedule in April. Will give one more refill of Lisinopril but needs to establish with new PCP.

## 2019-03-10 NOTE — Telephone Encounter (Signed)
Spoke with patient and info given. She will call back to schedule TOC appointment with in the next month. She knows she will need to schedule appointment in order to continue to receive refills.

## 2019-03-10 NOTE — Telephone Encounter (Signed)
It's fine but she has to be seen in the next month to keep getting refills.

## 2019-03-13 ENCOUNTER — Ambulatory Visit: Payer: Medicare Other | Admitting: Internal Medicine

## 2019-05-01 ENCOUNTER — Other Ambulatory Visit (INDEPENDENT_AMBULATORY_CARE_PROVIDER_SITE_OTHER): Payer: Medicare Other

## 2019-05-01 ENCOUNTER — Ambulatory Visit (INDEPENDENT_AMBULATORY_CARE_PROVIDER_SITE_OTHER): Payer: Medicare Other | Admitting: Family

## 2019-05-01 ENCOUNTER — Telehealth: Payer: Self-pay | Admitting: Family

## 2019-05-01 ENCOUNTER — Other Ambulatory Visit: Payer: Self-pay

## 2019-05-01 ENCOUNTER — Encounter: Payer: Self-pay | Admitting: Family

## 2019-05-01 VITALS — BP 140/80 | HR 49 | Temp 97.9°F | Ht 64.0 in | Wt 159.0 lb

## 2019-05-01 DIAGNOSIS — Z1211 Encounter for screening for malignant neoplasm of colon: Secondary | ICD-10-CM

## 2019-05-01 DIAGNOSIS — I10 Essential (primary) hypertension: Secondary | ICD-10-CM | POA: Diagnosis not present

## 2019-05-01 DIAGNOSIS — E119 Type 2 diabetes mellitus without complications: Secondary | ICD-10-CM | POA: Diagnosis not present

## 2019-05-01 DIAGNOSIS — Z23 Encounter for immunization: Secondary | ICD-10-CM

## 2019-05-01 DIAGNOSIS — F418 Other specified anxiety disorders: Secondary | ICD-10-CM

## 2019-05-01 DIAGNOSIS — E782 Mixed hyperlipidemia: Secondary | ICD-10-CM | POA: Diagnosis not present

## 2019-05-01 DIAGNOSIS — E213 Hyperparathyroidism, unspecified: Secondary | ICD-10-CM

## 2019-05-01 LAB — COMPREHENSIVE METABOLIC PANEL
ALT: 29 U/L (ref 0–35)
AST: 33 U/L (ref 0–37)
Albumin: 4 g/dL (ref 3.5–5.2)
Alkaline Phosphatase: 92 U/L (ref 39–117)
BUN: 11 mg/dL (ref 6–23)
CO2: 25 mEq/L (ref 19–32)
Calcium: 11.1 mg/dL — ABNORMAL HIGH (ref 8.4–10.5)
Chloride: 105 mEq/L (ref 96–112)
Creatinine, Ser: 0.76 mg/dL (ref 0.40–1.20)
GFR: 74.87 mL/min (ref >60.00)
Glucose, Bld: 129 mg/dL — ABNORMAL HIGH (ref 70–99)
Potassium: 4.5 mEq/L (ref 3.5–5.1)
Sodium: 138 mEq/L (ref 135–145)
Total Bilirubin: 1.1 mg/dL (ref 0.2–1.2)
Total Protein: 7.3 g/dL (ref 6.0–8.3)

## 2019-05-01 LAB — CBC WITH DIFFERENTIAL/PLATELET
Basophils Absolute: 0.1 10*3/uL (ref 0.0–0.1)
Basophils Relative: 1 % (ref 0.0–3.0)
Eosinophils Absolute: 0.2 10*3/uL (ref 0.0–0.7)
Eosinophils Relative: 2.3 % (ref 0.0–5.0)
HCT: 44.9 % (ref 36.0–46.0)
Hemoglobin: 15.1 g/dL — ABNORMAL HIGH (ref 12.0–15.0)
Lymphocytes Relative: 36.6 % (ref 12.0–46.0)
Lymphs Abs: 3.1 10*3/uL (ref 0.7–4.0)
MCHC: 33.7 g/dL (ref 30.0–36.0)
MCV: 91.1 fl (ref 78.0–100.0)
Monocytes Absolute: 0.7 10*3/uL (ref 0.1–1.0)
Monocytes Relative: 8.1 % (ref 3.0–12.0)
Neutro Abs: 4.5 10*3/uL (ref 1.4–7.7)
Neutrophils Relative %: 52 % (ref 43.0–77.0)
Platelets: 283 10*3/uL (ref 150.0–400.0)
RBC: 4.93 Mil/uL (ref 3.87–5.11)
RDW: 13.8 % (ref 11.5–15.5)
WBC: 8.6 10*3/uL (ref 4.0–10.5)

## 2019-05-01 LAB — LIPID PANEL
Cholesterol: 184 mg/dL (ref 0–200)
HDL: 48.1 mg/dL (ref >39.00)
LDL Cholesterol: 106 mg/dL — ABNORMAL HIGH (ref 0–99)
NonHDL: 136.31
Total CHOL/HDL Ratio: 4
Triglycerides: 150 mg/dL — ABNORMAL HIGH (ref 0.0–149.0)
VLDL: 30 mg/dL (ref 0.0–40.0)

## 2019-05-01 LAB — HEMOGLOBIN A1C: Hgb A1c MFr Bld: 6.7 % — ABNORMAL HIGH (ref 4.6–6.5)

## 2019-05-01 MED ORDER — LISINOPRIL 10 MG PO TABS: 30.0000 mg | ORAL_TABLET | Freq: Every day | ORAL | 3 refills | Status: DC

## 2019-05-01 MED ORDER — EZETIMIBE 10 MG PO TABS: 10.0000 mg | ORAL_TABLET | Freq: Every day | ORAL | 3 refills | Status: DC

## 2019-05-01 MED ORDER — CLONIDINE HCL 0.1 MG PO TABS: 0.1000 mg | ORAL_TABLET | Freq: Two times a day (BID) | ORAL | 3 refills | Status: DC

## 2019-05-01 MED ORDER — ALPRAZOLAM 0.5 MG PO TABS: 0.5000 mg | ORAL_TABLET | Freq: Every day | ORAL | 0 refills | Status: DC | PRN

## 2019-05-01 MED ORDER — TETANUS-DIPHTH-ACELL PERTUSSIS 5-2.5-18.5 LF-MCG/0.5 IM SUSP: 0.5000 mL | Freq: Once | INTRAMUSCULAR | 0 refills | Status: AC

## 2019-05-01 MED ORDER — METOPROLOL TARTRATE 50 MG PO TABS: 50.0000 mg | ORAL_TABLET | Freq: Two times a day (BID) | ORAL | 3 refills | Status: DC

## 2019-05-01 MED ORDER — FAMOTIDINE 20 MG PO TABS: 20.0000 mg | ORAL_TABLET | Freq: Two times a day (BID) | ORAL | 3 refills | Status: DC

## 2019-05-01 NOTE — Telephone Encounter (Signed)
Pt called in to update provider. She was told by pharmacy that her insurance will not cover 3 pills (lisinopril (ZESTRIL) 10 MG tablet) ... they will only cover 2 or less.   Pt is requesting further assistance

## 2019-05-01 NOTE — Progress Notes (Signed)
Courtney Russell is a 73 y.o. female with the following history as recorded in EpicCare:  Patient Active Problem List   Diagnosis Date Noted  . Osteoporosis without current pathological fracture 09/05/2018  . Hyperparathyroidism ([place]) 09/05/2018  . Vitamin D deficiency 10/01/2016  . Routine general medical examination at a health care facility 09/27/2015  . Encounter for general adult medical examination with abnormal findings 09/27/2015  . Urinary urgency 09/27/2015  . Hypercalcemia 09/22/2013  . Nonspecific abnormal electrocardiogram (ECG) (EKG) 09/18/2012  . Irritable bowel disease 11/22/2010  . NIPPLE DISCHARGE 08/29/2010  . ANXIETY STATE, UNSPECIFIED 04/26/2010  . GERD 04/26/2010  . Type 2 diabetes mellitus ([place]) 04/21/2009  . POSTMENOPAUSAL SYNDROME 04/21/2009  . NONSPEC ELEVATION OF LEVELS OF TRANSAMINASE/LDH 04/21/2009  . Hyperlipidemia 03/24/2007  . Essential hypertension 03/24/2007    Current Outpatient Medications  Medication Sig Dispense Refill  . aspirin 81 MG tablet Take 81 mg by mouth 1 dose over 46 hours.      . cholecalciferol (VITAMIN D) 1000 units tablet Vitamin D    . cloNIDine (CATAPRES) 0.1 MG tablet Take 1 tablet (0.1 mg total) by mouth 2 (two) times daily. 180 tablet 3  . esomeprazole (NEXIUM) 40 MG capsule Take 1 capsule (40 mg total) by mouth daily at 12 noon. 90 capsule 0  . ezetimibe (ZETIA) 10 MG tablet Take 1 tablet (10 mg total) by mouth daily. 90 tablet 3  . lisinopril (ZESTRIL) 10 MG tablet Take 3 tablets (30 mg total) by mouth daily. 270 tablet 3  . metoprolol tartrate (LOPRESSOR) 50 MG tablet Take 1 tablet (50 mg total) by mouth 2 (two) times daily. 180 tablet 3  . ALPRAZolam (XANAX) 0.5 MG tablet Take 1 tablet (0.5 mg total) by mouth daily as needed for anxiety. 30 tablet 0  . famotidine (PEPCID) 20 MG tablet Take 1 tablet (20 mg total) by mouth 2 (two) times daily. 180 tablet 3  . Tdap (BOOSTRIX) 5-2.5-18.5 LF-MCG/0.5 injection Inject 0.5 mLs  into the muscle once for 1 dose. 0.5 mL 0   No current facility-administered medications for this visit.     Allergies: Dexbromphen-acetaminophen, Fish oil, Codeine, Bisoprolol-hydrochlorothiazide, Crestor [rosuvastatin], Ezetimibe-simvastatin, Pravastatin, Hydrochlorothiazide, and Other  Past Medical History:  Diagnosis Date  . GERD (gastroesophageal reflux disease)   . High cholesterol   . Hypertension   . IBS (irritable bowel syndrome)   . Palpitations   . Prediabetes     Past Surgical History:  Procedure Laterality Date  . ABDOMINAL HYSTERECTOMY  1992   fibroids  . BILATERAL SALPINGOOPHORECTOMY  1996   cysts  . BREAST SURGERY     duct excision  . CHOLECYSTECTOMY    . COLONOSCOPY  1998, 2008   negative;Dr Olevia Perches  . HERNIA REPAIR    . TONSILLECTOMY    . TUBAL LIGATION      Family History  Problem Relation Age of Onset  . Cancer Father        COLON & PROSTATE  . Diabetes Father   . Alzheimer's disease Father   . Heart attack Father        MI in late 60s; CABG @ 2  . Stroke Father        in 107s  . Dementia Mother   . Transient ischemic attack Mother        TIAs; AVM leaked in 57s  . Colon polyps Mother   . Coronary artery disease Sister        stent @ 54  .  Breast cancer Sister   . Diabetes Paternal Aunt     Social History   Tobacco Use  . Smoking status: Never Smoker  . Smokeless tobacco: Never Used  Substance Use Topics  . Alcohol use: No    Subjective:  Presents today for TOC; her previous PCP left our office earlier this year;  History of hypertension, hyperlipidemia- statin intolerant, diet controlled Type 2 diabetes, GERD; working with endocrine for hyperparathyroidism; notes that blood pressure is always elevated at office- at home, averages 130/80.  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 Fecal DNA (Cologuard)  10/13/2018  . HEMOGLOBIN A1C  10/20/2018  . TETANUS/TDAP  04/22/2019  . OPHTHALMOLOGY EXAM  09/30/2019 (Originally 09/13/2017)  . FOOT EXAM  04/30/2020  . MAMMOGRAM  07/29/2020  . INFLUENZA VACCINE  Completed  . DEXA SCAN  Completed  . Hepatitis C Screening  Completed  . PNA vac Low Risk Adult  Completed      Objective:  Vitals:   05/01/19 0946  BP: 140/80  Pulse: (!) 49  Temp: 97.9 F (36.6 C)  TempSrc: Oral  SpO2: 98%  Weight: 159 lb (72.1 kg)  Height: _0  (1.626 m)    General: Well developed, well nourished, in no acute distress  Skin : Warm and dry.  Head: Normocephalic and atraumatic  Eyes: Sclera and conjunctiva clear; pupils round and reactive to light; extraocular movements intact  Ears: External normal; canals clear; tympanic membranes normal  Oropharynx: Pink, supple. No suspicious lesions  Neck: Supple without thyromegaly, adenopathy  Lungs: Respirations unlabored; clear to auscultation bilaterally without wheeze, rales, rhonchi  CVS exam: normal rate and regular rhythm.  Abdomen: Soft; nontender; nondistended; normoactive bowel sounds; no masses or hepatosplenomegaly  Musculoskeletal: No deformities; no active joint inflammation; bilateral foot exam done- normal;  Extremities: No edema, cyanosis, clubbing  Vessels: Symmetric bilaterally  Neurologic: Alert and oriented; speech intact; face symmetrical; moves all extremities well; CNII-XII intact without focal deficit   Assessment:  1. Essential hypertension   2. Need for Tdap vaccination   3. Colon cancer screening   4. Type 2 diabetes mellitus without complication, without long-term current use of insulin ([place])   5. Mixed hyperlipidemia   6. Hyperparathyroidism ([place])   7. Situational anxiety     Plan:  1. Stable;  known white-coat; she checks regularly at home and is consistently 130/80. 2. Order sent to pharmacy; 3. Order updated for Cologuard; 4. Foot exam done; check Hgba1c; plan to follow-up in 6 months; 5. Statin intolerant; check lipid panel today; continue Zetia; 6. Encouraged to schedule follow-up with endocrine; 7. Rx for Xanax 0.5 mg qd prn; #30/ 0 Rf;   No follow-ups on file.  Orders Placed This Encounter  Procedures  . Cologuard  . CBC w/Diff    Standing Status:   Future    Number of Occurrences:   1    Standing Expiration Date:   04/30/2020  . Comp Met (CMET)    Standing Status:   Future    Number of Occurrences:   1    Standing Expiration Date:   04/30/2020  . HgB A1c  Standing Status:   Future    Number of Occurrences:   1    Standing Expiration Date:   04/30/2020  . Lipid panel    Standing Status:   Future    Number of Occurrences:   1    Standing Expiration Date:   04/30/2020    Requested Prescriptions   Signed Prescriptions Disp Refills  . Tdap (BOOSTRIX) 5-2.5-18.5 LF-MCG/0.5 injection 0.5 mL 0    Sig: Inject 0.5 mLs into the muscle once for 1 dose.  . cloNIDine (CATAPRES) 0.1 MG tablet 180 tablet 3    Sig: Take 1 tablet (0.1 mg total) by mouth 2 (two) times daily.  Marland Kitchen ezetimibe (ZETIA) 10 MG tablet 90 tablet 3    Sig: Take 1 tablet (10 mg total) by mouth daily.  Marland Kitchen lisinopril (ZESTRIL) 10 MG tablet 270 tablet 3    Sig: Take 3 tablets (30 mg total) by mouth daily.  . metoprolol tartrate (LOPRESSOR) 50 MG tablet 180 tablet 3    Sig: Take 1 tablet (50 mg total) by mouth 2 (two) times daily.  . famotidine (PEPCID) 20 MG tablet 180 tablet 3    Sig: Take 1 tablet (20 mg total) by mouth 2 (two) times daily.  Marland Kitchen ALPRAZolam (XANAX) 0.5 MG tablet 30 tablet 0    Sig: Take 1 tablet (0.5 mg total) by mouth daily as needed for anxiety.

## 2019-05-06 MED ORDER — LISINOPRIL 10 MG PO TABS: 10.0000 mg | ORAL_TABLET | Freq: Every day | ORAL | 3 refills | Status: DC

## 2019-05-06 MED ORDER — LISINOPRIL 20 MG PO TABS: 20.0000 mg | ORAL_TABLET | Freq: Every day | ORAL | 3 refills | Status: DC

## 2019-05-06 NOTE — Telephone Encounter (Signed)
We are not going to do a PA for this; I will send in the 20 mg and 10 mg; if they won't cover, then she will need to go to the 40 mg which I already talked to her about at her OV last week.

## 2019-05-06 NOTE — Telephone Encounter (Signed)
Spoke with patient and info given 

## 2019-05-23 LAB — COLOGUARD
Cologuard: NEGATIVE
Cologuard: NEGATIVE

## 2019-06-19 ENCOUNTER — Telehealth: Payer: Self-pay

## 2019-06-19 ENCOUNTER — Encounter: Payer: Self-pay | Admitting: Family

## 2019-06-19 NOTE — Telephone Encounter (Signed)
Spoke with patient and results given. 

## 2019-06-19 NOTE — Progress Notes (Signed)
Outside notes received. Information abstracted. Notes sent to scan.  

## 2019-08-05 IMAGING — US US ABDOMEN LIMITED
1 series · 14 of 25 positions shown · non-contrast
Comparison: Report of an abdominal ultrasound dated [DATE][REDACTED],
4004.

CLINICAL DATA: Elevated liver function studies, previous
cholecystectomy.

EXAM:
ULTRASOUND ABDOMEN LIMITED RIGHT UPPER QUADRANT

[Series 1: us abdomen limited · 0.23mm/px · 14 of 48 slices shown]
[im 1/48]
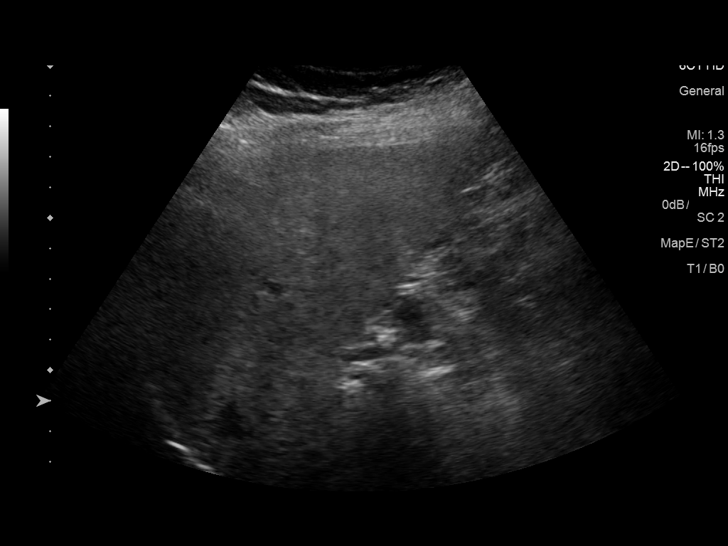
[im 4/48]
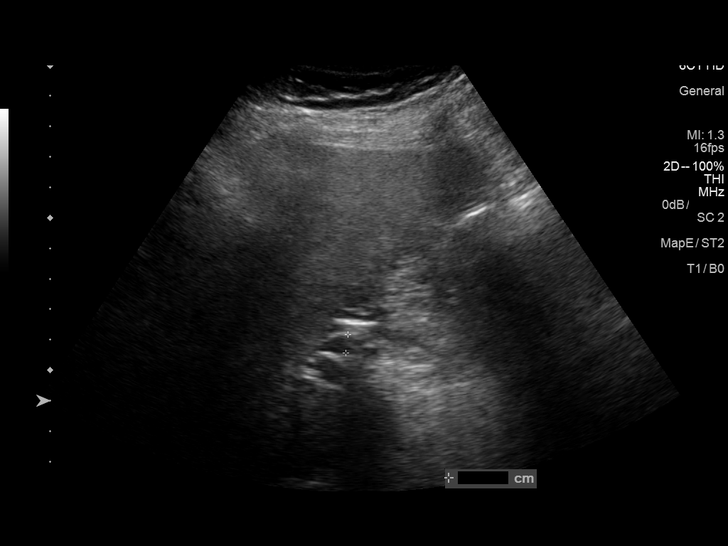
[im 8/48]
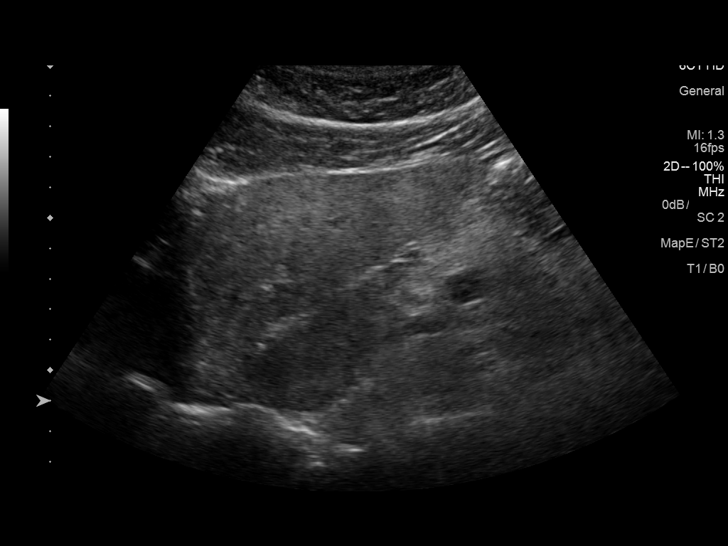
[im 12/48]
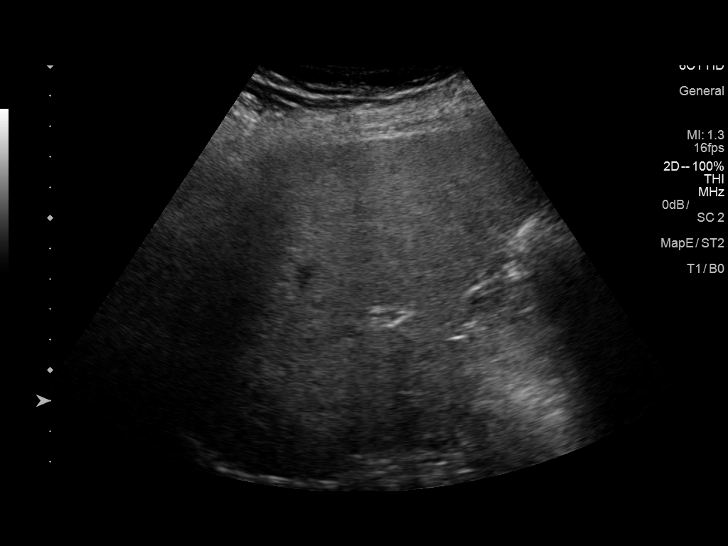
[im 16/48]
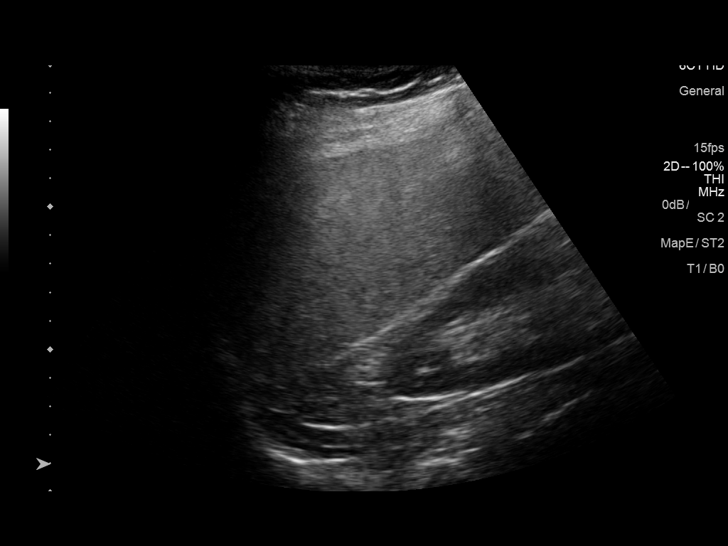
[im 18/48]
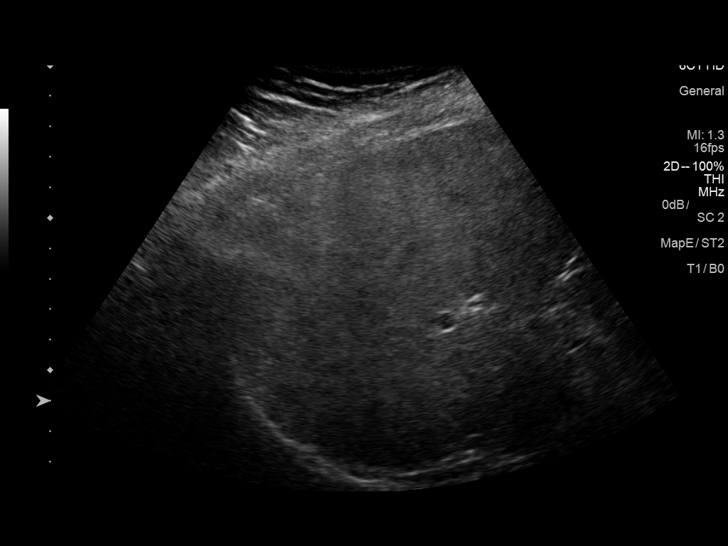
[im 22/48]
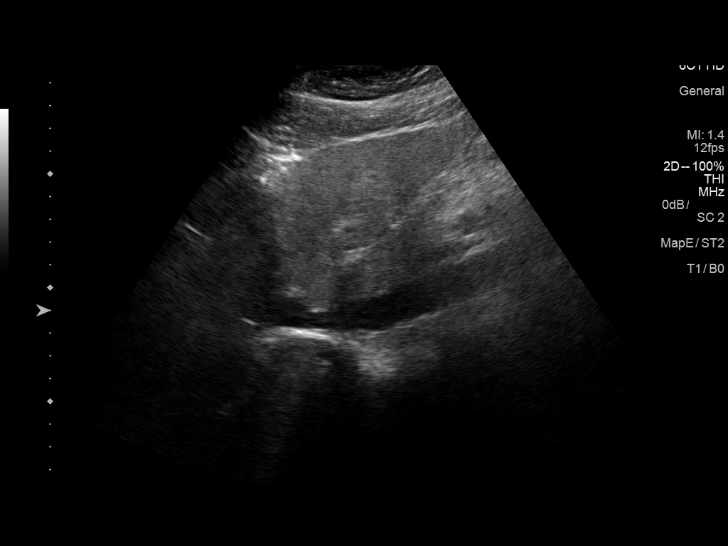
[im 26/48]
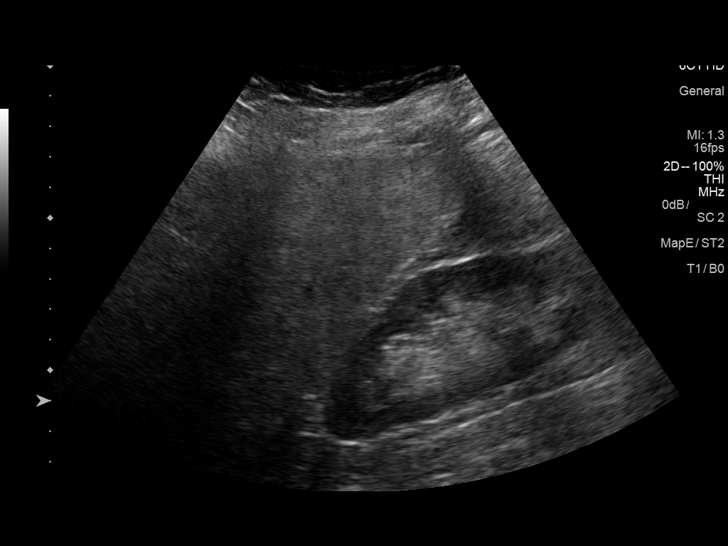
[im 30/48]
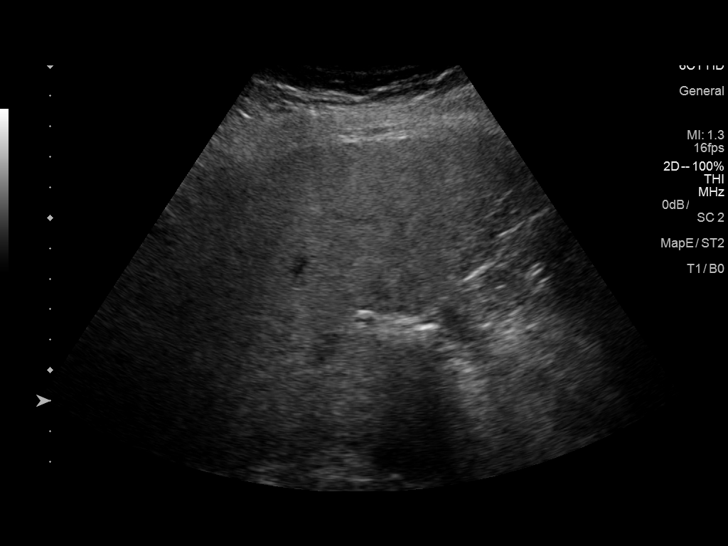
[im 32/48]
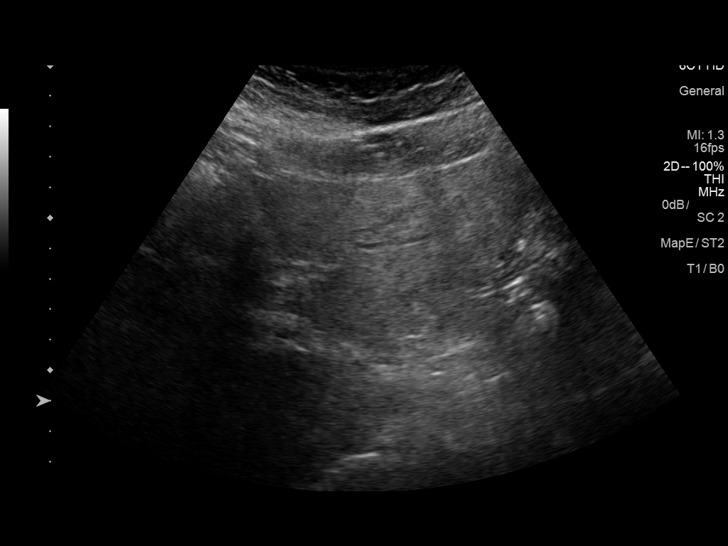
[im 36/48]
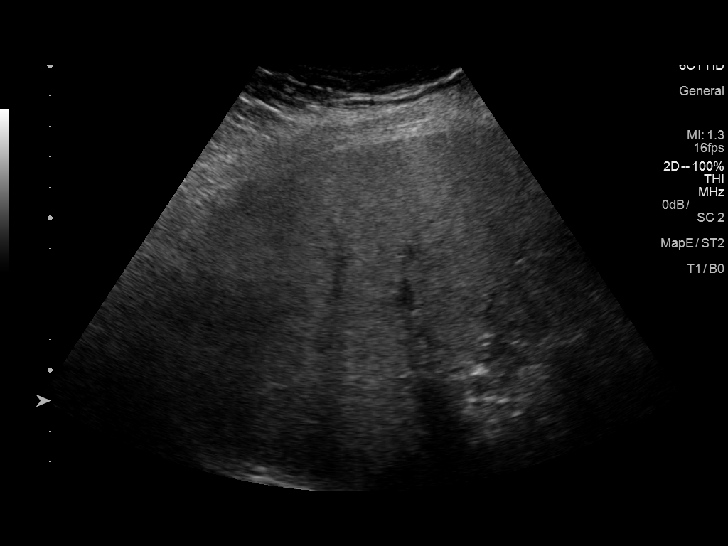
[im 40/48]
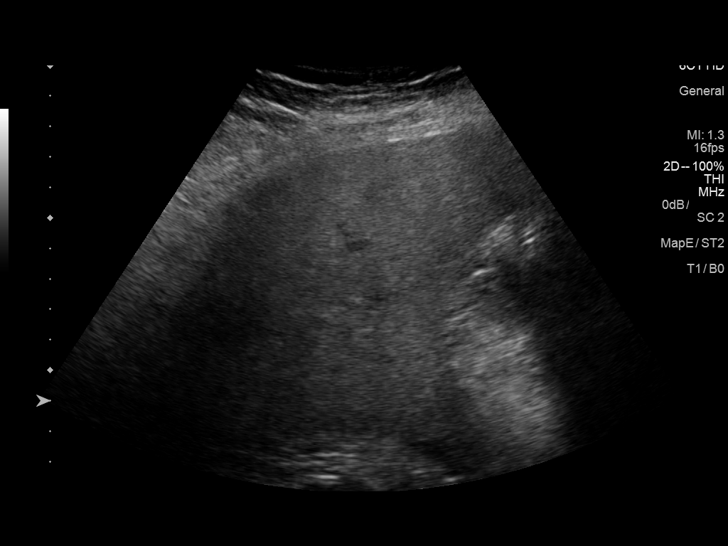
[im 44/48]
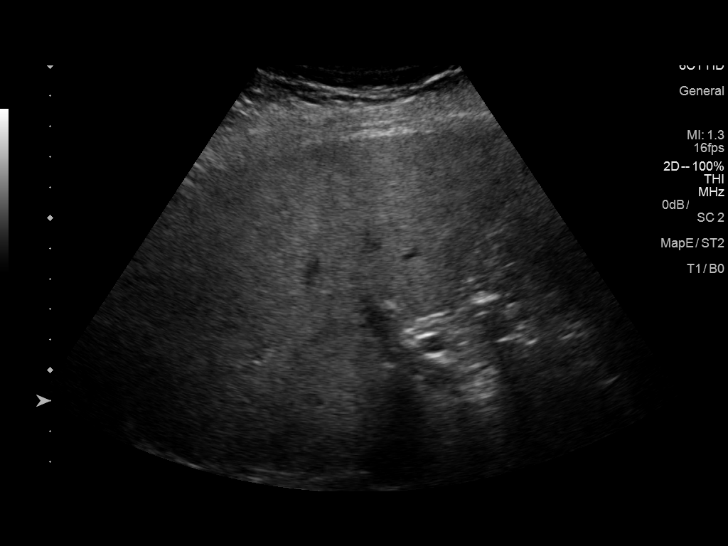
[im 48/48]
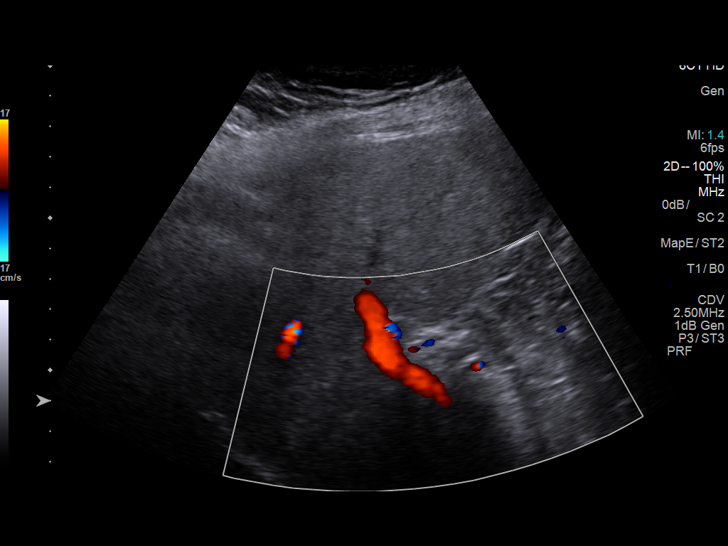

[14 of 25 positions shown; findings below may reference images not displayed]

FINDINGS: Gallbladder:

The gallbladder surgically absent.

Common bile duct:

Diameter: 6.1 mm

Liver:

The hepatic echotexture is increased diffusely. There are areas of
decreased echogenicity noted throughout the liver. There is no
discrete mass. There is no intrahepatic ductal dilation. The surface
contour of the liver is normal. Portal vein is patent on color
Doppler imaging with normal direction of blood flow towards the
liver.
IMPRESSION: Heterogeneously increased hepatic echotexture is most compatible
with fatty infiltrative change and focal fatty sparing. However, if
the patient's elevated liver function studies remain unexplained,
hepatic protocol MRI would be a useful next imaging step.

## 2019-09-23 ENCOUNTER — Ambulatory Visit (INDEPENDENT_AMBULATORY_CARE_PROVIDER_SITE_OTHER): Payer: Medicare PPO | Admitting: Family

## 2019-09-23 DIAGNOSIS — J019 Acute sinusitis, unspecified: Secondary | ICD-10-CM

## 2019-09-23 MED ORDER — AMOXICILLIN-POT CLAVULANATE 875-125 MG PO TABS: 1.0000 | ORAL_TABLET | Freq: Two times a day (BID) | ORAL | 0 refills | Status: AC

## 2019-09-23 NOTE — Progress Notes (Signed)
Courtney Russell is a 74 y.o. female with the following history as recorded in EpicCare:  Patient Active Problem List   Diagnosis Date Noted  . Osteoporosis without current pathological fracture 09/05/2018  . Hyperparathyroidism ([place]) 09/05/2018  . Vitamin D deficiency 10/01/2016  . Routine general medical examination at a health care facility 09/27/2015  . Encounter for general adult medical examination with abnormal findings 09/27/2015  . Urinary urgency 09/27/2015  . Hypercalcemia 09/22/2013  . Nonspecific abnormal electrocardiogram (ECG) (EKG) 09/18/2012  . Irritable bowel disease 11/22/2010  . NIPPLE DISCHARGE 08/29/2010  . ANXIETY STATE, UNSPECIFIED 04/26/2010  . GERD 04/26/2010  . Type 2 diabetes mellitus ([place]) 04/21/2009  . POSTMENOPAUSAL SYNDROME 04/21/2009  . NONSPEC ELEVATION OF LEVELS OF TRANSAMINASE/LDH 04/21/2009  . Hyperlipidemia 03/24/2007  . Essential hypertension 03/24/2007    Current Outpatient Medications  Medication Sig Dispense Refill  . ALPRAZolam (XANAX) 0.5 MG tablet Take 1 tablet (0.5 mg total) by mouth daily as needed for anxiety. 30 tablet 0  . amoxicillin-clavulanate (AUGMENTIN) 875-125 MG tablet Take 1 tablet by mouth 2 (two) times daily for 10 days. 20 tablet 0  . aspirin 81 MG tablet Take 81 mg by mouth 1 dose over 46 hours.      . cholecalciferol (VITAMIN D) 1000 units tablet Vitamin D    . cloNIDine (CATAPRES) 0.1 MG tablet Take 1 tablet (0.1 mg total) by mouth 2 (two) times daily. 180 tablet 3  . esomeprazole (NEXIUM) 40 MG capsule Take 1 capsule (40 mg total) by mouth daily at 12 noon. 90 capsule 0  . ezetimibe (ZETIA) 10 MG tablet Take 1 tablet (10 mg total) by mouth daily. 90 tablet 3  . famotidine (PEPCID) 20 MG tablet Take 1 tablet (20 mg total) by mouth 2 (two) times daily. 180 tablet 3  . lisinopril (ZESTRIL) 10 MG tablet Take 1 tablet (10 mg total) by mouth daily. 90 tablet 3  . lisinopril (ZESTRIL) 20 MG tablet Take 1 tablet (20 mg total)  by mouth daily. 90 tablet 3  . metoprolol tartrate (LOPRESSOR) 50 MG tablet Take 1 tablet (50 mg total) by mouth 2 (two) times daily. 180 tablet 3   No current facility-administered medications for this visit.    Allergies: Dexbromphen-acetaminophen, Fish oil, Codeine, Bisoprolol-hydrochlorothiazide, Crestor [rosuvastatin], Ezetimibe-simvastatin, Pravastatin, Hydrochlorothiazide, and Other  Past Medical History:  Diagnosis Date  . GERD (gastroesophageal reflux disease)   . High cholesterol   . Hypertension   . IBS (irritable bowel syndrome)   . Palpitations   . Prediabetes     Past Surgical History:  Procedure Laterality Date  . ABDOMINAL HYSTERECTOMY  1992   fibroids  . BILATERAL SALPINGOOPHORECTOMY  1996   cysts  . BREAST SURGERY     duct excision  . CHOLECYSTECTOMY    . COLONOSCOPY  1998, 2008   negative;Dr Olevia Perches  . HERNIA REPAIR    . TONSILLECTOMY    . TUBAL LIGATION      Family History  Problem Relation Age of Onset  . Cancer Father        COLON & PROSTATE  . Diabetes Father   . Alzheimer's disease Father   . Heart attack Father        MI in late 24s; CABG @ 7  . Stroke Father        in 28s  . Dementia Mother   . Transient ischemic attack Mother        TIAs; AVM leaked in 42s  . Colon  polyps Mother   . Coronary artery disease Sister        stent @ 64  . Breast cancer Sister   . Diabetes Paternal Aunt     Social History   Tobacco Use  . Smoking status: Never Smoker  . Smokeless tobacco: Never Used  Substance Use Topics  . Alcohol use: No    Subjective:   I connected with Courtney Russell on 09/23/19 at  8:40 AM EST by a telephone call and verified that I am speaking with the correct person using two identifiers.   I discussed the limitations of evaluation and management by telemedicine and the availability of in person appointments. The patient expressed understanding and agreed to proceed. Provider in office/ patient is at home; provider and patient  are only 2 people on telephone call.   Head and sinus congestion x 2-3 days; is adamant that she has a sinus infection- requesting antibiotic; denies any concerns for exposure to COVID or testing at this time; per patient, she cannot tolerate nasal steroids;     Objective:  There were no vitals filed for this visit.  Lungs: Respirations unlabored;  Neurologic: Alert and oriented;   Assessment:  1. Acute sinusitis, recurrence not specified, unspecified location     Plan:  Rx for Augmentin 875 mg bid x 10 days; encouraged to use saline spray for dryness; increase fluids, rest and follow-up worse, no better; If symptoms persist, will need to consider COVID testing;   Time spent 8 minutes  No follow-ups on file.  No orders of the defined types were placed in this encounter.   Requested Prescriptions   Signed Prescriptions Disp Refills  . amoxicillin-clavulanate (AUGMENTIN) 875-125 MG tablet 20 tablet 0    Sig: Take 1 tablet by mouth 2 (two) times daily for 10 days.

## 2019-09-28 ENCOUNTER — Telehealth: Payer: Self-pay | Admitting: Family

## 2019-09-28 NOTE — Telephone Encounter (Signed)
I don't know of any specific contraindication but it may be better to delay the vaccine for about 2 weeks from date of last antibiotic.

## 2019-09-28 NOTE — Telephone Encounter (Signed)
Spoke with patient and advice given.

## 2019-09-28 NOTE — Telephone Encounter (Signed)
New Message:   Patient was just calling to inquire about taking the vaccine while on a antibiotic. Please advise.

## 2020-01-05 LAB — HM MAMMOGRAPHY

## 2020-01-21 ENCOUNTER — Encounter: Payer: Self-pay | Admitting: Family

## 2020-01-21 NOTE — Progress Notes (Signed)
Outside notes received. Information abstracted. Notes sent to scan.  

## 2020-05-04 ENCOUNTER — Ambulatory Visit: Payer: Medicare PPO | Admitting: Family

## 2020-05-09 ENCOUNTER — Ambulatory Visit (INDEPENDENT_AMBULATORY_CARE_PROVIDER_SITE_OTHER): Payer: Medicare PPO | Admitting: Family

## 2020-05-09 ENCOUNTER — Other Ambulatory Visit: Payer: Self-pay

## 2020-05-09 ENCOUNTER — Encounter: Payer: Self-pay | Admitting: Family

## 2020-05-09 VITALS — BP 122/86 | HR 54 | Temp 98.1°F | Ht 64.0 in | Wt 155.0 lb

## 2020-05-09 DIAGNOSIS — Z0000 Encounter for general adult medical examination without abnormal findings: Secondary | ICD-10-CM | POA: Diagnosis not present

## 2020-05-09 DIAGNOSIS — E119 Type 2 diabetes mellitus without complications: Secondary | ICD-10-CM

## 2020-05-09 DIAGNOSIS — K219 Gastro-esophageal reflux disease without esophagitis: Secondary | ICD-10-CM | POA: Diagnosis not present

## 2020-05-09 DIAGNOSIS — I10 Essential (primary) hypertension: Secondary | ICD-10-CM

## 2020-05-09 DIAGNOSIS — E782 Mixed hyperlipidemia: Secondary | ICD-10-CM | POA: Diagnosis not present

## 2020-05-09 DIAGNOSIS — Z789 Other specified health status: Secondary | ICD-10-CM

## 2020-05-09 LAB — CBC WITH DIFFERENTIAL/PLATELET
Basophils Absolute: 0.1 10*3/uL (ref 0.0–0.1)
Basophils Relative: 1 % (ref 0.0–3.0)
Eosinophils Absolute: 0.2 10*3/uL (ref 0.0–0.7)
Eosinophils Relative: 2.3 % (ref 0.0–5.0)
HCT: 45 % (ref 36.0–46.0)
Hemoglobin: 15.1 g/dL — ABNORMAL HIGH (ref 12.0–15.0)
Lymphocytes Relative: 35.6 % (ref 12.0–46.0)
Lymphs Abs: 2.7 10*3/uL (ref 0.7–4.0)
MCHC: 33.6 g/dL (ref 30.0–36.0)
MCV: 91.9 fl (ref 78.0–100.0)
Monocytes Absolute: 0.6 10*3/uL (ref 0.1–1.0)
Monocytes Relative: 7.9 % (ref 3.0–12.0)
Neutro Abs: 4.1 10*3/uL (ref 1.4–7.7)
Neutrophils Relative %: 53.2 % (ref 43.0–77.0)
Platelets: 268 10*3/uL (ref 150.0–400.0)
RBC: 4.89 Mil/uL (ref 3.87–5.11)
RDW: 14.1 % (ref 11.5–15.5)
WBC: 7.7 10*3/uL (ref 4.0–10.5)

## 2020-05-09 LAB — LIPID PANEL
Cholesterol: 187 mg/dL (ref 0–200)
HDL: 49.6 mg/dL (ref >39.00)
LDL Cholesterol: 109 mg/dL — ABNORMAL HIGH (ref 0–99)
NonHDL: 137.42
Total CHOL/HDL Ratio: 4
Triglycerides: 141 mg/dL (ref 0.0–149.0)
VLDL: 28.2 mg/dL (ref 0.0–40.0)

## 2020-05-09 LAB — COMPREHENSIVE METABOLIC PANEL
ALT: 39 U/L — ABNORMAL HIGH (ref 0–35)
AST: 50 U/L — ABNORMAL HIGH (ref 0–37)
Albumin: 3.9 g/dL (ref 3.5–5.2)
Alkaline Phosphatase: 85 U/L (ref 39–117)
BUN: 11 mg/dL (ref 6–23)
CO2: 27 mEq/L (ref 19–32)
Calcium: 10.6 mg/dL — ABNORMAL HIGH (ref 8.4–10.5)
Chloride: 105 mEq/L (ref 96–112)
Creatinine, Ser: 0.81 mg/dL (ref 0.40–1.20)
GFR: 72.18 mL/min (ref >60.00)
Glucose, Bld: 131 mg/dL — ABNORMAL HIGH (ref 70–99)
Potassium: 4.4 mEq/L (ref 3.5–5.1)
Sodium: 139 mEq/L (ref 135–145)
Total Bilirubin: 1.3 mg/dL — ABNORMAL HIGH (ref 0.2–1.2)
Total Protein: 7 g/dL (ref 6.0–8.3)

## 2020-05-09 LAB — HEMOGLOBIN A1C: Hgb A1c MFr Bld: 6.7 % — ABNORMAL HIGH (ref 4.6–6.5)

## 2020-05-09 MED ORDER — FAMOTIDINE 20 MG PO TABS: 20.0000 mg | ORAL_TABLET | Freq: Two times a day (BID) | ORAL | 3 refills | Status: DC

## 2020-05-09 MED ORDER — LISINOPRIL 20 MG PO TABS: 20.0000 mg | ORAL_TABLET | Freq: Every day | ORAL | 3 refills | Status: DC

## 2020-05-09 MED ORDER — ESOMEPRAZOLE MAGNESIUM 40 MG PO CPDR: 40.0000 mg | DELAYED_RELEASE_CAPSULE | Freq: Every day | ORAL | 3 refills | Status: DC

## 2020-05-09 MED ORDER — METOPROLOL TARTRATE 50 MG PO TABS: 50.0000 mg | ORAL_TABLET | Freq: Two times a day (BID) | ORAL | 3 refills | Status: DC

## 2020-05-09 MED ORDER — LISINOPRIL 10 MG PO TABS: 10.0000 mg | ORAL_TABLET | Freq: Every day | ORAL | 3 refills | Status: DC

## 2020-05-09 MED ORDER — EZETIMIBE 10 MG PO TABS: 10.0000 mg | ORAL_TABLET | Freq: Every day | ORAL | 3 refills | Status: DC

## 2020-05-09 MED ORDER — CLONIDINE HCL 0.1 MG PO TABS: 0.1000 mg | ORAL_TABLET | Freq: Two times a day (BID) | ORAL | 3 refills | Status: DC

## 2020-05-09 MED ORDER — ALPRAZOLAM 0.5 MG PO TABS: 0.5000 mg | ORAL_TABLET | Freq: Every day | ORAL | 0 refills | Status: DC | PRN

## 2020-05-09 NOTE — Progress Notes (Signed)
Courtney Russell is a 74 y.o. female with the following history as recorded in EpicCare:  Patient Active Problem List   Diagnosis Date Noted   Osteoporosis without current pathological fracture 09/05/2018   Hyperparathyroidism ([place]) 09/05/2018   Vitamin D deficiency 10/01/2016   Routine general medical examination at a health care facility 09/27/2015   Encounter for general adult medical examination with abnormal findings 09/27/2015   Urinary urgency 09/27/2015   Hypercalcemia 09/22/2013   Nonspecific abnormal electrocardiogram (ECG) (EKG) 09/18/2012   Irritable bowel disease 11/22/2010   NIPPLE DISCHARGE 08/29/2010   ANXIETY STATE, UNSPECIFIED 04/26/2010   GERD 04/26/2010   Type 2 diabetes mellitus ([place]) 04/21/2009   POSTMENOPAUSAL SYNDROME 04/21/2009   NONSPEC ELEVATION OF LEVELS OF TRANSAMINASE/LDH 04/21/2009   Hyperlipidemia 03/24/2007   Essential hypertension 03/24/2007    Current Outpatient Medications  Medication Sig Dispense Refill   ALPRAZolam (XANAX) 0.5 MG tablet Take 1 tablet (0.5 mg total) by mouth daily as needed for anxiety. 30 tablet 0   aspirin 81 MG tablet Take 81 mg by mouth 1 dose over 46 hours.       cholecalciferol (VITAMIN D) 1000 units tablet Vitamin D     cloNIDine (CATAPRES) 0.1 MG tablet Take 1 tablet (0.1 mg total) by mouth 2 (two) times daily. 180 tablet 3   esomeprazole (NEXIUM) 40 MG capsule Take 1 capsule (40 mg total) by mouth daily at 12 noon. 90 capsule 3   ezetimibe (ZETIA) 10 MG tablet Take 1 tablet (10 mg total) by mouth daily. 90 tablet 3   famotidine (PEPCID) 20 MG tablet Take 1 tablet (20 mg total) by mouth 2 (two) times daily. 180 tablet 3   lisinopril (ZESTRIL) 10 MG tablet Take 1 tablet (10 mg total) by mouth daily. 90 tablet 3   lisinopril (ZESTRIL) 20 MG tablet Take 1 tablet (20 mg total) by mouth daily. 90 tablet 3   metoprolol tartrate (LOPRESSOR) 50 MG tablet Take 1 tablet (50 mg total) by mouth 2 (two)  times daily. 180 tablet 3   No current facility-administered medications for this visit.    Allergies: Dexbromphen-acetaminophen, Fish oil, Codeine, Bisoprolol-hydrochlorothiazide, Crestor [rosuvastatin], Ezetimibe-simvastatin, Pravastatin, Hydrochlorothiazide, and Other  Past Medical History:  Diagnosis Date   GERD (gastroesophageal reflux disease)    High cholesterol    Hypertension    IBS (irritable bowel syndrome)    Palpitations    Prediabetes     Past Surgical History:  Procedure Laterality Date   ABDOMINAL HYSTERECTOMY  1992   fibroids   BILATERAL SALPINGOOPHORECTOMY  1996   cysts   BREAST SURGERY     duct excision   CHOLECYSTECTOMY     COLONOSCOPY  1998, 2008   negative;Dr Olevia Perches   HERNIA REPAIR     TONSILLECTOMY     TUBAL LIGATION      Family History  Problem Relation Age of Onset   Cancer Father        COLON & PROSTATE   Diabetes Father    Alzheimer's disease Father    Heart attack Father        MI in late 13s; CABG @ 48   Stroke Father        in 73s   Dementia Mother    Transient ischemic attack Mother        TIAs; AVM leaked in 75s   Colon polyps Mother    Coronary artery disease Sister        stent @ 49   Breast  cancer Sister    Diabetes Paternal Aunt     Social History   Tobacco Use   Smoking status: Never Smoker   Smokeless tobacco: Never Used  Substance Use Topics   Alcohol use: No    Subjective:  Presents for yearly CPE today; in baseline state of health; overdue to see eye doctor- planning to schedule; seeing dentist regularly;   Planning to get flu shot through her local pharmacy;   Health Maintenance  Topic Date Due   TETANUS/TDAP  04/22/2019   HEMOGLOBIN A1C  10/30/2019   FOOT EXAM  04/30/2020   OPHTHALMOLOGY EXAM  05/09/2020 (Originally 09/13/2017)   INFLUENZA VACCINE  10/27/2020 (Originally 02/28/2020)   MAMMOGRAM  01/04/2022   Fecal DNA (Cologuard)  05/22/2022   DEXA SCAN  Completed    COVID-19 Vaccine  Completed   Hepatitis C Screening  Completed   PNA vac Low Risk Adult  Completed    Review of Systems  Constitutional: Negative.   HENT: Negative.   Eyes: Negative.   Respiratory: Negative.   Cardiovascular: Negative.   Gastrointestinal: Negative.   Genitourinary: Negative.   Musculoskeletal: Negative.   Skin: Negative.   Neurological: Negative.   Endo/Heme/Allergies: Negative.   Psychiatric/Behavioral: Negative.       Objective:  Vitals:   05/09/20 0857  BP: 122/86  Pulse: (!) 54  Temp: 98.1 F (36.7 C)  TempSrc: Oral  SpO2: 96%  Weight: 155 lb (70.3 kg)  Height: '5\' 4"'  (1.626 m)    General: Well developed, well nourished, in no acute distress  Skin : Warm and dry.  Head: Normocephalic and atraumatic  Eyes: Sclera and conjunctiva clear; pupils round and reactive to light; extraocular movements intact  Ears: External normal; canals clear; tympanic membranes normal  Oropharynx: Pink, supple. No suspicious lesions  Neck: Supple without thyromegaly, adenopathy  Lungs: Respirations unlabored; clear to auscultation bilaterally without wheeze, rales, rhonchi  CVS exam: normal rate and regular rhythm.  Abdomen: Soft; nontender; nondistended; normoactive bowel sounds; no masses or hepatosplenomegaly  Musculoskeletal: No deformities; no active joint inflammation  Extremities: No edema, cyanosis, clubbing  Vessels: Symmetric bilaterally  Neurologic: Alert and oriented; speech intact; face symmetrical; moves all extremities well; CNII-XII intact without focal deficit  Assessment:  1. PE (physical exam), annual   2. Essential hypertension   3. Gastroesophageal reflux disease without esophagitis   4. Mixed hyperlipidemia   5. Type 2 diabetes mellitus without complication, without long-term current use of insulin ([facility])   6. Statin intolerance     Plan:  Age appropriate preventive healthcare needs addressed; encouraged regular eye doctor and dental exams;  encouraged regular exercise; will update labs and refills as needed today; follow-up to be determined; Patient like to follow-up 1 x per year only;  This visit occurred during the SARS-CoV-2 public health emergency.  Safety protocols were in place, including screening questions prior to the visit, additional usage of staff PPE, and extensive cleaning of exam room while observing appropriate contact time as indicated for disinfecting solutions.     No follow-ups on file.  Orders Placed This Encounter  Procedures   CBC with Differential/Platelet    Standing Status:   Future    Number of Occurrences:   1    Standing Expiration Date:   05/09/2021   Comp Met (CMET)    Standing Status:   Future    Number of Occurrences:   1    Standing Expiration Date:   05/09/2021   Lipid panel  Standing Status:   Future    Number of Occurrences:   1    Standing Expiration Date:   05/09/2021   HgB A1c    Standing Status:   Future    Number of Occurrences:   1    Standing Expiration Date:   05/09/2021    Requested Prescriptions   Signed Prescriptions Disp Refills   cloNIDine (CATAPRES) 0.1 MG tablet 180 tablet 3    Sig: Take 1 tablet (0.1 mg total) by mouth 2 (two) times daily.   esomeprazole (NEXIUM) 40 MG capsule 90 capsule 3    Sig: Take 1 capsule (40 mg total) by mouth daily at 12 noon.   ezetimibe (ZETIA) 10 MG tablet 90 tablet 3    Sig: Take 1 tablet (10 mg total) by mouth daily.   famotidine (PEPCID) 20 MG tablet 180 tablet 3    Sig: Take 1 tablet (20 mg total) by mouth 2 (two) times daily.   lisinopril (ZESTRIL) 10 MG tablet 90 tablet 3    Sig: Take 1 tablet (10 mg total) by mouth daily.   lisinopril (ZESTRIL) 20 MG tablet 90 tablet 3    Sig: Take 1 tablet (20 mg total) by mouth daily.   metoprolol tartrate (LOPRESSOR) 50 MG tablet 180 tablet 3    Sig: Take 1 tablet (50 mg total) by mouth 2 (two) times daily.

## 2020-05-10 ENCOUNTER — Other Ambulatory Visit: Payer: Self-pay | Admitting: Family

## 2020-05-10 DIAGNOSIS — R7989 Other specified abnormal findings of blood chemistry: Secondary | ICD-10-CM

## 2020-05-23 ENCOUNTER — Other Ambulatory Visit (INDEPENDENT_AMBULATORY_CARE_PROVIDER_SITE_OTHER): Payer: Medicare PPO

## 2020-05-23 DIAGNOSIS — R7989 Other specified abnormal findings of blood chemistry: Secondary | ICD-10-CM | POA: Diagnosis not present

## 2020-05-23 LAB — HEPATIC FUNCTION PANEL
ALT: 36 U/L — ABNORMAL HIGH (ref 0–35)
AST: 39 U/L — ABNORMAL HIGH (ref 0–37)
Albumin: 3.6 g/dL (ref 3.5–5.2)
Alkaline Phosphatase: 85 U/L (ref 39–117)
Bilirubin, Direct: 0.1 mg/dL (ref 0.0–0.3)
Total Bilirubin: 0.9 mg/dL (ref 0.2–1.2)
Total Protein: 6.1 g/dL (ref 6.0–8.3)

## 2020-05-26 ENCOUNTER — Telehealth: Payer: Self-pay | Admitting: Family

## 2020-05-26 NOTE — Telephone Encounter (Signed)
Patient was seen on 05/09/20 and is having sinus pressure in her ears and was wondering if she can get something called in for it.

## 2020-05-26 NOTE — Telephone Encounter (Signed)
[place], [place] Phone:  [phone]  Fax:  [phone]

## 2020-05-27 ENCOUNTER — Telehealth: Payer: Self-pay

## 2020-05-27 MED ORDER — AMOXICILLIN-POT CLAVULANATE 875-125 MG PO TABS: 1.0000 | ORAL_TABLET | Freq: Two times a day (BID) | ORAL | 0 refills | Status: AC

## 2020-05-27 NOTE — Telephone Encounter (Signed)
Patient is experience pressure, believes she has sinus infection, and would like something for dizziness as well.

## 2020-05-27 NOTE — Telephone Encounter (Signed)
We recommend that patients make OV if they feel they need an antibiotic. I will treat for her this time but in the future she should make an appointment.

## 2020-05-27 NOTE — Telephone Encounter (Signed)
Called pt no answer °

## 2020-05-27 NOTE — Telephone Encounter (Signed)
Is she asking for something to treat congestion or does she feel like she has a sinus infection?

## 2020-05-27 NOTE — Telephone Encounter (Signed)
Called pt to follow up on request no answer.

## 2020-10-03 ENCOUNTER — Telehealth: Payer: Self-pay | Admitting: Family

## 2020-10-03 NOTE — Telephone Encounter (Signed)
Called pt to schedule AWV with [facility]. Patient has declined having AWV.

## 2021-01-10 LAB — HM DEXA SCAN

## 2021-01-10 LAB — HM MAMMOGRAPHY

## 2021-01-24 ENCOUNTER — Telehealth: Payer: Self-pay | Admitting: Family

## 2021-01-24 MED ORDER — LISINOPRIL 20 MG PO TABS: 20.0000 mg | ORAL_TABLET | Freq: Every day | ORAL | 1 refills | Status: DC

## 2021-01-24 NOTE — Addendum Note (Signed)
Addended by: Kittie Plater, Alvin Diffee HUA on: 01/24/2021 10:36 AM   Modules accepted: Orders

## 2021-01-24 NOTE — Telephone Encounter (Signed)
Her mammogram is normal;  Her bone density does show osteoporosis. This has been documented in the past that she has osteoporosis. Does she want to take medication?  I actually would recommend that we have her see an endocrinologist due to the severity of the osteoporosis to discuss treatment options. She has seen Dr. Ranae Palms in the past and we can get that set back up for her.   Please let me know her response.

## 2021-01-24 NOTE — Telephone Encounter (Signed)
Pt has called back and she has received the results of the bone density and she is going to hold off right now because the last appointment with Dr. Jake Church stated that she needed surgery and she is not ready for that.   Also she needs a refill of the lisinopril 20 since it was a different generic that gave her heart palpitations. I have sent in a 90 day supply and she is scheduled to see Mickel Baas in October for CPE.

## 2021-05-11 ENCOUNTER — Ambulatory Visit (INDEPENDENT_AMBULATORY_CARE_PROVIDER_SITE_OTHER): Payer: Medicare PPO | Admitting: Family

## 2021-05-11 ENCOUNTER — Other Ambulatory Visit: Payer: Self-pay

## 2021-05-11 ENCOUNTER — Encounter: Payer: Self-pay | Admitting: Family

## 2021-05-11 VITALS — BP 180/92 | HR 52 | Temp 97.6°F | Resp 12 | Ht 64.5 in | Wt 152.2 lb

## 2021-05-11 DIAGNOSIS — E119 Type 2 diabetes mellitus without complications: Secondary | ICD-10-CM

## 2021-05-11 DIAGNOSIS — Z0000 Encounter for general adult medical examination without abnormal findings: Secondary | ICD-10-CM

## 2021-05-11 DIAGNOSIS — Z23 Encounter for immunization: Secondary | ICD-10-CM | POA: Diagnosis not present

## 2021-05-11 DIAGNOSIS — Z13220 Encounter for screening for lipoid disorders: Secondary | ICD-10-CM

## 2021-05-11 DIAGNOSIS — I10 Essential (primary) hypertension: Secondary | ICD-10-CM

## 2021-05-11 DIAGNOSIS — K219 Gastro-esophageal reflux disease without esophagitis: Secondary | ICD-10-CM

## 2021-05-11 DIAGNOSIS — E782 Mixed hyperlipidemia: Secondary | ICD-10-CM

## 2021-05-11 LAB — COMPREHENSIVE METABOLIC PANEL WITH GFR
ALT: 40 U/L — ABNORMAL HIGH (ref 0–35)
AST: 56 U/L — ABNORMAL HIGH (ref 0–37)
Albumin: 3.8 g/dL (ref 3.5–5.2)
Alkaline Phosphatase: 84 U/L (ref 39–117)
BUN: 12 mg/dL (ref 6–23)
CO2: 28 meq/L (ref 19–32)
Calcium: 10.6 mg/dL — ABNORMAL HIGH (ref 8.4–10.5)
Chloride: 105 meq/L (ref 96–112)
Creatinine, Ser: 0.73 mg/dL (ref 0.40–1.20)
GFR: 81.4 mL/min
Glucose, Bld: 142 mg/dL — ABNORMAL HIGH (ref 70–99)
Potassium: 4.7 meq/L (ref 3.5–5.1)
Sodium: 139 meq/L (ref 135–145)
Total Bilirubin: 1.5 mg/dL — ABNORMAL HIGH (ref 0.2–1.2)
Total Protein: 6.6 g/dL (ref 6.0–8.3)

## 2021-05-11 LAB — CBC WITH DIFFERENTIAL/PLATELET
Basophils Absolute: 0 K/uL (ref 0.0–0.1)
Basophils Relative: 0.4 % (ref 0.0–3.0)
Eosinophils Absolute: 0.2 K/uL (ref 0.0–0.7)
Eosinophils Relative: 2.5 % (ref 0.0–5.0)
HCT: 43.9 % (ref 36.0–46.0)
Hemoglobin: 14.5 g/dL (ref 12.0–15.0)
Lymphocytes Relative: 34.1 % (ref 12.0–46.0)
Lymphs Abs: 2.4 K/uL (ref 0.7–4.0)
MCHC: 32.9 g/dL (ref 30.0–36.0)
MCV: 92.3 fl (ref 78.0–100.0)
Monocytes Absolute: 0.5 K/uL (ref 0.1–1.0)
Monocytes Relative: 7.1 % (ref 3.0–12.0)
Neutro Abs: 3.9 K/uL (ref 1.4–7.7)
Neutrophils Relative %: 55.9 % (ref 43.0–77.0)
Platelets: 255 K/uL (ref 150.0–400.0)
RBC: 4.76 Mil/uL (ref 3.87–5.11)
RDW: 13.7 % (ref 11.5–15.5)
WBC: 7 K/uL (ref 4.0–10.5)

## 2021-05-11 LAB — LIPID PANEL
Cholesterol: 182 mg/dL (ref 0–200)
HDL: 48.3 mg/dL
LDL Cholesterol: 106 mg/dL — ABNORMAL HIGH (ref 0–99)
NonHDL: 133.45
Total CHOL/HDL Ratio: 4
Triglycerides: 136 mg/dL (ref 0.0–149.0)
VLDL: 27.2 mg/dL (ref 0.0–40.0)

## 2021-05-11 LAB — HEMOGLOBIN A1C: Hgb A1c MFr Bld: 6.6 % — ABNORMAL HIGH (ref 4.6–6.5)

## 2021-05-11 LAB — TSH: TSH: 0.01 u[IU]/mL — ABNORMAL LOW (ref 0.35–5.50)

## 2021-05-11 MED ORDER — LISINOPRIL 20 MG PO TABS: 20.0000 mg | ORAL_TABLET | Freq: Two times a day (BID) | ORAL | 1 refills | Status: DC

## 2021-05-11 MED ORDER — METOPROLOL TARTRATE 50 MG PO TABS: 50.0000 mg | ORAL_TABLET | Freq: Two times a day (BID) | ORAL | 3 refills | Status: DC

## 2021-05-11 MED ORDER — FAMOTIDINE 20 MG PO TABS: 20.0000 mg | ORAL_TABLET | Freq: Two times a day (BID) | ORAL | 3 refills | Status: DC

## 2021-05-11 MED ORDER — ALPRAZOLAM 0.5 MG PO TABS: 0.5000 mg | ORAL_TABLET | Freq: Every day | ORAL | 0 refills | Status: DC | PRN

## 2021-05-11 MED ORDER — CLONIDINE HCL 0.1 MG PO TABS: 0.1000 mg | ORAL_TABLET | Freq: Two times a day (BID) | ORAL | 3 refills | Status: DC

## 2021-05-11 MED ORDER — ESOMEPRAZOLE MAGNESIUM 40 MG PO CPDR: 40.0000 mg | DELAYED_RELEASE_CAPSULE | Freq: Every day | ORAL | 3 refills | Status: DC

## 2021-05-11 MED ORDER — EZETIMIBE 10 MG PO TABS: 10.0000 mg | ORAL_TABLET | Freq: Every day | ORAL | 3 refills | Status: DC

## 2021-05-11 NOTE — Progress Notes (Signed)
Courtney Russell is a 75 y.o. female with the following history as recorded in EpicCare:  Patient Active Problem List   Diagnosis Date Noted   Osteoporosis without current pathological fracture 09/05/2018   Hyperparathyroidism ([place]) 09/05/2018   Vitamin D deficiency 10/01/2016   Routine general medical examination at a health care facility 09/27/2015   Encounter for general adult medical examination with abnormal findings 09/27/2015   Urinary urgency 09/27/2015   Hypercalcemia 09/22/2013   Nonspecific abnormal electrocardiogram (ECG) (EKG) 09/18/2012   Irritable bowel disease 11/22/2010   NIPPLE DISCHARGE 08/29/2010   ANXIETY STATE, UNSPECIFIED 04/26/2010   GERD 04/26/2010   Type 2 diabetes mellitus ([place]) 04/21/2009   POSTMENOPAUSAL SYNDROME 04/21/2009   NONSPEC ELEVATION OF LEVELS OF TRANSAMINASE/LDH 04/21/2009   Hyperlipidemia 03/24/2007   Essential hypertension 03/24/2007    Current Outpatient Medications  Medication Sig Dispense Refill   aspirin 81 MG tablet Take 81 mg by mouth 1 dose over 46 hours.       cholecalciferol (VITAMIN D) 1000 units tablet Vitamin D     ALPRAZolam (XANAX) 0.5 MG tablet Take 1 tablet (0.5 mg total) by mouth daily as needed for anxiety. 30 tablet 0   cloNIDine (CATAPRES) 0.1 MG tablet Take 1 tablet (0.1 mg total) by mouth 2 (two) times daily. 180 tablet 3   esomeprazole (NEXIUM) 40 MG capsule Take 1 capsule (40 mg total) by mouth daily at 12 noon. 90 capsule 3   ezetimibe (ZETIA) 10 MG tablet Take 1 tablet (10 mg total) by mouth daily. 90 tablet 3   famotidine (PEPCID) 20 MG tablet Take 1 tablet (20 mg total) by mouth 2 (two) times daily. 180 tablet 3   lisinopril (ZESTRIL) 20 MG tablet Take 1 tablet (20 mg total) by mouth 2 (two) times daily. 180 tablet 1   metoprolol tartrate (LOPRESSOR) 50 MG tablet Take 1 tablet (50 mg total) by mouth 2 (two) times daily. 180 tablet 3   No current facility-administered medications for this visit.    Allergies:  Dexbromphen-acetaminophen, Fish oil, Codeine, Bisoprolol-hydrochlorothiazide, Crestor [rosuvastatin], Ezetimibe-simvastatin, Pravastatin, Hydrochlorothiazide, and Other  Past Medical History:  Diagnosis Date   GERD (gastroesophageal reflux disease)    High cholesterol    Hypertension    IBS (irritable bowel syndrome)    Palpitations    Prediabetes     Past Surgical History:  Procedure Laterality Date   ABDOMINAL HYSTERECTOMY  1992   fibroids   BILATERAL SALPINGOOPHORECTOMY  1996   cysts   BREAST SURGERY     duct excision   CHOLECYSTECTOMY     COLONOSCOPY  1998, 2008   negative;Dr Olevia Perches   HERNIA REPAIR     TONSILLECTOMY     TUBAL LIGATION      Family History  Problem Relation Age of Onset   Cancer Father        COLON & PROSTATE   Diabetes Father    Alzheimer's disease Father    Heart attack Father        MI in late 71s; CABG @ 5   Stroke Father        in 61s   Dementia Mother    Transient ischemic attack Mother        TIAs; AVM leaked in 70s   Colon polyps Mother    Coronary artery disease Sister        stent @ 26   Breast cancer Sister    Diabetes Paternal Aunt     Social History  Tobacco Use   Smoking status: Never   Smokeless tobacco: Never  Substance Use Topics   Alcohol use: No    Subjective:  Presents for yearly CPE;  Has noticed that her blood pressure has been up at home some recently- admits that stress level has been very high; Elevated readings most noticeable in the mornings; Denies any chest pain, shortness of breath, blurred vision or headache 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   HENT: Negative.    Eyes: Negative.   Respiratory: Negative.    Cardiovascular: Negative.   Gastrointestinal: Negative.   Genitourinary:  Negative.   Musculoskeletal:  Positive for joint pain.  Skin: Negative.   Neurological: Negative.   Endo/Heme/Allergies: Negative.   Psychiatric/Behavioral:  The patient is nervous/anxious.        Objective:  Vitals:   05/11/21 0826 05/11/21 0836 05/11/21 0851  BP: (!) 211/89 (!) 182/110 (!) 180/92  Pulse: (!) 52    Resp: 12    Temp: 97.6 F (36.4 C)    TempSrc: Oral    SpO2: 97%    Weight: 152 lb 3.2 oz (69 kg)    Height: 5' 4.5" (1.638 m)      General: Well developed, well nourished, in no acute distress  Skin : Warm and dry.  Head: Normocephalic and atraumatic  Eyes: Sclera and conjunctiva clear; pupils round and reactive to light; extraocular movements intact  Ears: External normal; canals clear; tympanic membranes normal  Oropharynx: Pink, supple. No suspicious lesions  Neck: Supple without thyromegaly, adenopathy  Lungs: Respirations unlabored; clear to auscultation bilaterally without wheeze, rales, rhonchi  CVS exam: normal rate and regular rhythm.  Abdomen: Soft; nontender; nondistended; normoactive bowel sounds; no masses or hepatosplenomegaly  Musculoskeletal: No deformities; no active joint inflammation  Extremities: No edema, cyanosis, clubbing  Vessels: Symmetric bilaterally  Neurologic: Alert and oriented; speech intact; face symmetrical; moves all extremities well; CNII-XII intact without focal deficit   Assessment:  1. PE (physical exam), annual   2. Essential hypertension   3. Need for pneumococcal vaccination   4. Lipid screening   5. Type 2 diabetes mellitus without complication, without long-term current use of insulin ([place])   6. Gastroesophageal reflux disease without esophagitis   7. Mixed hyperlipidemia   8. Serum calcium elevated     Plan:    Age appropriate preventive healthcare needs addressed; encouraged regular eye doctor and dental exams; encouraged regular exercise; will update labs and refills as needed today; follow-up to be  determined; Does have osteoporosis and has seen endocrine in the past about osteoporosis/ possible surgery for parathyroid treatment; she deferred surgery in the past but is agreeable to seeing endocrine;  Pneumovax 23 given; she plans to get flu shot at later date;  EKG shows sinus rhythm- no changes compared to previous EKGs from 2014/2015;  Return in 2 weeks; will try to coordinate visit on same day with endocrinology and sports medicine;  This visit occurred during the SARS-CoV-2 public health emergency.  Safety protocols were in place, including screening questions prior to the visit, additional usage of staff PPE, and extensive cleaning of exam room while observing appropriate contact time as indicated for disinfecting solutions.    No follow-ups on file.  Orders Placed This  Encounter  Procedures   Pneumococcal polysaccharide vaccine 23-valent greater than or equal to 2yo subcutaneous/IM   CBC with Differential/Platelet   Comp Met (CMET)   Lipid panel   TSH   Hemoglobin A1c   PTH, Intact and Calcium   EKG 12-Lead    Requested Prescriptions   Signed Prescriptions Disp Refills   lisinopril (ZESTRIL) 20 MG tablet 180 tablet 1    Sig: Take 1 tablet (20 mg total) by mouth 2 (two) times daily.   cloNIDine (CATAPRES) 0.1 MG tablet 180 tablet 3    Sig: Take 1 tablet (0.1 mg total) by mouth 2 (two) times daily.   esomeprazole (NEXIUM) 40 MG capsule 90 capsule 3    Sig: Take 1 capsule (40 mg total) by mouth daily at 12 noon.   ezetimibe (ZETIA) 10 MG tablet 90 tablet 3    Sig: Take 1 tablet (10 mg total) by mouth daily.   famotidine (PEPCID) 20 MG tablet 180 tablet 3    Sig: Take 1 tablet (20 mg total) by mouth 2 (two) times daily.   metoprolol tartrate (LOPRESSOR) 50 MG tablet 180 tablet 3    Sig: Take 1 tablet (50 mg total) by mouth 2 (two) times daily.   ALPRAZolam (XANAX) 0.5 MG tablet 30 tablet 0    Sig: Take 1 tablet (0.5 mg total) by mouth daily as needed for anxiety.

## 2021-05-12 LAB — PTH, INTACT AND CALCIUM
Calcium: 10.4 mg/dL (ref 8.6–10.4)
PTH: 145 pg/mL — ABNORMAL HIGH (ref 16–77)

## 2021-05-23 ENCOUNTER — Ambulatory Visit: Payer: Medicare PPO | Admitting: Internal Medicine

## 2021-05-23 ENCOUNTER — Ambulatory Visit: Payer: Medicare PPO | Admitting: Family

## 2021-05-23 ENCOUNTER — Other Ambulatory Visit: Payer: Self-pay

## 2021-05-23 ENCOUNTER — Encounter: Payer: Self-pay | Admitting: Family

## 2021-05-23 ENCOUNTER — Ambulatory Visit: Payer: Self-pay

## 2021-05-23 ENCOUNTER — Ambulatory Visit (INDEPENDENT_AMBULATORY_CARE_PROVIDER_SITE_OTHER): Payer: Medicare PPO | Admitting: Family Medicine

## 2021-05-23 ENCOUNTER — Encounter: Payer: Self-pay | Admitting: Internal Medicine

## 2021-05-23 ENCOUNTER — Encounter: Payer: Self-pay | Admitting: Family Medicine

## 2021-05-23 VITALS — BP 144/80 | Ht 64.5 in | Wt 152.0 lb

## 2021-05-23 VITALS — BP 146/90 | HR 86 | Ht 64.5 in | Wt 152.0 lb

## 2021-05-23 VITALS — BP 144/80 | HR 56 | Temp 98.1°F | Ht 64.4 in | Wt 152.0 lb

## 2021-05-23 DIAGNOSIS — M810 Age-related osteoporosis without current pathological fracture: Secondary | ICD-10-CM

## 2021-05-23 DIAGNOSIS — S76312A Strain of muscle, fascia and tendon of the posterior muscle group at thigh level, left thigh, initial encounter: Secondary | ICD-10-CM

## 2021-05-23 DIAGNOSIS — E0590 Thyrotoxicosis, unspecified without thyrotoxic crisis or storm: Secondary | ICD-10-CM | POA: Diagnosis not present

## 2021-05-23 DIAGNOSIS — M76899 Other specified enthesopathies of unspecified lower limb, excluding foot: Secondary | ICD-10-CM | POA: Insufficient documentation

## 2021-05-23 DIAGNOSIS — R7989 Other specified abnormal findings of blood chemistry: Secondary | ICD-10-CM

## 2021-05-23 DIAGNOSIS — I10 Essential (primary) hypertension: Secondary | ICD-10-CM

## 2021-05-23 DIAGNOSIS — E210 Primary hyperparathyroidism: Secondary | ICD-10-CM

## 2021-05-23 MED ORDER — DICLOFENAC SODIUM 1 % EX GEL: 4.0000 g | Freq: Four times a day (QID) | CUTANEOUS | 11 refills | Status: DC

## 2021-05-23 MED ORDER — ALENDRONATE SODIUM 70 MG PO TABS: 70.0000 mg | ORAL_TABLET | ORAL | 2 refills | Status: DC

## 2021-05-23 MED ORDER — METHIMAZOLE 5 MG PO TABS: 5.0000 mg | ORAL_TABLET | Freq: Two times a day (BID) | ORAL | 1 refills | Status: DC

## 2021-05-23 NOTE — Patient Instructions (Addendum)
-   Start Methimazole 5 mg 1 tablet twice a day  - Start Fosamax 70 mg once weekly

## 2021-05-23 NOTE — Patient Instructions (Signed)
Nice to meet you Please try heat  Please the wrap  Please try the exercises   Please send me a message in MyChart with any questions or updates.  Please see me back in 4 weeks.   --Dr. Raeford Razor

## 2021-05-23 NOTE — Progress Notes (Signed)
Courtney Russell is a 75 y.o. female with the following history as recorded in Epic[employer]:  Patient Active Problem List   Diagnosis Date Noted   Hamstring tendinitis at origin 05/23/2021   Osteoporosis without current pathological fracture 09/05/2018   Hyperparathyroidism ([place]) 09/05/2018   Vitamin D deficiency 10/01/2016   Routine general medical examination at a health care facility 09/27/2015   Encounter for general adult medical examination with abnormal findings 09/27/2015   Urinary urgency 09/27/2015   Hypercalcemia 09/22/2013   Nonspecific abnormal electrocardiogram (ECG) (EKG) 09/18/2012   Irritable bowel disease 11/22/2010   NIPPLE DISCHARGE 08/29/2010   ANXIETY STATE, UNSPECIFIED 04/26/2010   GERD 04/26/2010   Type 2 diabetes mellitus ([place]) 04/21/2009   POSTMENOPAUSAL SYNDROME 04/21/2009   NONSPEC ELEVATION OF LEVELS OF TRANSAMINASE/LDH 04/21/2009   Hyperlipidemia 03/24/2007   Essential hypertension 03/24/2007    Current Outpatient Medications  Medication Sig Dispense Refill   alendronate (FOSAMAX) 70 MG tablet Take 1 tablet (70 mg total) by mouth once a week. Take with a full glass of water on an empty stomach. 13 tablet 2   ALPRAZolam (XANAX) 0.5 MG tablet Take 1 tablet (0.5 mg total) by mouth daily as needed for anxiety. 30 tablet 0   aspirin 81 MG tablet Take 81 mg by mouth 1 dose over 46 hours.       cholecalciferol (VITAMIN D) 1000 units tablet Vitamin D     cloNIDine (CATAPRES) 0.1 MG tablet Take 1 tablet (0.1 mg total) by mouth 2 (two) times daily. 180 tablet 3   esomeprazole (NEXIUM) 40 MG capsule Take 1 capsule (40 mg total) by mouth daily at 12 noon. 90 capsule 3   ezetimibe (ZETIA) 10 MG tablet Take 1 tablet (10 mg total) by mouth daily. 90 tablet 3   lisinopril (ZESTRIL) 20 MG tablet Take 1 tablet (20 mg total) by mouth 2 (two) times daily. 180 tablet 1   methimazole (TAPAZOLE) 5 MG tablet Take 1 tablet (5 mg total) by mouth 2 (two) times daily. 180 tablet 1    metoprolol tartrate (LOPRESSOR) 50 MG tablet Take 1 tablet (50 mg total) by mouth 2 (two) times daily. 180 tablet 3   diclofenac Sodium (VOLTAREN) 1 % GEL Apply 4 g topically 4 (four) times daily. To affected joint. 100 g 11   No current facility-administered medications for this visit.    Allergies: Dexbromphen-acetaminophen, Fish oil, Codeine, Bisoprolol-hydrochlorothiazide, Crestor [rosuvastatin], Ezetimibe-simvastatin, Pravastatin, Hydrochlorothiazide, and Other  Past Medical History:  Diagnosis Date   GERD (gastroesophageal reflux disease)    High cholesterol    Hypertension    IBS (irritable bowel syndrome)    Palpitations    Prediabetes     Past Surgical History:  Procedure Laterality Date   ABDOMINAL HYSTERECTOMY  1992   fibroids   BILATERAL SALPINGOOPHORECTOMY  1996   cysts   BREAST SURGERY     duct excision   CHOLECYSTECTOMY     COLONOSCOPY  1998, 2008   negative;Dr Olevia Perches   HERNIA REPAIR     TONSILLECTOMY     TUBAL LIGATION      Family History  Problem Relation Age of Onset   Cancer Father        COLON & PROSTATE   Diabetes Father    Alzheimer's disease Father    Heart attack Father        MI in late 34s; CABG @ 62   Stroke Father        in 89s   Dementia  Mother    Transient ischemic attack Mother        TIAs; AVM leaked in 29s   Colon polyps Mother    Coronary artery disease Sister        stent @ 30   Breast cancer Sister    Diabetes Paternal Aunt     Social History   Tobacco Use   Smoking status: Never   Smokeless tobacco: Never  Substance Use Topics   Alcohol use: No    Subjective:  2 week follow up on blood pressure; feeling much better since making medication adjustments;  Did see endocrine earlier today and will be starting medication for hyperthyroidism and osteoporosis;  Will also try to see sports medicine as previously discussed;      Objective:  Vitals:   05/23/21 0943  BP: (!) 144/80  Pulse: (!) 56  Temp: 98.1 F (36.7 C)   TempSrc: Oral  SpO2: 95%  Weight: 152 lb (68.9 kg)  Height: 5' 4.4" (1.636 m)    General: Well developed, well nourished, in no acute distress  Skin : Warm and dry.  Head: Normocephalic and atraumatic  Lungs: Respirations unlabored; clear to auscultation bilaterally without wheeze, rales, rhonchi  CVS exam: normal rate and regular rhythm.  Neurologic: Alert and oriented; speech intact; face symmetrical; moves all extremities well; CNII-XII intact without focal deficit   Assessment:  1. Essential hypertension   2. Elevated LFTs     Plan:  Improved; refills updated- continue same medications; Plan to repeat LFTs in 6 weeks- to consider abdominal ultrasound is not normalized; ? If elevation related to underlying endocrine issues;   This visit occurred during the SARS-CoV-2 public health emergency.  Safety protocols were in place, including screening questions prior to the visit, additional usage of staff PPE, and extensive cleaning of exam room while observing appropriate contact time as indicated for disinfecting solutions.    Return in about 3 weeks (around 06/13/2021) for Lab appointment for endocrine and CMP for Courtney Russell's orders.  No orders of the defined types were placed in this encounter.   Requested Prescriptions    No prescriptions requested or ordered in this encounter

## 2021-05-23 NOTE — Assessment & Plan Note (Signed)
Likely related to her repeated bending over from moving the boxes. -Counseled on home exercise therapy and supportive care. -Counseled on compression -Voltaren gel. -Could consider nitro versus injection versus physical therapy.

## 2021-05-23 NOTE — Progress Notes (Signed)
Courtney Russell - 75 y.o. female MRN [id number]  Date of birth: 1946/07/25  SUBJECTIVE:  Including CC & ROS.  No chief complaint on file.   Courtney Russell is a 75 y.o. female that is presenting with right hamstring pain.  Pain has been present for 2 months.  Initially the pain originated after moving boxes.  No improvement with modalities today.    Review of Systems See HPI   HISTORY: Past Medical, Surgical, Social, and Family History Reviewed & Updated per EMR.   Pertinent Historical Findings include:  Past Medical History:  Diagnosis Date   GERD (gastroesophageal reflux disease)    High cholesterol    Hypertension    IBS (irritable bowel syndrome)    Palpitations    Prediabetes     Past Surgical History:  Procedure Laterality Date   ABDOMINAL HYSTERECTOMY  1992   fibroids   BILATERAL SALPINGOOPHORECTOMY  1996   cysts   BREAST SURGERY     duct excision   CHOLECYSTECTOMY     COLONOSCOPY  1998, 2008   negative;Dr Olevia Perches   HERNIA REPAIR     TONSILLECTOMY     TUBAL LIGATION      Family History  Problem Relation Age of Onset   Cancer Father        COLON & PROSTATE   Diabetes Father    Alzheimer's disease Father    Heart attack Father        MI in late 51s; CABG @ 101   Stroke Father        in 46s   Dementia Mother    Transient ischemic attack Mother        TIAs; AVM leaked in 65s   Colon polyps Mother    Coronary artery disease Sister        stent @ 63   Breast cancer Sister    Diabetes Paternal Aunt     Social History   Socioeconomic History   Marital status: Married    Spouse name: Not on file   Number of children: 3   Years of education: 13   Highest education level: Not on file  Occupational History   Occupation: Retired  Tobacco Use   Smoking status: Never   Smokeless tobacco: Never  Substance and Sexual Activity   Alcohol use: No   Drug use: No   Sexual activity: Not on file  Other Topics Concern   Not on file  Social History  Narrative   Fun: Travel and read   Denies abuse and feels safe at home.    Social Determinants of Health   Financial Resource Strain: Not on file  Food Insecurity: Not on file  Transportation Needs: Not on file  Physical Activity: Not on file  Stress: Not on file  Social Connections: Not on file  Intimate Partner Violence: Not on file     PHYSICAL EXAM:  VS: BP (!) 144/80   Ht 5' 4.5" (1.638 m)   Wt 152 lb (68.9 kg)   BMI 25.69 kg/m  Physical Exam Gen: NAD, alert, cooperative with exam, well-appearing   Limited ultrasound: Right hamstring:  Irregularity at the ischial tuberosity with thickening of the origin of the hamstring and hyperemia  Summary: Tendinosis at the origin of the hamstring.  Ultrasound and interpretation by Clearance Coots, MD   ASSESSMENT & PLAN:   Hamstring tendinitis at origin Likely related to her repeated bending over from moving the boxes. -Counseled on home exercise therapy and supportive  care. -Counseled on compression -Voltaren gel. -Could consider nitro versus injection versus physical therapy.

## 2021-05-23 NOTE — Progress Notes (Signed)
Name: Courtney Russell  MRN/ DOB: [id number], 22-May-1946    Age/ Sex: 75 y.o., female     PCP: Marrian Salvage, [place]   Reason for Endocrinology Evaluation:  Primary hyperparathyroidism     Initial Endocrinology Clinic Visit:  06/05/2018    PATIENT IDENTIFIER: Courtney Russell is a 75 y.o., female with a past medical history of hypertension and dyslipidemia. She has followed with [facility] since 06/05/2018 for consultative assistance with management of her hyperparathyroidism  HISTORICAL SUMMARY:  Courtney Russell has been diagnosed with hypercalcemia approximately in 2009, she has been asymptomatic all these years.In review of her records she has had intermittent hypercalcemia since 2015 with a serum calcium of 10.7 mg/dL (corrected 10.86) She denies any history of HCTZ or lithium use    24-hr urinary excretion of calcium 249.2 in 05/2018,calcium creatinine ratio of 0.029 which is consistent with primary hyperparathyroidism    DXA - Osteoporosis 06/2018 started on Alendronate    She was lost to follow up from 08/2018 until her return in 04/2021   Repeat DXA 12/2020 showed worsening fistal 1/3rd of forearm but improvement in the hip area  No family history of hyperparathyroidism, but both parents have history of kidney stones  THYROID HISTORY:  She was noted with a suppressed TSH at 0.01 uIU/mL during routine labs in 04/2021  SUBJECTIVE:    Today (05/23/2021):  Courtney Russell is here for a follow-up on hyperparathyroidism. She has not been to our clinic in 32 months.    She has also had her DEXA since her last visit, with worsening DXA scan at the forearm   She was also noted with a suppressed TSH  She never started  the alendronate     She has been noted with weight loss  Has IBS with alternating bowel movement  Has chronic palpitations  Denies hand tremors Denies local neck symptoms  She has chronic GERD, on nexium   NO Biotin    Today she denies any symptoms of polyuria polydipsia, no renal stones since her last visit,    She not on OTC calcium tablets  She is on Vitamin D 5000 iu daily    No FH of thyroid disease   HISTORY:  Past Medical History:  Past Medical History:  Diagnosis Date  . GERD (gastroesophageal reflux disease)   . High cholesterol   . Hypertension   . IBS (irritable bowel syndrome)   . Palpitations   . Prediabetes    Past Surgical History:  Past Surgical History:  Procedure Laterality Date  . ABDOMINAL HYSTERECTOMY  1992   fibroids  . BILATERAL SALPINGOOPHORECTOMY  1996   cysts  . BREAST SURGERY     duct excision  . CHOLECYSTECTOMY    . COLONOSCOPY  1998, 2008   negative;Dr Olevia Perches  . HERNIA REPAIR    . TONSILLECTOMY    . TUBAL LIGATION     Social History:  reports that she has never smoked. She has never used smokeless tobacco. She reports that she does not drink alcohol and does not use drugs. Family History: family history includes Alzheimer's disease in her father; Breast cancer in her sister; Cancer in her father; Colon polyps in her mother; Coronary artery disease in her sister; Dementia in her mother; Diabetes in her father and paternal aunt; Heart attack in her father; Stroke in her father; Transient ischemic attack in her mother.   HOME MEDICATIONS: Allergies as of 05/23/2021  Reactions   Dexbromphen-acetaminophen Other (See Comments)   Tachycardia, and leg numbness Tachycardia, and leg numbness   Fish Oil    REACTION: rash REACTION: rash   Codeine Other (See Comments)   Mental status changes Mental status changes   Bisoprolol-hydrochlorothiazide Other (See Comments)   Dropped potassium level,hypokalemia Dropped potassium level,hypokalemia   Crestor [rosuvastatin]    Elevated LFTs   Ezetimibe-simvastatin Other (See Comments)   Elevated LFTs Makes liver enzymes too high Elevated LFTs Makes liver enzymes too high   Pravastatin    palpitations     Hydrochlorothiazide    REACTION: low potassium level REACTION: low potassium level   Other Palpitations   dryx oil dryx oil        Medication List        Accurate as of May 23, 2021  9:19 AM. If you have any questions, ask your nurse or doctor.          STOP taking these medications    famotidine 20 MG tablet Commonly known as: Pepcid Stopped by: Dorita Sciara, MD       TAKE these medications    alendronate 70 MG tablet Commonly known as: Fosamax Take 1 tablet (70 mg total) by mouth once a week. Take with a full glass of water on an empty stomach. Started by: Dorita Sciara, MD   ALPRAZolam 0.5 MG tablet Commonly known as: Xanax Take 1 tablet (0.5 mg total) by mouth daily as needed for anxiety.   aspirin 81 MG tablet Take 81 mg by mouth 1 dose over 46 hours.   cholecalciferol 1000 units tablet Commonly known as: VITAMIN D Vitamin D   cloNIDine 0.1 MG tablet Commonly known as: CATAPRES Take 1 tablet (0.1 mg total) by mouth 2 (two) times daily.   esomeprazole 40 MG capsule Commonly known as: NEXIUM Take 1 capsule (40 mg total) by mouth daily at 12 noon.   ezetimibe 10 MG tablet Commonly known as: Zetia Take 1 tablet (10 mg total) by mouth daily.   lisinopril 20 MG tablet Commonly known as: ZESTRIL Take 1 tablet (20 mg total) by mouth 2 (two) times daily.   methimazole 5 MG tablet Commonly known as: TAPAZOLE Take 1 tablet (5 mg total) by mouth 2 (two) times daily. Started by: Dorita Sciara, MD   metoprolol tartrate 50 MG tablet Commonly known as: LOPRESSOR Take 1 tablet (50 mg total) by mouth 2 (two) times daily.          OBJECTIVE:   PHYSICAL EXAM: VS: BP (!) 146/90 (BP Location: Left Arm, Patient Position: Sitting, Cuff Size: Small)   Pulse 86   Ht 5' 4.5" (1.638 m)   Wt 152 lb (68.9 kg)   SpO2 96%   BMI 25.69 kg/m    EXAM: General: Pt appears well and is in NAD Thyroid : NO nodules appreciated   Lungs:  Clear with good BS bilat with no rales, rhonchi, or wheezes  Heart: Auscultation: RRR.  Abdomen: Normoactive bowel sounds, soft, nontender, without masses or organomegaly palpable  Extremities:  BL LE: No pretibial edema normal ROM and strength.  Mental Status: Judgment, insight: Intact Memory: Intact for recent and remote events Mood and affect: No depression, anxiety, or agitation     DATA REVIEWED: Results for JALEIAH, ASAY" (MRN [id number]) as of 09/07/2018 16:19  Ref. Range 09/05/2018 10:43  Sodium Latest Ref Range: 135 - 145 mEq/L 140  Potassium Latest Ref Range: 3.5 - 5.1 mEq/L  4.7  Chloride Latest Ref Range: 96 - 112 mEq/L 107  CO2 Latest Ref Range: 19 - 32 mEq/L 26  Glucose Latest Ref Range: 70 - 99 mg/dL 112 (H)  BUN Latest Ref Range: 6 - 23 mg/dL 11  Creatinine Latest Ref Range: 0.40 - 1.20 mg/dL 0.86  Calcium Latest Ref Range: 8.4 - 10.5 mg/dL 10.6 (H)  Albumin Latest Ref Range: 3.5 - 5.2 g/dL 3.8  GFR Latest Ref Range: >60.00 mL/min 65.03   PTH-pending   DXA 6/14/20122  01/10/2021 Change 2019  AP spine -1.8 Down 5.0%  RFN -1.0   Right total hip -1.5   LFN  -2.1   Left total hip -1.8 Up 3.0%   Left 1/3rd radium  -3.9 Down 10.0%    ASSESSMENT / PLAN / RECOMMENDATIONS:   Hypercalcemia secondary to primary hyperparathyroidism:  -Patient is asymptomatic  -She does qualify for surgical parathyroidectomy due to osteoporosis, patient declined surgical intervention  - Serum calcium has been stable    Encourage hydration Avoid over-the-counter calcium and any calcium containing products such as Tums Consume 2-3 servings of dietary calcium on daily basis     2. Osteoporosis :   - It is difficult to asceratin how much of this, is postmenopausal process vs primary hyperparathyroidism, at any point, having primary hyperparathyroidism does not help the process. Once parathyroidectomy is performed, it usually takes 6 months before any improvement is  seen on DXA.   - I have attempted to prescribe Fosamax in 2020 but she never started it , husband has a negative experience with it.  - we have discussed GI complications - Patient advised that if she develops any heartburn symptoms to use antihistamines or PPIs, if symptoms are severe enough, will consider IV bisphosphonate.   Medication  Start Alendronate 70 mg once a week    3. Subclinical Hyperthyroidism :   The causes of subclinical hyperthyroidism are the same as the causes of overt hyperthyroidism, and like overt hyperthyroidism, subclinical hyperthyroidism can be persistent or transient. Common causes of subclinical hyperthyroidism include autonomously functioning thyroid adenomas and multinodular goiters, or Graves' disease    Most patients with subclinical hyperthyroidism have no clinical manifestations of hyperthyroidism, and those symptoms that are present (eg, tachycardia, tremor, dyspnea on exertion, weight loss) are mild and nonspecific." However, subclinical hyperthyroidism is associated with an increased risk of atrial fibrillation and, primarily in postmenopausal women, a decrease in bone mineral density.  - We have opted opted to treat based on age and osteoporosis  -We discussed that Graves' Disease is a result of an autoimmune condition involving the thyroid.    - We discussed with pt the benefits of methimazole in the Tx of hyperthyroidism, as well as the possible side effects/complications of anti-thyroid drug Tx (specifically detailing the rare, but serious side effect of agranulocytosis). She was informed of need for regular thyroid function monitoring while on methimazole to ensure appropriate dosage without over-treatment. As well, we discussed the possible side effects of methimazole including the chance of rash, the small chance of liver irritation/juandice and the <=1 in 300-400 chance of sudden onset agranulocytosis.  We discussed importance of going to ED promptly  (and stopping methimazole) if shewere to develop significant fever with severe sore throat of other evidence of acute infection.      We extensively discussed the various treatment options for hyperthyroidism and Graves disease including ablation therapy with radioactive iodine versus antithyroid drug treatment versus surgical therapy.  We recommended to the patient  that we felt, at this time, that thionamide therapy would be most optimal.    Will check TRAB and thyroid ultrasound     Medication  Methimazole 5 mg BID    Follow-up in 3 months Labs in 6 weeks    Signed electronically by: Mack Guise, MD   [facility] Endocrinology  [facility] [address] [place], [place] [address] Phone: [phone] FAX: [phone]   CC: Lance Sell, NP [place] [address] Phone: [phone] Fax: [phone]

## 2021-05-23 NOTE — Patient Instructions (Addendum)
Sports medicine is [address];

## 2021-05-31 ENCOUNTER — Other Ambulatory Visit (HOSPITAL_BASED_OUTPATIENT_CLINIC_OR_DEPARTMENT_OTHER): Payer: Self-pay

## 2021-05-31 ENCOUNTER — Ambulatory Visit (HOSPITAL_BASED_OUTPATIENT_CLINIC_OR_DEPARTMENT_OTHER)
Admission: RE | Admit: 2021-05-31 | Discharge: 2021-05-31 | Disposition: A | Discharge status: Home / Self Care | Payer: Medicare PPO | Source: Ambulatory Visit | Attending: Internal Medicine | Admitting: Internal Medicine

## 2021-05-31 ENCOUNTER — Other Ambulatory Visit: Payer: Self-pay

## 2021-05-31 DIAGNOSIS — E0590 Thyrotoxicosis, unspecified without thyrotoxic crisis or storm: Secondary | ICD-10-CM | POA: Insufficient documentation

## 2021-06-13 ENCOUNTER — Other Ambulatory Visit: Payer: Medicare PPO

## 2021-06-19 ENCOUNTER — Telehealth: Payer: Self-pay | Admitting: Internal Medicine

## 2021-06-19 NOTE — Telephone Encounter (Signed)
Patient states that she has been taking her medication as prescribed.   183/97 a few days ago    06/19/2021  2:15pm 151/82 Left  2:16pm-168-88 Right   2:00 pm 149/80 left   2.00pm -161/76 Right   12- 150/74 Left    12:30pm-160/79 left  Please advise

## 2021-06-19 NOTE — Telephone Encounter (Signed)
PT called concerning High/Low BP. PT has an appt scheduled for 06/29/21 but want to speak with someone concerning this issue.  Please call [phone]

## 2021-06-20 ENCOUNTER — Other Ambulatory Visit: Payer: Self-pay

## 2021-06-20 ENCOUNTER — Ambulatory Visit: Payer: Medicare PPO | Admitting: Family

## 2021-06-20 ENCOUNTER — Encounter: Payer: Self-pay | Admitting: Family

## 2021-06-20 VITALS — BP 181/94 | HR 47 | Temp 98.0°F | Ht 64.5 in | Wt 151.8 lb

## 2021-06-20 DIAGNOSIS — E0590 Thyrotoxicosis, unspecified without thyrotoxic crisis or storm: Secondary | ICD-10-CM

## 2021-06-20 DIAGNOSIS — I10 Essential (primary) hypertension: Secondary | ICD-10-CM

## 2021-06-20 DIAGNOSIS — R9431 Abnormal electrocardiogram [ECG] [EKG]: Secondary | ICD-10-CM | POA: Diagnosis not present

## 2021-06-20 LAB — CBC WITH DIFFERENTIAL/PLATELET
Basophils Absolute: 0.1 10*3/uL (ref 0.0–0.1)
Basophils Relative: 0.8 % (ref 0.0–3.0)
Eosinophils Absolute: 0.2 10*3/uL (ref 0.0–0.7)
Eosinophils Relative: 2.6 % (ref 0.0–5.0)
HCT: 44.3 % (ref 36.0–46.0)
Hemoglobin: 14.7 g/dL (ref 12.0–15.0)
Lymphocytes Relative: 32.8 % (ref 12.0–46.0)
Lymphs Abs: 2.1 10*3/uL (ref 0.7–4.0)
MCHC: 33.2 g/dL (ref 30.0–36.0)
MCV: 93.8 fl (ref 78.0–100.0)
Monocytes Absolute: 0.5 10*3/uL (ref 0.1–1.0)
Monocytes Relative: 8.2 % (ref 3.0–12.0)
Neutro Abs: 3.6 10*3/uL (ref 1.4–7.7)
Neutrophils Relative %: 55.6 % (ref 43.0–77.0)
Platelets: 250 10*3/uL (ref 150.0–400.0)
RBC: 4.72 Mil/uL (ref 3.87–5.11)
RDW: 13.6 % (ref 11.5–15.5)
WBC: 6.4 10*3/uL (ref 4.0–10.5)

## 2021-06-20 LAB — COMPREHENSIVE METABOLIC PANEL
ALT: 29 U/L (ref 0–35)
AST: 38 U/L — ABNORMAL HIGH (ref 0–37)
Albumin: 3.7 g/dL (ref 3.5–5.2)
Alkaline Phosphatase: 84 U/L (ref 39–117)
BUN: 9 mg/dL (ref 6–23)
CO2: 29 mEq/L (ref 19–32)
Calcium: 10 mg/dL (ref 8.4–10.5)
Chloride: 105 mEq/L (ref 96–112)
Creatinine, Ser: 0.87 mg/dL (ref 0.40–1.20)
GFR: 65.9 mL/min (ref >60.00)
Glucose, Bld: 161 mg/dL — ABNORMAL HIGH (ref 70–99)
Potassium: 4.1 mEq/L (ref 3.5–5.1)
Sodium: 139 mEq/L (ref 135–145)
Total Bilirubin: 1.1 mg/dL (ref 0.2–1.2)
Total Protein: 6.5 g/dL (ref 6.0–8.3)

## 2021-06-20 MED ORDER — AMLODIPINE BESYLATE 5 MG PO TABS: 5.0000 mg | ORAL_TABLET | Freq: Every day | ORAL | 0 refills | Status: DC

## 2021-06-20 NOTE — Telephone Encounter (Signed)
Patient notified and will contact pcp to schedule appt

## 2021-06-20 NOTE — Progress Notes (Signed)
Courtney Russell is a 75 y.o. female with the following history as recorded in [employer]:  Patient Active Problem List   Diagnosis Date Noted   Hamstring tendinitis at origin 05/23/2021   Osteoporosis without current pathological fracture 09/05/2018   Hyperparathyroidism ([place]) 09/05/2018   Vitamin D deficiency 10/01/2016   Routine general medical examination at a health care facility 09/27/2015   Encounter for general adult medical examination with abnormal findings 09/27/2015   Urinary urgency 09/27/2015   Hypercalcemia 09/22/2013   Nonspecific abnormal electrocardiogram (ECG) (EKG) 09/18/2012   Irritable bowel disease 11/22/2010   NIPPLE DISCHARGE 08/29/2010   ANXIETY STATE, UNSPECIFIED 04/26/2010   GERD 04/26/2010   Type 2 diabetes mellitus ([place]) 04/21/2009   POSTMENOPAUSAL SYNDROME 04/21/2009   NONSPEC ELEVATION OF LEVELS OF TRANSAMINASE/LDH 04/21/2009   Hyperlipidemia 03/24/2007   Essential hypertension 03/24/2007    Current Outpatient Medications  Medication Sig Dispense Refill   ALPRAZolam (XANAX) 0.5 MG tablet Take 1 tablet (0.5 mg total) by mouth daily as needed for anxiety. 30 tablet 0   amLODipine (NORVASC) 5 MG tablet Take 1 tablet (5 mg total) by mouth daily. 90 tablet 0   aspirin 81 MG tablet Take 81 mg by mouth 1 dose over 46 hours.       cholecalciferol (VITAMIN D) 1000 units tablet Vitamin D     cloNIDine (CATAPRES) 0.1 MG tablet Take 1 tablet (0.1 mg total) by mouth 2 (two) times daily. 180 tablet 3   diclofenac Sodium (VOLTAREN) 1 % GEL Apply 4 g topically 4 (four) times daily. To affected joint. 100 g 11   esomeprazole (NEXIUM) 40 MG capsule Take 1 capsule (40 mg total) by mouth daily at 12 noon. 90 capsule 3   ezetimibe (ZETIA) 10 MG tablet Take 1 tablet (10 mg total) by mouth daily. 90 tablet 3   lisinopril (ZESTRIL) 20 MG tablet Take 1 tablet (20 mg total) by mouth 2 (two) times daily. 180 tablet 1   methimazole (TAPAZOLE) 5 MG tablet Take 1 tablet (5 mg  total) by mouth 2 (two) times daily. 180 tablet 1   metoprolol tartrate (LOPRESSOR) 50 MG tablet Take 1 tablet (50 mg total) by mouth 2 (two) times daily. 180 tablet 3   alendronate (FOSAMAX) 70 MG tablet Take 1 tablet (70 mg total) by mouth once a week. Take with a full glass of water on an empty stomach. (Patient not taking: Reported on 06/20/2021) 13 tablet 2   No current facility-administered medications for this visit.    Allergies: Dexbromphen-acetaminophen, Fish oil, Codeine, Bisoprolol-hydrochlorothiazide, Crestor [rosuvastatin], Ezetimibe-simvastatin, Pravastatin, Hydrochlorothiazide, and Other  Past Medical History:  Diagnosis Date   GERD (gastroesophageal reflux disease)    High cholesterol    Hypertension    IBS (irritable bowel syndrome)    Palpitations    Prediabetes     Past Surgical History:  Procedure Laterality Date   ABDOMINAL HYSTERECTOMY  1992   fibroids   BILATERAL SALPINGOOPHORECTOMY  1996   cysts   BREAST SURGERY     duct excision   CHOLECYSTECTOMY     COLONOSCOPY  1998, 2008   negative;Dr Olevia Perches   HERNIA REPAIR     TONSILLECTOMY     TUBAL LIGATION      Family History  Problem Relation Age of Onset   Cancer Father        COLON & PROSTATE   Diabetes Father    Alzheimer's disease Father    Heart attack Father  MI in late 73s; CABG @ 40   Stroke Father        in 68s   Dementia Mother    Transient ischemic attack Mother        TIAs; AVM leaked in 72s   Colon polyps Mother    Coronary artery disease Sister        stent @ 22   Breast cancer Sister    Diabetes Paternal Aunt     Social History   Tobacco Use   Smoking status: Never   Smokeless tobacco: Never  Substance Use Topics   Alcohol use: No    Subjective:   Patient presents with concerns that blood pressure is not controlled; Manual reading in office today is 180/90 and patient's blood pressure cuff reading 181/94; notes that she has been feeling more tired/ has had increased  headache; no chest pain or shortness of breath; is currently working with endocrine to get control of hyperthyroidism;    Objective:  Vitals:   06/20/21 1035 06/20/21 1036  BP: (!) 148/90 (!) 181/94  Pulse: (!) 47   Temp: 98 F (36.7 C)   TempSrc: Oral   SpO2: 96%   Weight: 151 lb 12.8 oz (68.9 kg)   Height: 5' 4.5" (1.638 m)     General: Well developed, well nourished, in no acute distress  Skin : Warm and dry.  Head: Normocephalic and atraumatic  Eyes: Sclera and conjunctiva clear; pupils round and reactive to light; extraocular movements intact  Ears: External normal; canals clear; tympanic membranes normal  Oropharynx: Pink, supple. No suspicious lesions  Neck: Supple without thyromegaly, adenopathy  Lungs: Respirations unlabored; clear to auscultation bilaterally without wheeze, rales, rhonchi  CVS exam: normal rate and regular rhythm.  Neurologic: Alert and oriented; speech intact; face symmetrical; moves all extremities well; CNII-XII intact without focal deficit   Assessment:  1. Primary hypertension   2. Abnormal EKG   3. Hyperthyroidism     Plan:  Add Amlodipine 5 mg- patient wants to try taking this mid-day and continue her other medications in the am and pm; ? If blood pressure spikes are related to underlying thyroid disease; Reviewed EKG with patient- sinus rhythm but no changes noted compared to readings from 2014/2015; recommend cardiology referral and patient in agreement; refer back to Dr. Gwenlyn Found whom she has seen in the past; Check labs today- will forward to endocrine for further review;   This visit occurred during the SARS-CoV-2 public health emergency.  Safety protocols were in place, including screening questions prior to the visit, additional usage of staff PPE, and extensive cleaning of exam room while observing appropriate contact time as indicated for disinfecting solutions.    No follow-ups on file.  Orders Placed This Encounter  Procedures   CBC  with Differential/Platelet   Comp Met (CMET)   Thyroid Panel With TSH   Ambulatory referral to Cardiology    Referral Priority:   Routine    Referral Type:   Consultation    Referral Reason:   Specialty Services Required    Referred to Provider:   Lorretta Harp, MD    Requested Specialty:   Cardiology    Number of Visits Requested:   1   EKG 12-Lead    Requested Prescriptions   Signed Prescriptions Disp Refills   amLODipine (NORVASC) 5 MG tablet 90 tablet 0    Sig: Take 1 tablet (5 mg total) by mouth daily.

## 2021-06-21 LAB — THYROID PANEL WITH TSH
Free Thyroxine Index: 1.9 (ref 1.4–3.8)
T3 Uptake: 23 % (ref 22–35)
T4, Total: 8.4 ug/dL (ref 5.1–11.9)
TSH: 0.09 mIU/L — ABNORMAL LOW (ref 0.40–4.50)

## 2021-06-26 ENCOUNTER — Telehealth: Payer: Self-pay | Admitting: *Deleted

## 2021-06-26 NOTE — Telephone Encounter (Signed)
Spoke with patient about lab results and she wanted to let you know that she has not started the new bp med Norvasc.  Her bp levels has been doing great.    Some of the readings from new to oldest:   139/76 128/69 125/75 148/73 144/75 133/69  Please advise on if she should still start or not?

## 2021-06-27 ENCOUNTER — Telehealth: Payer: Self-pay | Admitting: Internal Medicine

## 2021-06-27 ENCOUNTER — Other Ambulatory Visit: Payer: Self-pay | Admitting: Internal Medicine

## 2021-06-27 MED ORDER — METHIMAZOLE 5 MG PO TABS: 5.0000 mg | ORAL_TABLET | Freq: Three times a day (TID) | ORAL | 1 refills | Status: DC

## 2021-06-27 NOTE — Telephone Encounter (Signed)
-----   Message from Doylene Canning, [place] sent at 06/26/2021  1:58 PM EST ----- Patient notified of lab work and labs forwarded to Dr. Kelton Pillar.

## 2021-06-27 NOTE — Telephone Encounter (Signed)
Patient advised and verbalized understanding 

## 2021-06-27 NOTE — Telephone Encounter (Signed)
I have called the pt and she stated that she is feeling better. She feels like she has more energy and and would like to thank you for keeping her informed. She stated understanding to the instructions below.

## 2021-06-27 NOTE — Telephone Encounter (Signed)
Please let the pt know that her PCP contacted me regarding abnormal thyroid tests     She is currently on Methimazole 5 mg TWO tablets a day , She needs to INCREASE to THREE tablets a day    She can take them together or take 2 in the morning and 1 in the evening     Thanks

## 2021-06-29 ENCOUNTER — Other Ambulatory Visit (INDEPENDENT_AMBULATORY_CARE_PROVIDER_SITE_OTHER): Payer: Medicare PPO

## 2021-06-29 ENCOUNTER — Ambulatory Visit: Payer: Medicare PPO | Admitting: Family Medicine

## 2021-06-29 DIAGNOSIS — E0590 Thyrotoxicosis, unspecified without thyrotoxic crisis or storm: Secondary | ICD-10-CM | POA: Diagnosis not present

## 2021-06-29 DIAGNOSIS — E210 Primary hyperparathyroidism: Secondary | ICD-10-CM

## 2021-06-29 LAB — VITAMIN D 25 HYDROXY (VIT D DEFICIENCY, FRACTURES): VITD: 25.54 ng/mL — ABNORMAL LOW (ref 30.00–100.00)

## 2021-06-29 LAB — T4, FREE: Free T4: 0.53 ng/dL — ABNORMAL LOW (ref 0.60–1.60)

## 2021-06-29 LAB — TSH: TSH: 0.25 u[IU]/mL — ABNORMAL LOW (ref 0.35–5.50)

## 2021-07-01 LAB — TRAB (TSH RECEPTOR BINDING ANTIBODY): TRAB: 3.21 IU/L — ABNORMAL HIGH (ref <=2.00)

## 2021-07-10 ENCOUNTER — Telehealth: Payer: Self-pay | Admitting: Internal Medicine

## 2021-07-10 NOTE — Telephone Encounter (Signed)
Pt is trying to go over labs, she says she cannot get in contact with front office at Dr. Kelton Pillar office. Please advise

## 2021-07-11 NOTE — Telephone Encounter (Signed)
Mychart message sent to patient to increase Vitamin D

## 2021-08-02 ENCOUNTER — Ambulatory Visit: Payer: Medicare PPO | Admitting: Cardiovascular Disease

## 2021-08-02 ENCOUNTER — Encounter: Payer: Self-pay | Admitting: Cardiovascular Disease

## 2021-08-02 ENCOUNTER — Other Ambulatory Visit: Payer: Self-pay

## 2021-08-02 DIAGNOSIS — I10 Essential (primary) hypertension: Secondary | ICD-10-CM | POA: Diagnosis not present

## 2021-08-02 DIAGNOSIS — E782 Mixed hyperlipidemia: Secondary | ICD-10-CM

## 2021-08-02 DIAGNOSIS — R9431 Abnormal electrocardiogram [ECG] [EKG]: Secondary | ICD-10-CM | POA: Diagnosis not present

## 2021-08-02 NOTE — Patient Instructions (Signed)

## 2021-08-02 NOTE — Assessment & Plan Note (Signed)
History of an abnormal EKG with LVH voltage and lateral T wave inversion.  When compared to an EKG performed many years ago there has been no change.

## 2021-08-02 NOTE — Progress Notes (Signed)
08/02/2021 Courtney Russell   Mar 27, 1946  [id number]  Primary Physician Courtney Russell, [place] Primary Cardiologist: Lorretta Harp MD Courtney Russell, [place]  HPI:  Courtney Russell is a 76 y.o. mildly overweight married Caucasian female mother of 3 children, grandmother of 2 grandchildren who was referred by Courtney Mourning  FNP for evaluation of hypertension.  I apparently saw her many years ago.  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  I apparently took care of her father Courtney Russell as well as her Sister Courtney Russell both of whom had ischemic heart disease.  She has a history of hypertension and hyperlipidemia intolerant to statin therapy.  She is never had a heart attack or stroke.  She apparently takes care of her disabled husband.  She denies chest pain or shortness of breath.  She was recently noted to have elevated blood pressure and was separately diagnosed with thyroid and parathyroid disease both of which were treated resulting in marked improvement in her blood pressure control.   Current Meds  Medication Sig   aspirin 81 MG tablet Take 81 mg by mouth 1 dose over 46 hours.     cholecalciferol (VITAMIN D) 1000 units tablet Vitamin D   cloNIDine (CATAPRES) 0.1 MG tablet Take 1 tablet (0.1 mg total) by mouth 2 (two) times daily.   diclofenac Sodium (VOLTAREN) 1 % GEL Apply 4 g topically 4 (four) times daily. To affected joint.   ezetimibe (ZETIA) 10 MG tablet Take 1 tablet (10 mg total) by mouth daily.   lisinopril (ZESTRIL) 20 MG tablet Take 1 tablet (20 mg total) by mouth 2 (two) times daily.   metoprolol tartrate (LOPRESSOR) 50 MG tablet Take 1 tablet (50 mg total) by mouth 2 (two) times daily.   vitamin C (ASCORBIC ACID) 500 MG tablet Take 500 mg by mouth daily.     Allergies  Allergen Reactions   Dexbromphen-Acetaminophen Other (See Comments)    Tachycardia,  and leg numbness Tachycardia, and leg numbness    Fish Oil     REACTION: rash REACTION: rash   Codeine Other (See Comments)    Mental status changes Mental status changes    Bisoprolol-Hydrochlorothiazide Other (See Comments)    Dropped potassium level,hypokalemia Dropped potassium level,hypokalemia    Crestor [Rosuvastatin]     Elevated LFTs   Ezetimibe-Simvastatin Other (See Comments)    Elevated LFTs Makes liver enzymes too high Elevated LFTs Makes liver enzymes too high    Pravastatin     palpitations     Hydrochlorothiazide     REACTION: low potassium level REACTION: low potassium level   Other Palpitations    dryx oil dryx oil     Social History   Socioeconomic History   Marital status: Married    Spouse name: Not on file   Number of children: 3   Years of education: 13   Highest education level: Not on file  Occupational History   Occupation: Retired  Tobacco Use   Smoking status: Never   Smokeless tobacco: Never  Substance and Sexual Activity   Alcohol use: No   Drug use: No   Sexual activity: Not on file  Other Topics Concern   Not on file  Social History Narrative   Fun: Travel and read   Denies abuse and feels safe at home.    Social Determinants of Health  Financial Resource Strain: Not on file  Food Insecurity: Not on file  Transportation Needs: Not on file  Physical Activity: Not on file  Stress: Not on file  Social Connections: Not on file  Intimate Partner Violence: Not on file     Review of Systems: General: negative for chills, fever, night sweats or weight changes.  Cardiovascular: negative for chest pain, dyspnea on exertion, edema, orthopnea, palpitations, paroxysmal nocturnal dyspnea or shortness of breath Dermatological: negative for rash Respiratory: negative for cough or wheezing Urologic: negative for hematuria Abdominal: negative for nausea, vomiting, diarrhea, bright red blood per rectum, melena, or  hematemesis Neurologic: negative for visual changes, syncope, or dizziness All other systems reviewed and are otherwise negative except as noted above.    Blood pressure (!) 198/94, pulse (!) 51, height 5' 4.5" (1.638 m), weight 153 lb 3.2 oz (69.5 kg), SpO2 98 %.  General appearance: alert and no distress Neck: no adenopathy, no carotid bruit, no JVD, supple, symmetrical, trachea midline, and thyroid not enlarged, symmetric, no tenderness/mass/nodules Lungs: clear to auscultation bilaterally Heart: regular rate and rhythm, S1, S2 normal, no murmur, click, rub or gallop Extremities: extremities normal, atraumatic, no cyanosis or edema Pulses: 2+ and symmetric Skin: Skin color, texture, turgor normal. No rashes or lesions Neurologic: Grossly normal  EKG sinus bradycardia 51 with borderline LVH voltage and lateral T wave inversion unchanged from prior EKGs.  I personally reviewed this EKG.  ASSESSMENT AND PLAN:   Hyperlipidemia History of hyperlipidemia intolerant to statin therapy on Zetia with lipid profile performed 05/11/2021 revealing total cholesterol 182, LDL of 106 and HDL of 48.  Essential hypertension History of essential hypertension recently exacerbated due to diagnosis of thyroid and parathyroid disease.  Since they have been treated her blood pressures at home have been well controlled usually in the 125/68 range.  She is on clonidine, metoprolol and lisinopril.  Nonspecific abnormal electrocardiogram (ECG) (EKG) History of an abnormal EKG with LVH voltage and lateral T wave inversion.  When compared to an EKG performed many years ago there has been no change.     Lorretta Harp MD FACP,FACC,FAHA, [facility] 08/02/2021 2:09 PM

## 2021-08-02 NOTE — Assessment & Plan Note (Signed)
History of essential hypertension recently exacerbated due to diagnosis of thyroid and parathyroid disease.  Since they have been treated her blood pressures at home have been well controlled usually in the 125/68 range.  She is on clonidine, metoprolol and lisinopril.

## 2021-08-02 NOTE — Assessment & Plan Note (Signed)
History of hyperlipidemia intolerant to statin therapy on Zetia with lipid profile performed 05/11/2021 revealing total cholesterol 182, LDL of 106 and HDL of 48.

## 2021-08-03 NOTE — Addendum Note (Signed)
Addended by: Orma Render on: 08/03/2021 04:55 PM   Modules accepted: Orders

## 2021-08-22 ENCOUNTER — Telehealth: Payer: Self-pay | Admitting: Internal Medicine

## 2021-08-22 ENCOUNTER — Ambulatory Visit: Payer: Medicare PPO | Admitting: Internal Medicine

## 2021-08-22 ENCOUNTER — Encounter: Payer: Self-pay | Admitting: Internal Medicine

## 2021-08-22 VITALS — BP 154/90 | HR 65 | Ht 64.5 in | Wt 155.0 lb

## 2021-08-22 DIAGNOSIS — E210 Primary hyperparathyroidism: Secondary | ICD-10-CM

## 2021-08-22 DIAGNOSIS — M810 Age-related osteoporosis without current pathological fracture: Secondary | ICD-10-CM

## 2021-08-22 DIAGNOSIS — E0590 Thyrotoxicosis, unspecified without thyrotoxic crisis or storm: Secondary | ICD-10-CM

## 2021-08-22 DIAGNOSIS — E042 Nontoxic multinodular goiter: Secondary | ICD-10-CM | POA: Diagnosis not present

## 2021-08-22 LAB — COMPREHENSIVE METABOLIC PANEL
ALT: 34 U/L (ref 0–35)
AST: 48 U/L — ABNORMAL HIGH (ref 0–37)
Albumin: 3.7 g/dL (ref 3.5–5.2)
Alkaline Phosphatase: 106 U/L (ref 39–117)
BUN: 8 mg/dL (ref 6–23)
CO2: 28 mEq/L (ref 19–32)
Calcium: 10.3 mg/dL (ref 8.4–10.5)
Chloride: 105 mEq/L (ref 96–112)
Creatinine, Ser: 0.82 mg/dL (ref 0.40–1.20)
GFR: 70.66 mL/min (ref >60.00)
Glucose, Bld: 157 mg/dL — ABNORMAL HIGH (ref 70–99)
Potassium: 4.3 mEq/L (ref 3.5–5.1)
Sodium: 138 mEq/L (ref 135–145)
Total Bilirubin: 1.2 mg/dL (ref 0.2–1.2)
Total Protein: 6.5 g/dL (ref 6.0–8.3)

## 2021-08-22 LAB — T4, FREE: Free T4: 0.37 ng/dL — ABNORMAL LOW (ref 0.60–1.60)

## 2021-08-22 LAB — VITAMIN D 25 HYDROXY (VIT D DEFICIENCY, FRACTURES): VITD: 32.95 ng/mL (ref 30.00–100.00)

## 2021-08-22 LAB — TSH: TSH: 8.69 u[IU]/mL — ABNORMAL HIGH (ref 0.35–5.50)

## 2021-08-22 MED ORDER — METHIMAZOLE 5 MG PO TABS: 5.0000 mg | ORAL_TABLET | Freq: Three times a day (TID) | ORAL | 0 refills | Status: DC

## 2021-08-22 NOTE — Telephone Encounter (Signed)
Pt was seen today and forgot to request needing refill on methimazole (TAPAZOLE) 5 MG tablet  to be sent to [place], [place] [facility]  [address], [place] [address]  Phone:  [phone]  Fax:  [phone]   Please advise.

## 2021-08-22 NOTE — Progress Notes (Signed)
Name: Courtney Russell  MRN/ DOB: [id number], 1946-06-27    Age/ Sex: 76 y.o., female     PCP: Marrian Salvage, [place]   Reason for Endocrinology Evaluation:  Primary hyperparathyroidism     Initial Endocrinology Clinic Visit:  06/05/2018    PATIENT IDENTIFIER: Courtney Russell is a 76 y.o., female with a past medical history of hypertension and dyslipidemia. She has followed with [facility] since 06/05/2018 for consultative assistance with management of her hyperparathyroidism  HISTORICAL SUMMARY:  Courtney Russell has been diagnosed with hypercalcemia approximately in 2009, she has been asymptomatic all these years.In review of her records she has had intermittent hypercalcemia since 2015 with a serum calcium of 10.7 mg/dL (corrected 10.86) She denies any history of HCTZ or lithium use    24-hr urinary excretion of calcium 249.2 in 05/2018,calcium creatinine ratio of 0.029 which is consistent with primary hyperparathyroidism    DXA - Osteoporosis 06/2018 started on Alendronate    She was lost to follow up from 08/2018 until her return in 04/2021   Repeat DXA 12/2020 showed worsening distal 1/3rd of forearm but improvement in the hip area  No family history of hyperparathyroidism, but both parents have history of kidney stones  THYROID HISTORY:  She was noted with a suppressed TSH at 0.01 uIU/mL during routine labs in 04/2021  SUBJECTIVE:    Today (08/22/2021):  Courtney Russell is here for a follow-up on hyperparathyroidism.    Weight has been stable  Has IBS with alternating bowel movement - improving  Has occasional  palpitations - exacerbated by caffeine  Denies local neck symptoms  She has chronic GERD, on nexium PRN    Has polydipsia in the morning , no renal stones since her last visit   She not on OTC calcium tablets  She is on Vitamin D 2000 iu daily  Alendronate 70 mg once a week  Methimazole 5 mg , 2.5 tabs daily ( 1.5 tabs QAM  and 1 tab QPM)     HISTORY:  Past Medical History:  Past Medical History:  Diagnosis Date   GERD (gastroesophageal reflux disease)    High cholesterol    Hypertension    IBS (irritable bowel syndrome)    Palpitations    Prediabetes    Past Surgical History:  Past Surgical History:  Procedure Laterality Date   ABDOMINAL HYSTERECTOMY  1992   fibroids   BILATERAL SALPINGOOPHORECTOMY  1996   cysts   BREAST SURGERY     duct excision   CHOLECYSTECTOMY     COLONOSCOPY  1998, 2008   negative;Dr Olevia Perches   HERNIA REPAIR     TONSILLECTOMY     TUBAL LIGATION     Social History:  reports that she has never smoked. She has never used smokeless tobacco. She reports that she does not drink alcohol and does not use drugs. Family History: family history includes Alzheimer's disease in her father; Breast cancer in her sister; Cancer in her father; Colon polyps in her mother; Coronary artery disease in her sister; Dementia in her mother; Diabetes in her father and paternal aunt; Heart attack in her father; Stroke in her father; Transient ischemic attack in her mother.   HOME MEDICATIONS: Allergies as of 08/22/2021       Reactions   Dexbromphen-acetaminophen Other (See Comments)   Tachycardia, and leg numbness Tachycardia, and leg numbness   Fish Oil    REACTION: rash REACTION: rash   Codeine Other (  See Comments)   Mental status changes Mental status changes   Bisoprolol-hydrochlorothiazide Other (See Comments)   Dropped potassium level,hypokalemia Dropped potassium level,hypokalemia   Crestor [rosuvastatin]    Elevated LFTs   Ezetimibe-simvastatin Other (See Comments)   Elevated LFTs Makes liver enzymes too high Elevated LFTs Makes liver enzymes too high   Pravastatin    palpitations    Hydrochlorothiazide    REACTION: low potassium level REACTION: low potassium level   Other Palpitations   dryx oil dryx oil        Medication List        Accurate as of August 22, 2021  9:53 AM. If you have any questions, ask your nurse or doctor.          alendronate 70 MG tablet Commonly known as: Fosamax Take 1 tablet (70 mg total) by mouth once a week. Take with a full glass of water on an empty stomach.   ALPRAZolam 0.5 MG tablet Commonly known as: Xanax Take 1 tablet (0.5 mg total) by mouth daily as needed for anxiety.   amLODipine 5 MG tablet Commonly known as: NORVASC Take 1 tablet (5 mg total) by mouth daily.   aspirin 81 MG tablet Take 81 mg by mouth 1 dose over 46 hours.   cholecalciferol 1000 units tablet Commonly known as: VITAMIN D Vitamin D   cloNIDine 0.1 MG tablet Commonly known as: CATAPRES Take 1 tablet (0.1 mg total) by mouth 2 (two) times daily.   diclofenac Sodium 1 % Gel Commonly known as: VOLTAREN Apply 4 g topically 4 (four) times daily. To affected joint.   esomeprazole 40 MG capsule Commonly known as: NEXIUM Take 1 capsule (40 mg total) by mouth daily at 12 noon.   ezetimibe 10 MG tablet Commonly known as: Zetia Take 1 tablet (10 mg total) by mouth daily.   lisinopril 20 MG tablet Commonly known as: ZESTRIL Take 1 tablet (20 mg total) by mouth 2 (two) times daily.   methimazole 5 MG tablet Commonly known as: TAPAZOLE Take 1 tablet (5 mg total) by mouth 3 (three) times daily.   metoprolol tartrate 50 MG tablet Commonly known as: LOPRESSOR Take 1 tablet (50 mg total) by mouth 2 (two) times daily.   vitamin C 500 MG tablet Commonly known as: ASCORBIC ACID Take 500 mg by mouth daily.          OBJECTIVE:   PHYSICAL EXAM: VS: BP (!) 154/90 (BP Location: Left Arm, Patient Position: Sitting, Cuff Size: Small)    Pulse 65    Ht 5' 4.5" (1.638 m)    Wt 155 lb (70.3 kg)    SpO2 98%    BMI 26.19 kg/m    EXAM: General: Pt appears well and is in NAD Thyroid : NO nodules appreciated   Lungs: Clear with good BS bilat with no rales, rhonchi, or wheezes  Heart: Auscultation: RRR.  Abdomen: Normoactive bowel  sounds, soft, nontender, without masses or organomegaly palpable  Extremities:  BL LE: No pretibial edema normal ROM and strength.  Mental Status: Judgment, insight: Intact Memory: Intact for recent and remote events Mood and affect: No depression, anxiety, or agitation     DATA REVIEWED:  Latest Reference Range & Units 08/22/21 10:24  Sodium 135 - 145 mEq/L 138  Potassium 3.5 - 5.1 mEq/L 4.3  Chloride 96 - 112 mEq/L 105  CO2 19 - 32 mEq/L 28  Glucose 70 - 99 mg/dL 157 (H)  BUN 6 - 23 mg/dL  8  Creatinine 0.40 - 1.20 mg/dL 0.82  Calcium 8.4 - 10.5 mg/dL 10.3  Alkaline Phosphatase 39 - 117 U/L 106  Albumin 3.5 - 5.2 g/dL 3.7  AST 0 - 37 U/L 48 (H)  ALT 0 - 35 U/L 34  Total Protein 6.0 - 8.3 g/dL 6.5  Total Bilirubin 0.2 - 1.2 mg/dL 1.2  GFR >60.00 mL/min 70.66    Latest Reference Range & Units 08/22/21 10:24  VITD 30.00 - 100.00 ng/mL 32.95    Latest Reference Range & Units 08/22/21 10:24  TSH 0.35 - 5.50 uIU/mL 8.69 (H)  T4,Free(Direct) 0.60 - 1.60 ng/dL 0.37 (L)         Latest Reference Range & Units 06/29/21 09:27  TRAB <=2.00 IU/L 3.21 (H)      DXA 6/14/20122  01/10/2021 Change 2019  AP spine -1.8 Down 5.0%  RFN -1.0   Right total hip -1.5   LFN  -2.1   Left total hip -1.8 Up 3.0%   Left 1/3rd radium  -3.9 Down 10.0%   Thyroid Ultrasound 05/2021    Estimated total number of nodules >/= 1 cm: 3   Number of spongiform nodules >/=  2 cm not described below (TR1): 0   Number of mixed cystic and solid nodules >/= 1.5 cm not described below ([place]): 0   _________________________________________________________   Nodule # 1:   Location: Right; superior   Maximum size: 1.4 cm; Other 2 dimensions: 1.1 x 1.3 cm   Composition: solid/almost completely solid (2)   Echogenicity: hypoechoic (2)   Shape: not taller-than-wide (0)   Margins: smooth (0)   Echogenic foci: none (0)   ACR TI-RADS total points: 4.   ACR TI-RADS risk category: TR4 (4-6  points).   ACR TI-RADS recommendations:   *Given size (>/= 1 - 1.4 cm) and appearance, a follow-up ultrasound in 1 year should be considered based on TI-RADS criteria.   _________________________________________________________   Nodule # 4:   Location: Right; inferior   Maximum size: 1.1 cm; Other 2 dimensions: 0.6 x 0.8 cm   Composition: solid/almost completely solid (2)   Echogenicity: hypoechoic (2)   Shape: not taller-than-wide (0)   Margins: smooth (0)   Echogenic foci: none (0)   ACR TI-RADS total points: 4.   ACR TI-RADS risk category: TR4 (4-6 points).   ACR TI-RADS recommendations:   *Given size (>/= 1 - 1.4 cm) and appearance, a follow-up ultrasound in 1 year should be considered based on TI-RADS criteria.   _________________________________________________________   Nodule 7: 1.6 x 0.4 x 1.5 cm cystic nodule in the inferior left thyroid lobe does not meet criteria for imaging surveillance or FNA.   Remaining subcentimeter thyroid nodules do not meet criteria for FNA or imaging surveillance.   IMPRESSION: Nodules 1 and 4 located in the right thyroid lobe meet criteria for imaging follow-up. Annual ultrasound surveillance is recommended until 5 years of stability is documented.   ASSESSMENT / PLAN / RECOMMENDATIONS:   Hypercalcemia secondary to primary hyperparathyroidism:  -Patient is asymptomatic  -She does qualify for surgical parathyroidectomy due to osteoporosis, patient declined surgical intervention  - Serum calcium has been stable    Encourage hydration Avoid over-the-counter calcium and any calcium containing products such as Tums Consume 2-3 servings of dietary calcium on daily basis Continue vitamin D 2000 IUs daily    2. Osteoporosis :   - It is difficult to asceratin how much of this, is postmenopausal process vs primary hyperparathyroidism, at any point,  having primary hyperparathyroidism does not help the process.   - I have  attempted to prescribe Fosamax in 2020 but she never started it , husband has a negative experience with it, but she was able to started in 2022 without issues - we have discussed GI complications  Medication  Continue alendronate 70 mg once a week    3. Subclinical Hyperthyroidism :  - TRAb elevated which indicated active Graves' disease but she also has MNG and could be contributing to her hyperthyroidism  - We have opted opted to treat based on age and osteoporosis  -Patient is hypothyroid today, will reduce methimazole as below    Medication  Decrease methimazole 5 mg to 1.5 tablets daily   4. Multinodular Goiter :   - Thyroid ultrasound 05/2021 showed MNG but none meets criteria for FNA.       Signed electronically by: Mack Guise, MD   [facility]  [facility] [address] [place], [place] [address] Phone: [phone] FAX: [phone]   CC: Lance Sell, NP [place] [address] Phone: [phone] Fax: [phone]

## 2021-08-22 NOTE — Telephone Encounter (Signed)
Patient advised and will adjust medication. Patient concern that blood pressure will go back up with the decrease in medication.

## 2021-08-22 NOTE — Telephone Encounter (Signed)
Script sent  

## 2021-08-22 NOTE — Patient Instructions (Signed)
-   Continue  Methimazole 5 mg, 2.5  tablets daily for now  - Continue Fosamax 70 mg once weekly  - Continue Vitamin D 2000 iu daily

## 2021-08-22 NOTE — Telephone Encounter (Signed)
PLease let the pt know that the current methimazole dose is too much and to DECREASE it from two and half tablets to ONE AND A HALF tablets daily    Vitamin D is normal - continue vitamin d 2000 iu daily and calcium is normal     Thanks

## 2021-08-23 LAB — PARATHYROID HORMONE, INTACT (NO CA): PTH: 118 pg/mL — ABNORMAL HIGH (ref 16–77)

## 2021-08-23 MED ORDER — METHIMAZOLE 5 MG PO TABS: 7.5000 mg | ORAL_TABLET | Freq: Every day | ORAL | 1 refills | Status: DC

## 2021-09-21 ENCOUNTER — Telehealth: Payer: Self-pay | Admitting: Family

## 2021-09-21 NOTE — Telephone Encounter (Signed)
Pt stated she was near her aunt who has covid and she is now experiencing symptoms. She has a fever of 101.3 and congestion. She tested with symptoms yesterday and her test was negative. Advised pt to make an appointment to discuss this either in office or virtually. She declined and stated she does not feel like driving and doesn't feeling like figuring out how to do a vv. She is hoping medication can be sent in instead. Please advise pt of next steps.   [facility] [address] [place], [place] [address] [phone]

## 2021-09-22 ENCOUNTER — Encounter: Payer: Self-pay | Admitting: Family

## 2021-09-22 ENCOUNTER — Telehealth (INDEPENDENT_AMBULATORY_CARE_PROVIDER_SITE_OTHER): Payer: Medicare PPO | Admitting: Family

## 2021-09-22 VITALS — Temp 100.8°F

## 2021-09-22 DIAGNOSIS — U071 COVID-19: Secondary | ICD-10-CM

## 2021-09-22 MED ORDER — MOLNUPIRAVIR EUA 200MG CAPSULE: 4.0000 | ORAL_CAPSULE | Freq: Two times a day (BID) | ORAL | 0 refills | Status: AC

## 2021-09-22 NOTE — Telephone Encounter (Signed)
Pt has a VV this afternoon

## 2021-09-22 NOTE — Progress Notes (Signed)
Courtney Russell is a 76 y.o. female with the following history as recorded in EpicCare:  Patient Active Problem List   Diagnosis Date Noted   Hamstring tendinitis at origin 05/23/2021   Osteoporosis without current pathological fracture 09/05/2018   Hyperparathyroidism ([place]) 09/05/2018   Vitamin D deficiency 10/01/2016   Routine general medical examination at a health care facility 09/27/2015   Encounter for general adult medical examination with abnormal findings 09/27/2015   Urinary urgency 09/27/2015   Hypercalcemia 09/22/2013   Nonspecific abnormal electrocardiogram (ECG) (EKG) 09/18/2012   Irritable bowel disease 11/22/2010   NIPPLE DISCHARGE 08/29/2010   ANXIETY STATE, UNSPECIFIED 04/26/2010   GERD 04/26/2010   Type 2 diabetes mellitus ([place]) 04/21/2009   POSTMENOPAUSAL SYNDROME 04/21/2009   NONSPEC ELEVATION OF LEVELS OF TRANSAMINASE/LDH 04/21/2009   Hyperlipidemia 03/24/2007   Essential hypertension 03/24/2007    Current Outpatient Medications  Medication Sig Dispense Refill   alendronate (FOSAMAX) 70 MG tablet Take 1 tablet (70 mg total) by mouth once a week. Take with a full glass of water on an empty stomach. 13 tablet 2   ALPRAZolam (XANAX) 0.5 MG tablet Take 1 tablet (0.5 mg total) by mouth daily as needed for anxiety. 30 tablet 0   amLODipine (NORVASC) 5 MG tablet Take 1 tablet (5 mg total) by mouth daily. 90 tablet 0   aspirin 81 MG tablet Take 81 mg by mouth 1 dose over 46 hours.       cholecalciferol (VITAMIN D) 1000 units tablet Vitamin D     cloNIDine (CATAPRES) 0.1 MG tablet Take 1 tablet (0.1 mg total) by mouth 2 (two) times daily. 180 tablet 3   diclofenac Sodium (VOLTAREN) 1 % GEL Apply 4 g topically 4 (four) times daily. To affected joint. 100 g 11   esomeprazole (NEXIUM) 40 MG capsule Take 1 capsule (40 mg total) by mouth daily at 12 noon. 90 capsule 3   ezetimibe (ZETIA) 10 MG tablet Take 1 tablet (10 mg total) by mouth daily. 90 tablet 3   lisinopril  (ZESTRIL) 20 MG tablet Take 1 tablet (20 mg total) by mouth 2 (two) times daily. 180 tablet 1   methimazole (TAPAZOLE) 5 MG tablet Take 1.5 tablets (7.5 mg total) by mouth daily. 135 tablet 1   metoprolol tartrate (LOPRESSOR) 50 MG tablet Take 1 tablet (50 mg total) by mouth 2 (two) times daily. 180 tablet 3   molnupiravir EUA (LAGEVRIO) 200 mg CAPS capsule Take 4 capsules (800 mg total) by mouth 2 (two) times daily for 5 days. 40 capsule 0   vitamin C (ASCORBIC ACID) 500 MG tablet Take 500 mg by mouth daily.     No current facility-administered medications for this visit.    Allergies: Dexbromphen-acetaminophen, Fish oil, Codeine, Bisoprolol-hydrochlorothiazide, Crestor [rosuvastatin], Ezetimibe-simvastatin, Pravastatin, Hydrochlorothiazide, and Other  Past Medical History:  Diagnosis Date   GERD (gastroesophageal reflux disease)    High cholesterol    Hypertension    IBS (irritable bowel syndrome)    Palpitations    Prediabetes     Past Surgical History:  Procedure Laterality Date   ABDOMINAL HYSTERECTOMY  1992   fibroids   BILATERAL SALPINGOOPHORECTOMY  1996   cysts   BREAST SURGERY     duct excision   CHOLECYSTECTOMY     COLONOSCOPY  1998, 2008   negative;Dr Olevia Perches   HERNIA REPAIR     TONSILLECTOMY     TUBAL LIGATION      Family History  Problem Relation Age of Onset  Cancer Father        COLON & PROSTATE   Diabetes Father    Alzheimer's disease Father    Heart attack Father        MI in late 68s; CABG @ 36   Stroke Father        in 75s   Dementia Mother    Transient ischemic attack Mother        TIAs; AVM leaked in 41s   Colon polyps Mother    Coronary artery disease Sister        stent @ 23   Breast cancer Sister    Diabetes Paternal Aunt     Social History   Tobacco Use   Smoking status: Never   Smokeless tobacco: Never  Substance Use Topics   Alcohol use: No    Subjective:   I connected with Courtney Russell on 09/22/21 at  2:40 PM EST by a  telephone call and verified that I am speaking with the correct person using two identifiers.   I discussed the limitations of evaluation and management by telemedicine and the availability of in person appointments. The patient expressed understanding and agreed to proceed. Provider in office/ patient is at home; provider and patient are only 2 people on telephone call.   Started with congestion/ headache/ fever on Tuesday night; had been exposed to her 36yo aunt who had COVID; this morning, patient tested positive for COVID; is taking OTC Coricidin for symptom relief and does find beneficial; no chest pain or shortness of breath; temperature averaging 100-101;       Objective:  Vitals:   09/22/21 1414  Temp: (!) 100.8 F (38.2 C)    Lungs: Respirations unlabored;  Neurologic: Alert and oriented; speech intact; face symmetrical; moves all extremities well; CNII-XII intact without focal deficit   Assessment:  1. COVID-19     Plan:  Symptomatic treatment discussed; Rx for Molnupiravir- take as directed; ER precautions; follow up worse, no better.   Time spent 10 minutes  No follow-ups on file.  No orders of the defined types were placed in this encounter.   Requested Prescriptions   Signed Prescriptions Disp Refills   molnupiravir EUA (LAGEVRIO) 200 mg CAPS capsule 40 capsule 0    Sig: Take 4 capsules (800 mg total) by mouth 2 (two) times daily for 5 days.

## 2021-09-22 NOTE — Telephone Encounter (Signed)
Concerns will be addressed at [place]

## 2021-10-20 ENCOUNTER — Telehealth: Payer: Self-pay | Admitting: Family

## 2021-10-20 NOTE — Telephone Encounter (Signed)
Left message for patient to call back and schedule Medicare Annual Wellness Visit (AWV) in office.  ? ?If not able to come in office, please offer to do virtually or by telephone.  Left office number and my jabber [phone]. ? ?Last AWV:09/27/2016 ? ?Please schedule at anytime with Nurse Health Advisor. ?  ?

## 2021-11-02 ENCOUNTER — Ambulatory Visit (INDEPENDENT_AMBULATORY_CARE_PROVIDER_SITE_OTHER): Payer: Medicare PPO

## 2021-11-02 VITALS — Ht 64.5 in | Wt 150.0 lb

## 2021-11-02 DIAGNOSIS — Z0000 Encounter for general adult medical examination without abnormal findings: Secondary | ICD-10-CM | POA: Diagnosis not present

## 2021-11-02 NOTE — Patient Instructions (Signed)
Courtney Russell , ?Thank you for taking time to come for your Medicare Wellness Visit. I appreciate your ongoing commitment to your health goals. Please review the following plan we discussed and let me know if I can assist you in the future.  ? ?Screening recommendations/referrals: ?Colonoscopy: Cologuard 05/22/2022. Repeat every 3 years. ?Mammogram: Done 01/10/2022 Repeat annually ? ?Bone Density: Done 01/10/2022 Repeat every 2 years ? ?Recommended yearly ophthalmology/optometry visit for glaucoma screening and checkup ?Recommended yearly dental visit for hygiene and checkup ? ?Vaccinations: ?Influenza vaccine: Done Repeat annually ? ?Pneumococcal vaccine: Done 05/11/2021 and 04/25/2015 ?Tdap vaccine: Done. Repeat in 10 years ? ?Shingles vaccine: Done 03/03/2020 and 05/01/2019   ?Covid-19:Done 06/17/20, 11/08/19, 10/11/2019 ? ?Advanced directives: Advance directive discussed with you today. Even though you declined this today, please call our office should you change your mind, and we can give you the proper paperwork for you to fill out. ? ? ?Conditions/risks identified: Aim for 30 minutes of exercise or brisk walking, 6-8 glasses of water, and 5 servings of fruits and vegetables each day. ? ? ?Next appointment: Follow up in one year for your annual wellness visit 2024. ? ? ?Preventive Care 57 Years and Older, Female ?Preventive care refers to lifestyle choices and visits with your health care provider that can promote health and wellness. ?What does preventive care include? ?A yearly physical exam. This is also called an annual well check. ?Dental exams once or twice a year. ?Routine eye exams. Ask your health care provider how often you should have your eyes checked. ?Personal lifestyle choices, including: ?Daily care of your teeth and gums. ?Regular physical activity. ?Eating a healthy diet. ?Avoiding tobacco and drug use. ?Limiting alcohol use. ?Practicing safe sex. ?Taking low-dose aspirin every day. ?Taking vitamin and  mineral supplements as recommended by your health care provider. ?What happens during an annual well check? ?The services and screenings done by your health care provider during your annual well check will depend on your age, overall health, lifestyle risk factors, and family history of disease. ?Counseling  ?Your health care provider may ask you questions about your: ?Alcohol use. ?Tobacco use. ?Drug use. ?Emotional well-being. ?Home and relationship well-being. ?Sexual activity. ?Eating habits. ?History of falls. ?Memory and ability to understand (cognition). ?Work and work Statistician. ?Reproductive health. ?Screening  ?You may have the following tests or measurements: ?Height, weight, and BMI. ?Blood pressure. ?Lipid and cholesterol levels. These may be checked every 5 years, or more frequently if you are over 46 years old. ?Skin check. ?Lung cancer screening. You may have this screening every year starting at age 15 if you have a 30-pack-year history of smoking and currently smoke or have quit within the past 15 years. ?Fecal occult blood test (FOBT) of the stool. You may have this test every year starting at age 3. ?Flexible sigmoidoscopy or colonoscopy. You may have a sigmoidoscopy every 5 years or a colonoscopy every 10 years starting at age 27. ?Hepatitis C blood test. ?Hepatitis B blood test. ?Sexually transmitted disease (STD) testing. ?Diabetes screening. This is done by checking your blood sugar (glucose) after you have not eaten for a while (fasting). You may have this done every 1-3 years. ?Bone density scan. This is done to screen for osteoporosis. You may have this done starting at age 71. ?Mammogram. This may be done every 1-2 years. Talk to your health care provider about how often you should have regular mammograms. ?Talk with your health care provider about your test results, treatment options,  and if necessary, the need for more tests. ?Vaccines  ?Your health care provider may recommend  certain vaccines, such as: ?Influenza vaccine. This is recommended every year. ?Tetanus, diphtheria, and acellular pertussis (Tdap, Td) vaccine. You may need a Td booster every 10 years. ?Zoster vaccine. You may need this after age 85. ?Pneumococcal 13-valent conjugate (PCV13) vaccine. One dose is recommended after age 51. ?Pneumococcal polysaccharide (PPSV23) vaccine. One dose is recommended after age 41. ?Talk to your health care provider about which screenings and vaccines you need and how often you need them. ?This information is not intended to replace advice given to you by your health care provider. Make sure you discuss any questions you have with your health care provider. ?Document Released: 08/12/2015 Document Revised: 04/04/2016 Document Reviewed: 05/17/2015 ?Elsevier Interactive Patient Education ? 2017 [place]. ? ?Fall Prevention in the Home ?Falls can cause injuries. They can happen to people of all ages. There are many things you can do to make your home safe and to help prevent falls. ?What can I do on the outside of my home? ?Regularly fix the edges of walkways and driveways and fix any cracks. ?Remove anything that might make you trip as you walk through a door, such as a raised step or threshold. ?Trim any bushes or trees on the path to your home. ?Use bright outdoor lighting. ?Clear any walking paths of anything that might make someone trip, such as rocks or tools. ?Regularly check to see if handrails are loose or broken. Make sure that both sides of any steps have handrails. ?Any raised decks and porches should have guardrails on the edges. ?Have any leaves, snow, or ice cleared regularly. ?Use sand or salt on walking paths during winter. ?Clean up any spills in your garage right away. This includes oil or grease spills. ?What can I do in the bathroom? ?Use night lights. ?Install grab bars by the toilet and in the tub and shower. Do not use towel bars as grab bars. ?Use non-skid mats or  decals in the tub or shower. ?If you need to sit down in the shower, use a plastic, non-slip stool. ?Keep the floor dry. Clean up any water that spills on the floor as soon as it happens. ?Remove soap buildup in the tub or shower regularly. ?Attach bath mats securely with double-sided non-slip rug tape. ?Do not have throw rugs and other things on the floor that can make you trip. ?What can I do in the bedroom? ?Use night lights. ?Make sure that you have a light by your bed that is easy to reach. ?Do not use any sheets or blankets that are too big for your bed. They should not hang down onto the floor. ?Have a firm chair that has side arms. You can use this for support while you get dressed. ?Do not have throw rugs and other things on the floor that can make you trip. ?What can I do in the kitchen? ?Clean up any spills right away. ?Avoid walking on wet floors. ?Keep items that you use a lot in easy-to-reach places. ?If you need to reach something above you, use a strong step stool that has a grab bar. ?Keep electrical cords out of the way. ?Do not use floor polish or wax that makes floors slippery. If you must use wax, use non-skid floor wax. ?Do not have throw rugs and other things on the floor that can make you trip. ?What can I do with my stairs? ?Do not leave  any items on the stairs. ?Make sure that there are handrails on both sides of the stairs and use them. Fix handrails that are broken or loose. Make sure that handrails are as long as the stairways. ?Check any carpeting to make sure that it is firmly attached to the stairs. Fix any carpet that is loose or worn. ?Avoid having throw rugs at the top or bottom of the stairs. If you do have throw rugs, attach them to the floor with carpet tape. ?Make sure that you have a light switch at the top of the stairs and the bottom of the stairs. If you do not have them, ask someone to add them for you. ?What else can I do to help prevent falls? ?Wear shoes that: ?Do not  have high heels. ?Have rubber bottoms. ?Are comfortable and fit you well. ?Are closed at the toe. Do not wear sandals. ?If you use a stepladder: ?Make sure that it is fully opened. Do not climb a closed stepl

## 2021-11-02 NOTE — Progress Notes (Signed)
? ?Subjective:  ? Courtney Russell is a 76 y.o. female who presents for Medicare Annual (Subsequent) preventive examination. ?Virtual Visit via Telephone Note ? ?I connected with  Courtney Russell on 11/02/21 at 11:15 AM EDT by telephone and verified that I am speaking with the correct person using two identifiers. ? ?Location: ?Patient: HOME ?Provider: [facility] ?Persons participating in the virtual visit: patient/Nurse Health Advisor ?  ?I discussed the limitations, risks, security and privacy concerns of performing an evaluation and management service by telephone and the availability of in person appointments. The patient expressed understanding and agreed to proceed. ? ?Interactive audio and video telecommunications were attempted between this nurse and patient, however failed, due to patient having technical difficulties OR patient did not have access to video capability.  We continued and completed visit with audio only. ? ?Some vital signs may be absent or patient reported.  ? ?Courtney Driver, LPN ? ?Review of Systems    ? ?Cardiac Risk Factors include: advanced age (>20mn, >>58women);diabetes mellitus;hypertension;dyslipidemia;sedentary lifestyle ? ?   ?Objective:  ?  ?Today's Vitals  ? 11/02/21 1117  ?Weight: 150 lb (68 kg)  ?Height: 5' 4.5" (1.638 m)  ? ?Body mass index is 25.35 kg/m?. ? ? ?  11/02/2021  ? 11:25 AM 07/16/2018  ?  9:31 AM  ?Advanced Directives  ?Does Patient Have a Medical Advance Directive? No No  ?Would patient like information on creating a medical advance directive? No - Patient declined No - Patient declined  ? ? ?Current Medications (verified) ?Outpatient Encounter Medications as of 11/02/2021  ?Medication Sig  ? alendronate (FOSAMAX) 70 MG tablet Take 1 tablet (70 mg total) by mouth once a week. Take with a full glass of water on an empty stomach.  ? ALPRAZolam (XANAX) 0.5 MG tablet Take 1 tablet (0.5 mg total) by mouth daily as needed for anxiety.  ? amLODipine (NORVASC) 5 MG  tablet Take 1 tablet (5 mg total) by mouth daily.  ? aspirin 81 MG tablet Take 81 mg by mouth 1 dose over 46 hours.    ? cholecalciferol (VITAMIN D) 1000 units tablet Vitamin D  ? cloNIDine (CATAPRES) 0.1 MG tablet Take 1 tablet (0.1 mg total) by mouth 2 (two) times daily.  ? diclofenac Sodium (VOLTAREN) 1 % GEL Apply 4 g topically 4 (four) times daily. To affected joint.  ? esomeprazole (NEXIUM) 40 MG capsule Take 1 capsule (40 mg total) by mouth daily at 12 noon.  ? ezetimibe (ZETIA) 10 MG tablet Take 1 tablet (10 mg total) by mouth daily.  ? lisinopril (ZESTRIL) 20 MG tablet Take 1 tablet (20 mg total) by mouth 2 (two) times daily.  ? methimazole (TAPAZOLE) 5 MG tablet Take 1.5 tablets (7.5 mg total) by mouth daily.  ? metoprolol tartrate (LOPRESSOR) 50 MG tablet Take 1 tablet (50 mg total) by mouth 2 (two) times daily.  ? vitamin C (ASCORBIC ACID) 500 MG tablet Take 500 mg by mouth daily.  ? ?No facility-administered encounter medications on file as of 11/02/2021.  ? ? ?Allergies (verified) ?Dexbromphen-acetaminophen, Fish oil, Codeine, Bisoprolol-hydrochlorothiazide, Crestor [rosuvastatin], Ezetimibe-simvastatin, Pravastatin, Hydrochlorothiazide, and Other  ? ?History: ?Past Medical History:  ?Diagnosis Date  ? GERD (gastroesophageal reflux disease)   ? High cholesterol   ? Hypertension   ? IBS (irritable bowel syndrome)   ? Palpitations   ? Prediabetes   ? ?Past Surgical History:  ?Procedure Laterality Date  ? ABDOMINAL HYSTERECTOMY  1992  ? fibroids  ?  BILATERAL SALPINGOOPHORECTOMY  1996  ? cysts  ? BREAST SURGERY    ? duct excision  ? CHOLECYSTECTOMY    ? COLONOSCOPY  1998, 2008  ? negative;Dr Courtney Russell  ? HERNIA REPAIR    ? TONSILLECTOMY    ? TUBAL LIGATION    ? ?Family History  ?Problem Relation Age of Onset  ? Cancer Father   ?     COLON & PROSTATE  ? Diabetes Father   ? Alzheimer's disease Father   ? Heart attack Father   ?     MI in late 74s; CABG @ 7  ? Stroke Father   ?     in 62s  ? Dementia Mother   ?  Transient ischemic attack Mother   ?     TIAs; AVM leaked in 80s  ? Colon polyps Mother   ? Coronary artery disease Sister   ?     stent @ 71  ? Breast cancer Sister   ? Diabetes Paternal Aunt   ? ?Social History  ? ?Socioeconomic History  ? Marital status: Married  ?  Spouse name: Not on file  ? Number of children: 3  ? Years of education: 29  ? Highest education level: Not on file  ?Occupational History  ? Occupation: Retired  ?Tobacco Use  ? Smoking status: Never  ? Smokeless tobacco: Never  ?Substance and Sexual Activity  ? Alcohol use: No  ? Drug use: No  ? Sexual activity: Not on file  ?Other Topics Concern  ? Not on file  ?Social History Narrative  ? Fun: Travel and read  ? Denies abuse and feels safe at home.   ? ?Social Determinants of Health  ? ?Financial Resource Strain: Low Risk   ? Difficulty of Paying Living Expenses: Not hard at all  ?Food Insecurity: No Food Insecurity  ? Worried About Charity fundraiser in the Last Year: Never true  ? Ran Out of Food in the Last Year: Never true  ?Transportation Needs: No Transportation Needs  ? Lack of Transportation (Medical): No  ? Lack of Transportation (Non-Medical): No  ?Physical Activity: Insufficiently Active  ? Days of Exercise per Week: 3 days  ? Minutes of Exercise per Session: 30 min  ?Stress: No Stress Concern Present  ? Feeling of Stress : Only a little  ?Social Connections: Socially Integrated  ? Frequency of Communication with Friends and Family: More than three times a week  ? Frequency of Social Gatherings with Friends and Family: More than three times a week  ? Attends Religious Services: 1 to 4 times per year  ? Active Member of Clubs or Organizations: Yes  ? Attends Archivist Meetings: 1 to 4 times per year  ? Marital Status: Married  ? ? ?Tobacco Counseling ?Counseling given: Not Answered ? ? ?Clinical Intake: ? ?Pre-visit preparation completed: Yes ? ?Pain : No/denies pain ? ?  ? ?BMI - recorded: 25.35 ?Nutritional Status: BMI 25  -29 Overweight ?Nutritional Risks: Other (Comment) ?Diabetes: No ? ?How often do you need to have someone help you when you read instructions, pamphlets, or other written materials from your doctor or pharmacy?: 1 - Never ? ?Diabetic?NO ? ?Interpreter Needed?: No ? ?Information entered by :: mj Lawayne Hartig, pn ? ? ?Activities of Daily Living ? ?  11/02/2021  ? 11:27 AM 05/11/2021  ?  8:28 AM  ?In your present state of health, do you have any difficulty performing the following activities:  ?Hearing? 0  0  ?Vision? 0 0  ?Difficulty concentrating or making decisions? 1 0  ?Comment memory at times.   ?Walking or climbing stairs? 0 0  ?Dressing or bathing? 0 0  ?Doing errands, shopping? 0 0  ?Preparing Food and eating ? N   ?Using the Toilet? N   ?In the past six months, have you accidently leaked urine? N   ?Do you have problems with loss of bowel control? N   ?Managing your Medications? N   ?Managing your Finances? N   ?Housekeeping or managing your Housekeeping? N   ? ? ?Patient Care Team: ?Marrian Salvage, FNP as PCP - General (Internal Medicine) ? ?Indicate any recent Medical Services you may have received from other than [facility] providers in the past year (date may be approximate). ? ?   ?Assessment:  ? This is a routine wellness examination for Courtney Russell. ? ?Hearing/Vision screen ?Hearing Screening - Comments:: No hearing issues.  ?Vision Screening - Comments:: Glasses. 2021. ? ?Dietary issues and exercise activities discussed: ?Current Exercise Habits: Home exercise routine, Type of exercise: walking, Time (Minutes): 30, Frequency (Times/Week): 3, Weekly Exercise (Minutes/Week): 90, Intensity: Mild, Exercise limited by: cardiac condition(s) ? ? Goals Addressed   ? ?  ?  ?  ?  ? This Visit's Progress  ?  Exercise 3x per week (30 min per time)     ?  Continue to move and eat healthy. ?  ? ?  ? ?Depression Screen ? ?  11/02/2021  ? 11:21 AM 06/20/2021  ? 10:37 AM 05/23/2021  ?  9:47 AM 05/11/2021  ?  8:27 AM 05/09/2020  ?   9:07 AM 05/01/2019  ?  9:52 AM 04/21/2018  ?  8:34 AM  ?PHQ 2/9 Scores  ?PHQ - 2 Score 0 0 0 0 0 0 0  ?PHQ- 9 Score     0    ?  ?Fall Risk ? ?  11/02/2021  ? 11:25 AM 06/20/2021  ? 10:37 AM 05/23/2021  ?  9:47 A

## 2021-11-03 ENCOUNTER — Other Ambulatory Visit: Payer: Self-pay | Admitting: Family

## 2021-11-15 ENCOUNTER — Telehealth: Payer: Self-pay

## 2021-11-15 ENCOUNTER — Telehealth (INDEPENDENT_AMBULATORY_CARE_PROVIDER_SITE_OTHER): Payer: Medicare PPO | Admitting: Medical

## 2021-11-15 ENCOUNTER — Encounter: Payer: Self-pay | Admitting: Medical

## 2021-11-15 VITALS — BP 129/69 | HR 56

## 2021-11-15 DIAGNOSIS — R0981 Nasal congestion: Secondary | ICD-10-CM | POA: Diagnosis not present

## 2021-11-15 DIAGNOSIS — J3489 Other specified disorders of nose and nasal sinuses: Secondary | ICD-10-CM | POA: Diagnosis not present

## 2021-11-15 DIAGNOSIS — J40 Bronchitis, not specified as acute or chronic: Secondary | ICD-10-CM | POA: Diagnosis not present

## 2021-11-15 DIAGNOSIS — J301 Allergic rhinitis due to pollen: Secondary | ICD-10-CM | POA: Diagnosis not present

## 2021-11-15 MED ORDER — DOXYCYCLINE HYCLATE 100 MG PO TABS
100.0000 mg | ORAL_TABLET | Freq: Two times a day (BID) | ORAL | 0 refills | Status: DC
Start: 2021-11-15 — End: 2021-12-13

## 2021-11-15 MED ORDER — AZELASTINE HCL 0.1 % NA SOLN: 2.0000 | Freq: Two times a day (BID) | NASAL | 2 refills | Status: DC

## 2021-11-15 MED ORDER — BENZONATATE 100 MG PO CAPS: 100.0000 mg | ORAL_CAPSULE | Freq: Three times a day (TID) | ORAL | 0 refills | Status: DC | PRN

## 2021-11-15 MED ORDER — LEVOCETIRIZINE DIHYDROCHLORIDE 5 MG PO TABS: 5.0000 mg | ORAL_TABLET | Freq: Every evening | ORAL | 3 refills | Status: DC

## 2021-11-15 NOTE — Patient Instructions (Signed)
Recent allergic rhinitis signs and symptoms with a negative COVID test x 2.  Visit done by phone since video visit failed.  Patient has some sinus pressure reported and minimal productive cough.  No history of allergic rhinitis and some exposure to granddaughter and husband who had similar symptoms prior to her illness onset. ? ?Prescribing Xyzal antihistamine, Astelin nasal spray and benzonatate for cough. ? ?Providing doxycycline antibiotic for sinus infection and bronchitis.  Rx advisement given. ? ?Follow-up in 7 days or as as needed for persisting or worsening signs/symptoms. ?

## 2021-11-15 NOTE — Telephone Encounter (Signed)
Nurse Assessment ?Nurse: Lucky Cowboy, RN, Levada Dy Date/Time (Eastern Time): 11/15/2021 9:33:33 AM ?Confirm and document reason for call. If ?symptomatic, describe symptoms. ?---Caller stated that she has sore throat, cough, and ?fever for about a week. T-100.3 last night. She has not ?repeated temp reading today. Cough is affecting her ?sleep. Full ROM to her head and neck, swallowing ok. ?Does the patient have any new or worsening ?symptoms? ---Yes ?Will a triage be completed? ---Yes ?Related visit to physician within the last 2 weeks? ---No ?Does the PT have any chronic conditions? (i.e. ?diabetes, asthma, this includes High risk factors for ?pregnancy, etc.) ?---Yes ?List chronic conditions. ---thyroid, "pre-diabetes", HTN ?Is this a behavioral health or substance abuse call? ---No ?Guidelines ?Guideline Title Affirmed Question Affirmed Notes Nurse Date/Time (Eastern ?Time) ?Common Cold [1] Fever > 100.0 F ?(37.8 C) AND [2] ?diabetes mellitus ?or weak immune ?system (e.g., HIV ?positive, cancer ?chemo, splenectomy, ?organ transplant, ?chronic steroids) ?Lucky Cowboy, RN, Levada Dy 11/15/2021 9:36:27 ?AM ?PLEASE NOTE: All timestamps contained within this report are represented as [place] Standard Time. ?CONFIDENTIALTY NOTICE: This fax transmission is intended only for the addressee. It contains information that is legally privileged, confidential or ?otherwise protected from use or disclosure. If you are not the intended recipient, you are strictly prohibited from reviewing, disclosing, copying using ?or disseminating any of this information or taking any action in reliance on or regarding this information. If you have received this fax in error, please ?notify us immediately by telephone so that we can arrange for its return to [place]. Phone: [phone], Toll-Free: [phone], Fax: [phone] ?Page: 2 of 2 ?Call Id: [id number] ?Disp. Time (Eastern ?Time) Disposition Final User ?11/15/2021 9:08:32 AM Send To Nurse Lennox Laity, RN,  Olin Hauser ?11/15/2021 9:39:29 AM See HCP within 4 Hours (or ?PCP triage) ?Yes Dew, RN, Levada Dy ?Caller Disagree/Comply Comply ?Caller Understands Yes ?PreDisposition Go to Urgent Care/Walk-In Clinic ?Care Advice Given Per Guideline ?SEE HCP (OR PCP TRIAGE) WITHIN 4 HOURS: * IF OFFICE WILL BE OPEN: You need to be seen within the next 3 or 4 ?hours. Call your doctor (or NP/PA) now or as soon as the office opens. FEVER MEDICINES: * For fevers above 101? F (38.3? C) ?take either acetaminophen or ibuprofen. * They are over-the-counter (OTC) drugs that help treat both fever and pain. You can buy ?them at the drugstore. * The goal of fever therapy is to bring the fever down to a comfortable level. Remember that fever medicine ?usually lowers fever 2 degrees F (1 - 1 1/2 degrees C). CALL BACK IF: * You become worse CARE ADVICE given per Common ?Cold (Adult) guideline. ?Referrals ?REFERRED TO PCP OFFICE ?

## 2021-11-15 NOTE — Telephone Encounter (Signed)
Pt seen by Percell Miller today.  ?

## 2021-11-15 NOTE — Progress Notes (Signed)
? ?  Subjective:  ? ? Patient ID: Courtney Russell, female    DOB: April 08, 1946, 76 y.o.   MRN: [id number] ? ?HPI ? ?Virtual Visit via Telephone Note ? ?I connected with [name] on 11/15/21 at 10:00 AM EDT by telephone and verified that I am speaking with the correct person using two identifiers. ? ?Location: ?Patient: home ?Provider: office. ?  ?I discussed the limitations, risks, security and privacy concerns of performing an evaluation and management service by telephone and the availability of in person appointments. I also discussed with the patient that there may be a patient responsible charge related to this service. The patient expressed understanding and agreed to proceed. ? ? ?History of Present Illness: ? ? Pt states has been sick for one week. Symptoms started with st, dry cough, low grade fever. She states cough is dry but she can feel wet mucus.  ? ?Pt does have some sinus pressure. Occasional  colored mucus.  ? ?Before pt got sick her grandchild and husband had similar symptoms.  ? ?No wheezing from lungs  or sob. States rare wheeze in her throat. ? ?Pt took covid test 4 days ago and one last night. Both test were negative. 2 months ago had covid. ? ? ?Observations/Objective: ? ?General- no acute distress. Sounds nasal congestion. Sporadic cough during interview. ? ?Assessment and Plan: ?Patient Instructions  ?Recent allergic rhinitis signs and symptoms with a negative COVID test x 2.  Visit done by phone since video visit failed.  Patient has some sinus pressure reported and minimal productive cough.  No history of allergic rhinitis and some exposure to granddaughter and husband who had similar symptoms prior to her illness onset. ? ?Prescribing Xyzal antihistamine, Astelin nasal spray and benzonatate for cough. ? ?Providing doxycycline antibiotic for sinus infection and bronchitis.  Rx advisement given. ? ?Follow-up in 7 days or as as needed for persisting or worsening signs/symptoms.   ? ?Follow Up Instructions: ? ?  ?I discussed the assessment and treatment plan with the patient. The patient was provided an opportunity to ask questions and all were answered. The patient agreed with the plan and demonstrated an understanding of the instructions. ?  ?The patient was advised to call back or seek an in-person evaluation if the symptoms worsen or if the condition fails to improve as anticipated. ? ?Time spent with patient today was  17 minutes which consisted of chart revdiew, discussing diagnosis, work up treatment and documentation.  ? ? ?Mackie Pai, PA-C  ? ?Review of Systems ? ?   ?Objective:  ? Physical Exam ? ? ? ? ?   ?Assessment & Plan:  ? ? ?

## 2021-12-13 ENCOUNTER — Ambulatory Visit (INDEPENDENT_AMBULATORY_CARE_PROVIDER_SITE_OTHER): Payer: Medicare PPO | Admitting: Internal Medicine

## 2021-12-13 ENCOUNTER — Encounter: Payer: Self-pay | Admitting: Internal Medicine

## 2021-12-13 VITALS — BP 130/80 | HR 60 | Ht 64.5 in | Wt 152.4 lb

## 2021-12-13 DIAGNOSIS — M810 Age-related osteoporosis without current pathological fracture: Secondary | ICD-10-CM

## 2021-12-13 DIAGNOSIS — E042 Nontoxic multinodular goiter: Secondary | ICD-10-CM | POA: Diagnosis not present

## 2021-12-13 DIAGNOSIS — E0590 Thyrotoxicosis, unspecified without thyrotoxic crisis or storm: Secondary | ICD-10-CM | POA: Diagnosis not present

## 2021-12-13 DIAGNOSIS — E210 Primary hyperparathyroidism: Secondary | ICD-10-CM

## 2021-12-13 LAB — VITAMIN D 25 HYDROXY (VIT D DEFICIENCY, FRACTURES): VITD: 32.14 ng/mL (ref 30.00–100.00)

## 2021-12-13 LAB — BASIC METABOLIC PANEL
BUN: 8 mg/dL (ref 6–23)
CO2: 27 mEq/L (ref 19–32)
Calcium: 10.1 mg/dL (ref 8.4–10.5)
Chloride: 106 mEq/L (ref 96–112)
Creatinine, Ser: 0.81 mg/dL (ref 0.40–1.20)
GFR: 71.55 mL/min (ref >60.00)
Glucose, Bld: 191 mg/dL — ABNORMAL HIGH (ref 70–99)
Potassium: 3.9 mEq/L (ref 3.5–5.1)
Sodium: 140 mEq/L (ref 135–145)

## 2021-12-13 LAB — TSH: TSH: 16.99 u[IU]/mL — ABNORMAL HIGH (ref 0.35–5.50)

## 2021-12-13 LAB — T4, FREE: Free T4: 0.52 ng/dL — ABNORMAL LOW (ref 0.60–1.60)

## 2021-12-13 NOTE — Progress Notes (Signed)
Name: Courtney Russell  MRN/ DOB: [id number], 03-03-1946    Age/ Sex: 76 y.o., female     PCP: Marrian Salvage, [facility]   Reason for Endocrinology Evaluation:  Primary hyperparathyroidism     Initial Endocrinology Clinic Visit:  06/05/2018    PATIENT IDENTIFIER: Courtney Russell is a 76 y.o., female with a past medical history of hypertension and dyslipidemia. She has followed with [facility] since 06/05/2018 for consultative assistance with management of her hyperparathyroidism  HISTORICAL SUMMARY:  Courtney Russell has been diagnosed with hypercalcemia approximately in 2009, she has been asymptomatic all these years.In review of her records she has had intermittent hypercalcemia since 2015 with a serum calcium of 10.7 mg/dL (corrected 10.86) She denies any history of HCTZ or lithium use    24-hr urinary excretion of calcium 249.2 in 05/2018,calcium creatinine ratio of 0.029 which is consistent with primary hyperparathyroidism    DXA - Osteoporosis 06/2018 started on Alendronate    She was lost to follow up from 08/2018 until her return in 04/2021   Repeat DXA 12/2020 showed worsening distal 1/3rd of forearm but improvement in the hip area  No family history of hyperparathyroidism, but both parents have history of kidney stones     THYROID HISTORY:  She was noted with a suppressed TSH at 0.01 uIU/mL during routine labs in 04/2021 She was started on methimazole at the time  SUBJECTIVE:    Today (12/13/2021):  Courtney Russell is here for a follow-up on hyperparathyroidism, hyperthyroid and osteoporosis    Weight has been stable  No constipation or diarrhea with rare constipation  Denies palpitations  Denies local neck symptoms  She has chronic GERD, on nexium PRN  NO local neck swelling    No renal stones since her last visit   She not on OTC calcium tablets  She is on Vitamin D 2000 iu daily  Alendronate 70 mg once a week  Methimazole 5  mg , 1.5 tabs daily     HISTORY:  Past Medical History:  Past Medical History:  Diagnosis Date   GERD (gastroesophageal reflux disease)    High cholesterol    Hypertension    IBS (irritable bowel syndrome)    Palpitations    Prediabetes    Past Surgical History:  Past Surgical History:  Procedure Laterality Date   ABDOMINAL HYSTERECTOMY  1992   fibroids   BILATERAL SALPINGOOPHORECTOMY  1996   cysts   BREAST SURGERY     duct excision   CHOLECYSTECTOMY     COLONOSCOPY  1998, 2008   negative;Dr Olevia Perches   HERNIA REPAIR     TONSILLECTOMY     TUBAL LIGATION     Social History:  reports that she has never smoked. She has never used smokeless tobacco. She reports that she does not drink alcohol and does not use drugs. Family History: family history includes Alzheimer's disease in her father; Breast cancer in her sister; Cancer in her father; Colon polyps in her mother; Coronary artery disease in her sister; Dementia in her mother; Diabetes in her father and paternal aunt; Heart attack in her father; Stroke in her father; Transient ischemic attack in her mother.   HOME MEDICATIONS: Allergies as of 12/13/2021       Reactions   Dexbromphen-acetaminophen Other (See Comments)   Tachycardia, and leg numbness Tachycardia, and leg numbness   Fish Oil    REACTION: rash REACTION: rash   Codeine Other (See Comments)  Mental status changes Mental status changes   Bisoprolol-hydrochlorothiazide Other (See Comments)   Dropped potassium level,hypokalemia Dropped potassium level,hypokalemia   Crestor [rosuvastatin]    Elevated LFTs   Ezetimibe-simvastatin Other (See Comments)   Elevated LFTs Makes liver enzymes too high Elevated LFTs Makes liver enzymes too high   Pravastatin    palpitations    Hydrochlorothiazide    REACTION: low potassium level REACTION: low potassium level   Other Palpitations   dryx oil dryx oil        Medication List        Accurate as of Dec 13, 2021  7:27 AM. If you have any questions, ask your nurse or doctor.          alendronate 70 MG tablet Commonly known as: Fosamax Take 1 tablet (70 mg total) by mouth once a week. Take with a full glass of water on an empty stomach.   ALPRAZolam 0.5 MG tablet Commonly known as: Xanax Take 1 tablet (0.5 mg total) by mouth daily as needed for anxiety.   amLODipine 5 MG tablet Commonly known as: NORVASC Take 1 tablet (5 mg total) by mouth daily.   aspirin 81 MG tablet Take 81 mg by mouth 1 dose over 46 hours.   azelastine 0.1 % nasal spray Commonly known as: ASTELIN Place 2 sprays into both nostrils 2 (two) times daily. Use in each nostril as directed   benzonatate 100 MG capsule Commonly known as: TESSALON Take 1 capsule (100 mg total) by mouth 3 (three) times daily as needed for cough.   cholecalciferol 1000 units tablet Commonly known as: VITAMIN D Vitamin D   cloNIDine 0.1 MG tablet Commonly known as: CATAPRES Take 1 tablet (0.1 mg total) by mouth 2 (two) times daily.   diclofenac Sodium 1 % Gel Commonly known as: VOLTAREN Apply 4 g topically 4 (four) times daily. To affected joint.   doxycycline 100 MG tablet Commonly known as: VIBRA-TABS Take 1 tablet (100 mg total) by mouth 2 (two) times daily.   esomeprazole 40 MG capsule Commonly known as: NEXIUM Take 1 capsule (40 mg total) by mouth daily at 12 noon.   ezetimibe 10 MG tablet Commonly known as: Zetia Take 1 tablet (10 mg total) by mouth daily.   levocetirizine 5 MG tablet Commonly known as: XYZAL Take 1 tablet (5 mg total) by mouth every evening.   lisinopril 20 MG tablet Commonly known as: ZESTRIL Take 1 tablet by mouth twice daily   methimazole 5 MG tablet Commonly known as: TAPAZOLE Take 1.5 tablets (7.5 mg total) by mouth daily.   metoprolol tartrate 50 MG tablet Commonly known as: LOPRESSOR Take 1 tablet (50 mg total) by mouth 2 (two) times daily.   vitamin C 500 MG tablet Commonly known  as: ASCORBIC ACID Take 500 mg by mouth daily.          OBJECTIVE:   PHYSICAL EXAM: VS: BP 130/80 (BP Location: Left Arm, Patient Position: Sitting, Cuff Size: Small)   Pulse 60   Ht 5' 4.5" (1.638 m)   Wt 152 lb 6.4 oz (69.1 kg)   SpO2 99%   BMI 25.76 kg/m    EXAM: General: Pt appears well and is in NAD Thyroid : NO nodules appreciated   Lungs: Clear with good BS bilat with no rales, rhonchi, or wheezes  Heart: Auscultation: RRR.  Abdomen: Normoactive bowel sounds, soft, nontender, without masses or organomegaly palpable  Extremities:  BL LE: No pretibial edema normal ROM  and strength.  Mental Status: Judgment, insight: Intact Memory: Intact for recent and remote events Mood and affect: No depression, anxiety, or agitation     DATA REVIEWED:    Latest Reference Range & Units 12/13/21 10:17  Sodium 135 - 145 mEq/L 140  Potassium 3.5 - 5.1 mEq/L 3.9  Chloride 96 - 112 mEq/L 106  CO2 19 - 32 mEq/L 27  Glucose 70 - 99 mg/dL 191 (H)  BUN 6 - 23 mg/dL 8  Creatinine 0.40 - 1.20 mg/dL 0.81  Calcium 8.4 - 10.5 mg/dL 10.1  GFR >60.00 mL/min 71.55    Latest Reference Range & Units 12/13/21 10:17  VITD 30.00 - 100.00 ng/mL 32.14    Latest Reference Range & Units 12/13/21 10:17  TSH 0.35 - 5.50 uIU/mL 16.99 (H)  T4,Free(Direct) 0.60 - 1.60 ng/dL 0.52 (L)         Latest Reference Range & Units 06/29/21 09:27  TRAB <=2.00 IU/L 3.21 (H)      DXA 6/14/20122  01/10/2021 Change 2019  AP spine -1.8 Down 5.0%  RFN -1.0   Right total hip -1.5   LFN  -2.1   Left total hip -1.8 Up 3.0%   Left 1/3rd radium  -3.9 Down 10.0%   Thyroid Ultrasound 05/2021    Estimated total number of nodules >/= 1 cm: 3   Number of spongiform nodules >/=  2 cm not described below (TR1): 0   Number of mixed cystic and solid nodules >/= 1.5 cm not described below ([place]): 0   _________________________________________________________   Nodule # 1:   Location: Right; superior    Maximum size: 1.4 cm; Other 2 dimensions: 1.1 x 1.3 cm   Composition: solid/almost completely solid (2)   Echogenicity: hypoechoic (2)   Shape: not taller-than-wide (0)   Margins: smooth (0)   Echogenic foci: none (0)   ACR TI-RADS total points: 4.   ACR TI-RADS risk category: TR4 (4-6 points).   ACR TI-RADS recommendations:   *Given size (>/= 1 - 1.4 cm) and appearance, a follow-up ultrasound in 1 year should be considered based on TI-RADS criteria.   _________________________________________________________   Nodule # 4:   Location: Right; inferior   Maximum size: 1.1 cm; Other 2 dimensions: 0.6 x 0.8 cm   Composition: solid/almost completely solid (2)   Echogenicity: hypoechoic (2)   Shape: not taller-than-wide (0)   Margins: smooth (0)   Echogenic foci: none (0)   ACR TI-RADS total points: 4.   ACR TI-RADS risk category: TR4 (4-6 points).   ACR TI-RADS recommendations:   *Given size (>/= 1 - 1.4 cm) and appearance, a follow-up ultrasound in 1 year should be considered based on TI-RADS criteria.   _________________________________________________________   Nodule 7: 1.6 x 0.4 x 1.5 cm cystic nodule in the inferior left thyroid lobe does not meet criteria for imaging surveillance or FNA.   Remaining subcentimeter thyroid nodules do not meet criteria for FNA or imaging surveillance.   IMPRESSION: Nodules 1 and 4 located in the right thyroid lobe meet criteria for imaging follow-up. Annual ultrasound surveillance is recommended until 5 years of stability is documented.   ASSESSMENT / PLAN / RECOMMENDATIONS:   Hypercalcemia secondary to primary hyperparathyroidism:  -Patient is asymptomatic  -She does qualify for surgical parathyroidectomy due to osteoporosis, patient declined surgical intervention  - Serum calcium has been stable    Encourage hydration Avoid over-the-counter calcium and any calcium containing products such as Tums Consume 2-3  servings of dietary calcium  on daily basis Continue vitamin D 2000 IUs daily    2. Osteoporosis :   - It is difficult to asceratin how much of this, is postmenopausal process vs primary hyperparathyroidism, at any point, having primary hyperparathyroidism does not help the process.   - Will repeat DXA 2024    Medication  Continue alendronate 70 mg once a week    3. Subclinical Hyperthyroidism :  - TRAb elevated which indicated active Graves' disease but she also has MNG and could be contributing to her hyperthyroidism  - We have opted opted to treat based on age and osteoporosis  -Patient is hypothyroid today, stop methimazole and recheck in 4 weeks, we may end up just restart her on a small dose of two-point 5-5 Mg daily    Medication  Hold methimazole 5 mg Recheck TFTs in 4 weeks   4. Multinodular Goiter :   - Thyroid ultrasound 05/2021 showed MNG but none meets criteria for FNA.  - Will repeat by 05/2022      Signed electronically by: Mack Guise, MD   [facility]  [facility] [address] [place], [place] [address] Phone: [phone] FAX: [phone]   CC: Lance Sell, NP [facility] [place] [address] Phone: [phone] Fax: [phone]

## 2021-12-13 NOTE — Patient Instructions (Addendum)
-   Continue  Methimazole 5 mg, 1.5  tablets daily for now  ?- Continue Fosamax 70 mg once weekly  ?- Continue Vitamin D 2000 iu daily  ? ? ?24-Hour Urine Collection ? ?You will be collecting your urine for a 24-hour period of time. ?Your timer starts with your first urine of the morning (For example - If you first pee at [place], your timer will start at [place]) ?Throw away your first urine of the morning ?Collect your urine every time you pee for the next 24 hours ?STOP your urine collection 24 hours after you started the collection (For example - You would stop at 9AM the day after you started) ? ?

## 2021-12-14 ENCOUNTER — Telehealth: Payer: Self-pay | Admitting: Internal Medicine

## 2021-12-14 DIAGNOSIS — E0590 Thyrotoxicosis, unspecified without thyrotoxic crisis or storm: Secondary | ICD-10-CM

## 2021-12-14 LAB — PARATHYROID HORMONE, INTACT (NO CA): PTH: 177 pg/mL — ABNORMAL HIGH (ref 16–77)

## 2021-12-14 NOTE — Telephone Encounter (Signed)
Please asked the patient to stop methimazole altogether, and to recheck TFTs in a month   Please schedule her for a lab appointment 1 month out   Thanks

## 2021-12-14 NOTE — Telephone Encounter (Signed)
Patient notified and schedule for 1 month lab visit

## 2021-12-26 ENCOUNTER — Ambulatory Visit: Payer: Medicare PPO | Admitting: Internal Medicine

## 2022-01-12 ENCOUNTER — Other Ambulatory Visit: Payer: Medicare PPO

## 2022-01-16 ENCOUNTER — Other Ambulatory Visit (INDEPENDENT_AMBULATORY_CARE_PROVIDER_SITE_OTHER): Payer: Medicare PPO

## 2022-01-16 ENCOUNTER — Telehealth: Payer: Self-pay | Admitting: Internal Medicine

## 2022-01-16 DIAGNOSIS — E0590 Thyrotoxicosis, unspecified without thyrotoxic crisis or storm: Secondary | ICD-10-CM

## 2022-01-16 DIAGNOSIS — Z1231 Encounter for screening mammogram for malignant neoplasm of breast: Secondary | ICD-10-CM | POA: Diagnosis not present

## 2022-01-16 LAB — T4, FREE: Free T4: 0.77 ng/dL (ref 0.60–1.60)

## 2022-01-16 LAB — TSH: TSH: 0.86 u[IU]/mL (ref 0.35–5.50)

## 2022-01-16 NOTE — Telephone Encounter (Signed)
Please let the patient know that her thyroid function is normal, and I would suggest that she continues to hold off on taking any methimazole for now   Please schedule for repeat labs in another month

## 2022-01-17 LAB — T3: T3, Total: 172 ng/dL (ref 76–181)

## 2022-01-17 NOTE — Telephone Encounter (Signed)
Patient notified and will get information for [facility] where she is so we can send labs there.

## 2022-01-26 IMAGING — US US THYROID
2 series · 13 of 25 positions shown · non-contrast
Comparison: None.

CLINICAL DATA: Hyperthyroidism

EXAM:
THYROID ULTRASOUND
TECHNIQUE: Ultrasound examination of the thyroid gland and adjacent soft
tissues was performed.

[Series 1: us thyroid · 12 of 39 slices shown (1 of 2)]
[im 1/39]
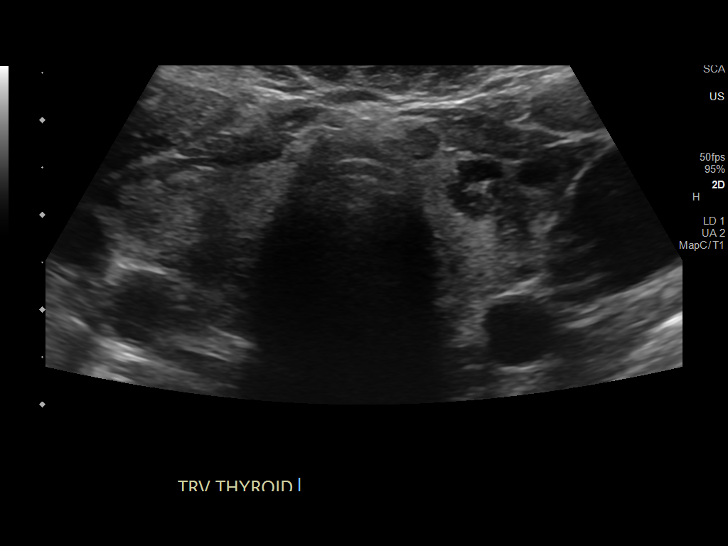
[im 4/39]
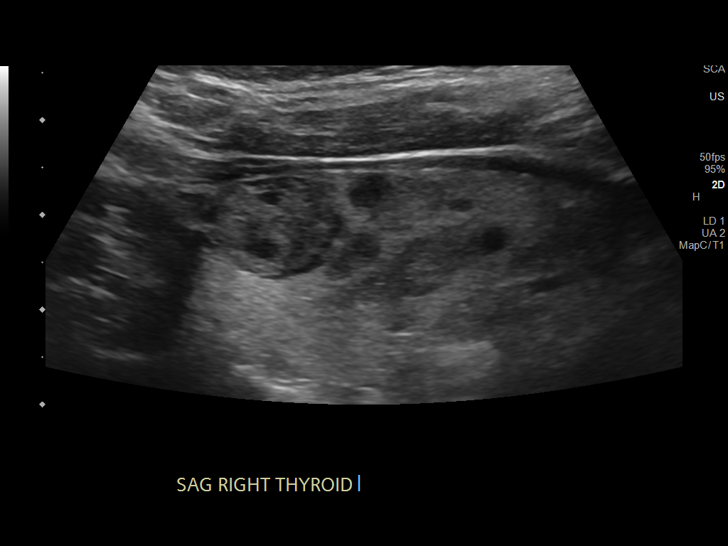
[im 7/39]
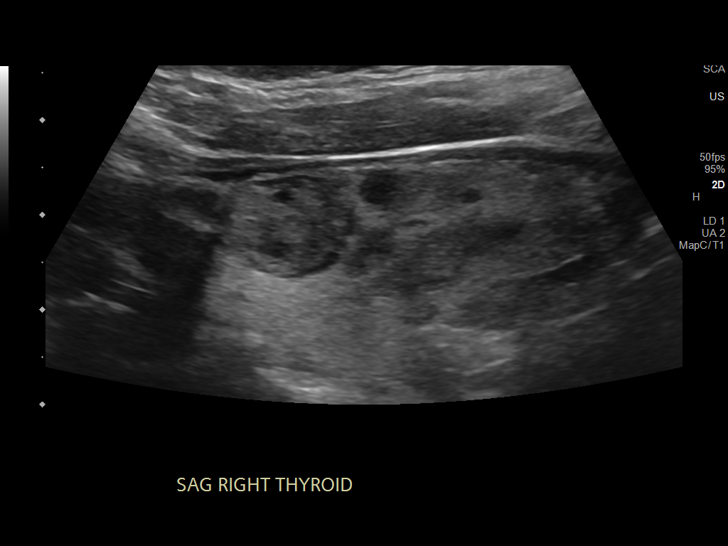
[im 11/39]
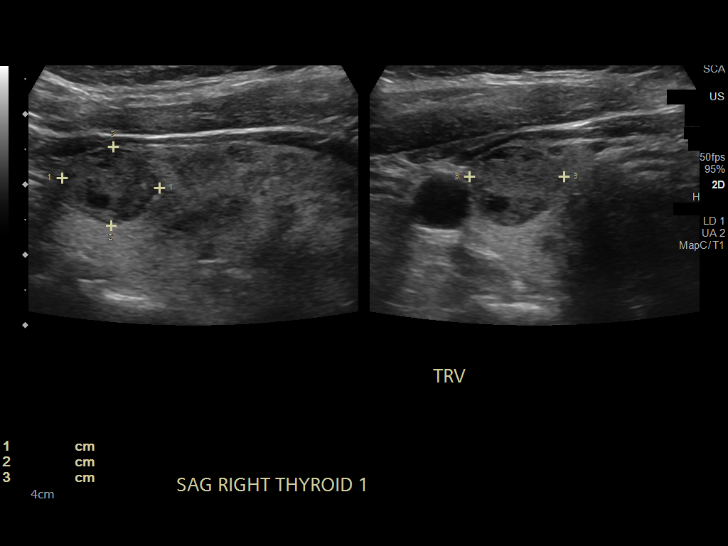
[im 14/39]
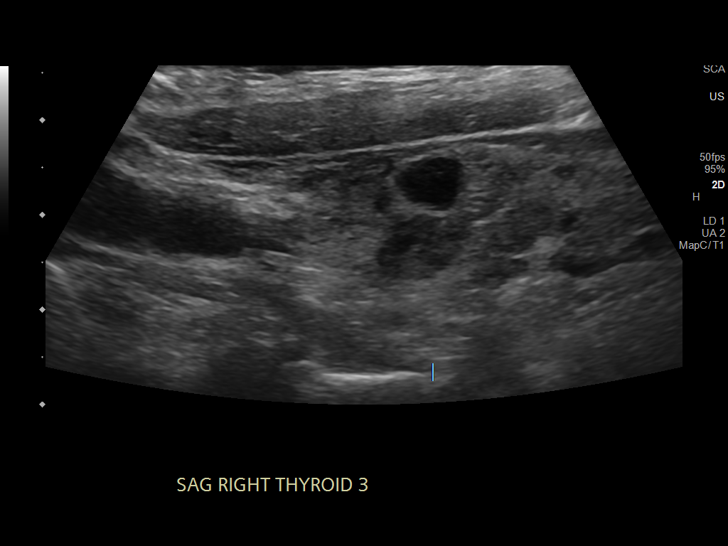
[im 18/39]
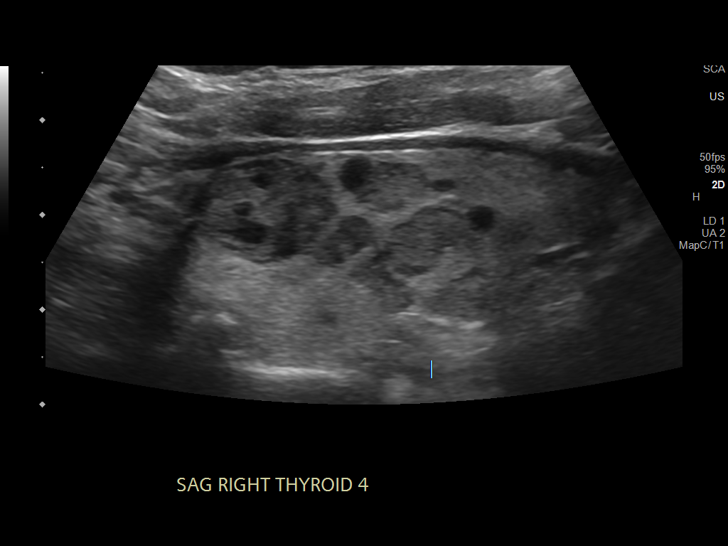
[im 21/39]
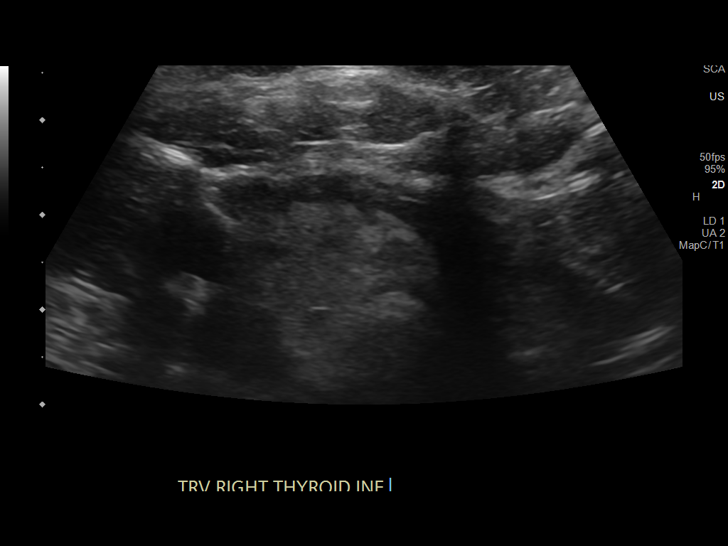
[im 25/39]
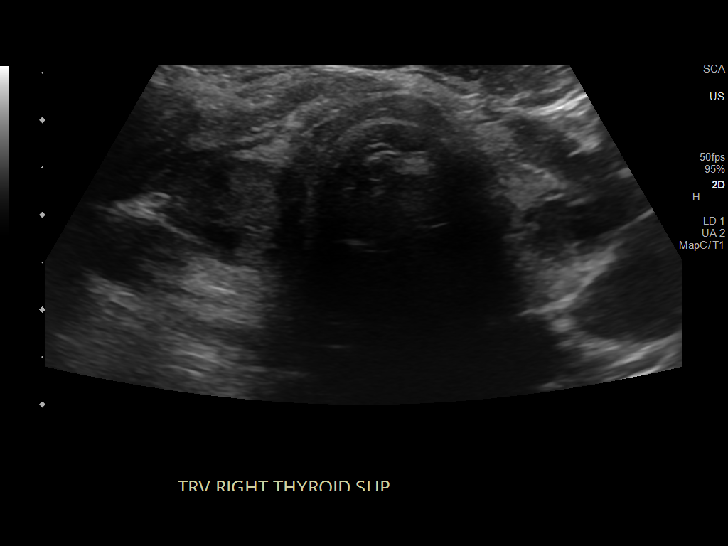
[im 28/39]
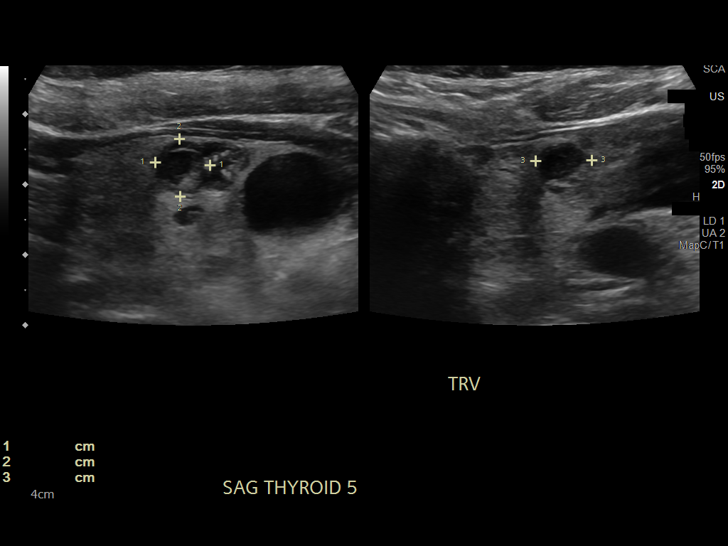
[im 32/39]
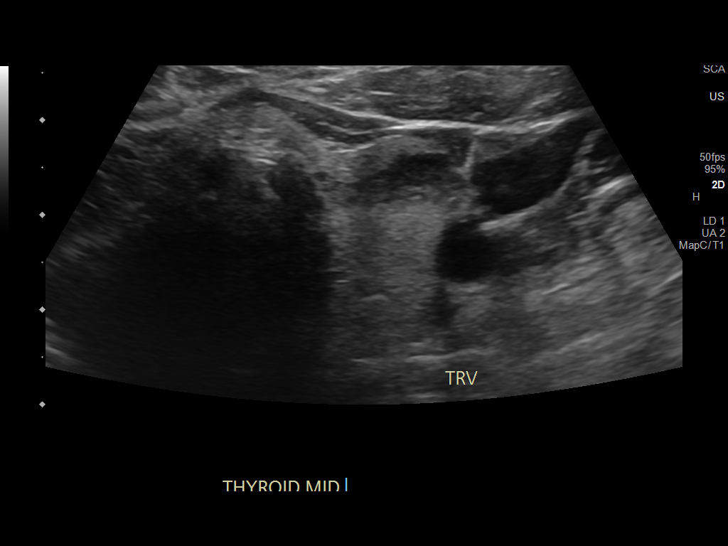
[im 35/39]
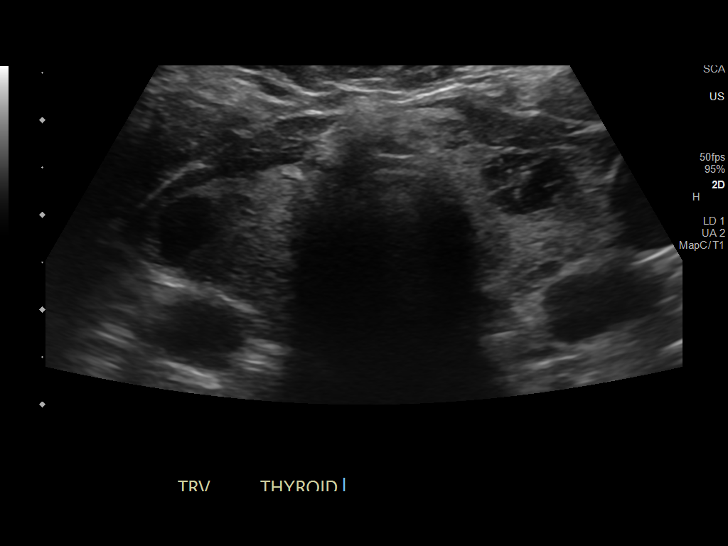
[im 39/39]
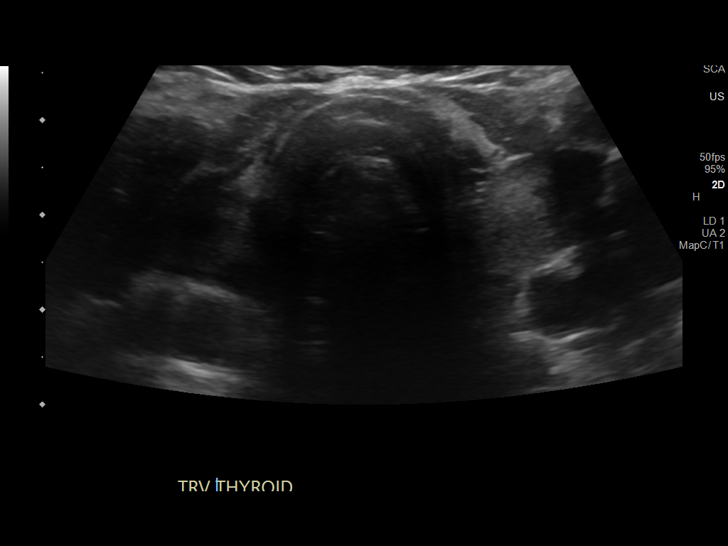

[Series 2: us thyroid · 1 of 3 slices shown (2 of 2)]
[im 3/3]
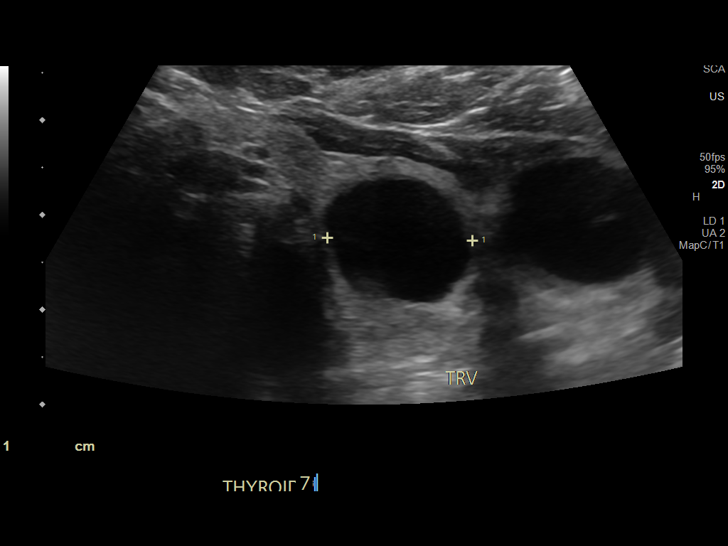

[13 of 25 positions shown; findings below may reference images not displayed]

FINDINGS: Parenchymal Echotexture: Moderately heterogeneous

Isthmus: 0.4 cm

Right lobe: 4.0 x 1.6 x 1.8 cm

Left lobe: 3.7 x 1.9 x 1.4 cm

_________________________________________________________

Estimated total number of nodules >/= 1 cm: 3

Number of spongiform nodules >/=  2 cm not described below (TR1): 0

Number of mixed cystic and solid nodules >/= 1.5 cm not described
below (TR2): 0

_________________________________________________________

Nodule # 1:

Location: Right; superior

Maximum size: 1.4 cm; Other 2 dimensions: 1.1 x 1.3 cm

Composition: solid/almost completely solid (2)

Echogenicity: hypoechoic (2)

Shape: not taller-than-wide (0)

Margins: smooth (0)

Echogenic foci: none (0)

ACR TI-RADS total points: 4.

ACR TI-RADS risk category: TR4 (4-6 points).

ACR TI-RADS recommendations:

*Given size (>/= 1 - 1.4 cm) and appearance, a follow-up ultrasound
in 1 year should be considered based on TI-RADS criteria.

_________________________________________________________

Nodule # 4:

Location: Right; inferior

Maximum size: 1.1 cm; Other 2 dimensions: 0.6 x 0.8 cm

Composition: solid/almost completely solid (2)

Echogenicity: hypoechoic (2)

Shape: not taller-than-wide (0)

Margins: smooth (0)

Echogenic foci: none (0)

ACR TI-RADS total points: 4.

ACR TI-RADS risk category: TR4 (4-6 points).

ACR TI-RADS recommendations:

*Given size (>/= 1 - 1.4 cm) and appearance, a follow-up ultrasound
in 1 year should be considered based on TI-RADS criteria.

_________________________________________________________

Nodule 7: 1.6 x 0.4 x 1.5 cm cystic nodule in the inferior left
thyroid lobe does not meet criteria for imaging surveillance or FNA.

Remaining subcentimeter thyroid nodules do not meet criteria for FNA
or imaging surveillance.
IMPRESSION: Nodules 1 and 4 located in the right thyroid lobe meet criteria for
imaging follow-up. Annual ultrasound surveillance is recommended
until 5 years of stability is documented.

The above is in keeping with the ACR TI-RADS recommendations - [HOSPITAL] 8114;[DATE].

## 2022-02-02 ENCOUNTER — Other Ambulatory Visit: Payer: Self-pay | Admitting: Family

## 2022-02-12 ENCOUNTER — Other Ambulatory Visit: Payer: Self-pay

## 2022-02-12 DIAGNOSIS — E0590 Thyrotoxicosis, unspecified without thyrotoxic crisis or storm: Secondary | ICD-10-CM

## 2022-02-12 NOTE — Addendum Note (Signed)
Addended by: Kaylyn Lim I on: 02/12/2022 03:19 PM   Modules accepted: Orders

## 2022-02-16 DIAGNOSIS — E0590 Thyrotoxicosis, unspecified without thyrotoxic crisis or storm: Secondary | ICD-10-CM | POA: Diagnosis not present

## 2022-02-17 LAB — T4, FREE: Free T4: 1.12 ng/dL (ref 0.82–1.77)

## 2022-02-17 LAB — TSH: TSH: 0.152 u[IU]/mL — ABNORMAL LOW (ref 0.450–4.500)

## 2022-05-11 ENCOUNTER — Other Ambulatory Visit: Payer: Self-pay | Admitting: Family

## 2022-05-15 ENCOUNTER — Encounter: Payer: Medicare PPO | Admitting: Family

## 2022-05-25 ENCOUNTER — Encounter: Payer: Self-pay | Admitting: Family

## 2022-05-25 ENCOUNTER — Ambulatory Visit (INDEPENDENT_AMBULATORY_CARE_PROVIDER_SITE_OTHER): Payer: Medicare PPO | Admitting: Family

## 2022-05-25 VITALS — BP 130/88 | HR 58 | Temp 98.1°F | Resp 17 | Ht 64.0 in | Wt 152.8 lb

## 2022-05-25 DIAGNOSIS — I10 Essential (primary) hypertension: Secondary | ICD-10-CM

## 2022-05-25 DIAGNOSIS — Z1211 Encounter for screening for malignant neoplasm of colon: Secondary | ICD-10-CM

## 2022-05-25 DIAGNOSIS — K219 Gastro-esophageal reflux disease without esophagitis: Secondary | ICD-10-CM

## 2022-05-25 DIAGNOSIS — E782 Mixed hyperlipidemia: Secondary | ICD-10-CM | POA: Diagnosis not present

## 2022-05-25 DIAGNOSIS — Z0000 Encounter for general adult medical examination without abnormal findings: Secondary | ICD-10-CM | POA: Diagnosis not present

## 2022-05-25 DIAGNOSIS — R7309 Other abnormal glucose: Secondary | ICD-10-CM

## 2022-05-25 LAB — COMPREHENSIVE METABOLIC PANEL
ALT: 34 U/L (ref 0–35)
AST: 51 U/L — ABNORMAL HIGH (ref 0–37)
Albumin: 3.7 g/dL (ref 3.5–5.2)
Alkaline Phosphatase: 68 U/L (ref 39–117)
BUN: 11 mg/dL (ref 6–23)
CO2: 28 mEq/L (ref 19–32)
Calcium: 9.9 mg/dL (ref 8.4–10.5)
Chloride: 105 mEq/L (ref 96–112)
Creatinine, Ser: 0.77 mg/dL (ref 0.40–1.20)
GFR: 75.8 mL/min (ref >60.00)
Glucose, Bld: 131 mg/dL — ABNORMAL HIGH (ref 70–99)
Potassium: 4.5 mEq/L (ref 3.5–5.1)
Sodium: 137 mEq/L (ref 135–145)
Total Bilirubin: 1.2 mg/dL (ref 0.2–1.2)
Total Protein: 6.5 g/dL (ref 6.0–8.3)

## 2022-05-25 LAB — CBC WITH DIFFERENTIAL/PLATELET
Basophils Absolute: 0 10*3/uL (ref 0.0–0.1)
Basophils Relative: 0.3 % (ref 0.0–3.0)
Eosinophils Absolute: 0.3 10*3/uL (ref 0.0–0.7)
Eosinophils Relative: 3.4 % (ref 0.0–5.0)
HCT: 44.1 % (ref 36.0–46.0)
Hemoglobin: 14.6 g/dL (ref 12.0–15.0)
Lymphocytes Relative: 31.6 % (ref 12.0–46.0)
Lymphs Abs: 2.5 10*3/uL (ref 0.7–4.0)
MCHC: 33.1 g/dL (ref 30.0–36.0)
MCV: 94 fl (ref 78.0–100.0)
Monocytes Absolute: 0.6 10*3/uL (ref 0.1–1.0)
Monocytes Relative: 7.8 % (ref 3.0–12.0)
Neutro Abs: 4.6 10*3/uL (ref 1.4–7.7)
Neutrophils Relative %: 56.9 % (ref 43.0–77.0)
Platelets: 242 10*3/uL (ref 150.0–400.0)
RBC: 4.69 Mil/uL (ref 3.87–5.11)
RDW: 14.1 % (ref 11.5–15.5)
WBC: 8.1 10*3/uL (ref 4.0–10.5)

## 2022-05-25 LAB — LIPID PANEL
Cholesterol: 181 mg/dL (ref 0–200)
HDL: 41.2 mg/dL (ref >39.00)
LDL Cholesterol: 113 mg/dL — ABNORMAL HIGH (ref 0–99)
NonHDL: 139.31
Total CHOL/HDL Ratio: 4
Triglycerides: 133 mg/dL (ref 0.0–149.0)
VLDL: 26.6 mg/dL (ref 0.0–40.0)

## 2022-05-25 LAB — HEMOGLOBIN A1C: Hgb A1c MFr Bld: 7 % — ABNORMAL HIGH (ref 4.6–6.5)

## 2022-05-25 MED ORDER — CLONIDINE HCL 0.1 MG PO TABS: 0.1000 mg | ORAL_TABLET | Freq: Two times a day (BID) | ORAL | 3 refills | Status: DC

## 2022-05-25 MED ORDER — ESOMEPRAZOLE MAGNESIUM 40 MG PO CPDR: 40.0000 mg | DELAYED_RELEASE_CAPSULE | Freq: Every day | ORAL | 3 refills | Status: DC

## 2022-05-25 MED ORDER — EZETIMIBE 10 MG PO TABS: 10.0000 mg | ORAL_TABLET | Freq: Every day | ORAL | 3 refills | Status: DC

## 2022-05-25 MED ORDER — AZELASTINE HCL 0.1 % NA SOLN: 2.0000 | Freq: Two times a day (BID) | NASAL | 2 refills | Status: DC

## 2022-05-25 MED ORDER — LEVOCETIRIZINE DIHYDROCHLORIDE 5 MG PO TABS: 5.0000 mg | ORAL_TABLET | Freq: Every evening | ORAL | 6 refills | Status: DC

## 2022-05-25 MED ORDER — METOPROLOL TARTRATE 50 MG PO TABS: 50.0000 mg | ORAL_TABLET | Freq: Two times a day (BID) | ORAL | 3 refills | Status: DC

## 2022-05-25 MED ORDER — AMLODIPINE BESYLATE 5 MG PO TABS
5.0000 mg | ORAL_TABLET | Freq: Every day | ORAL | 0 refills | Status: DC
Start: 2022-05-25 — End: 2023-05-31

## 2022-05-25 MED ORDER — DICLOFENAC SODIUM 1 % EX GEL: 4.0000 g | Freq: Four times a day (QID) | CUTANEOUS | 11 refills | Status: DC

## 2022-05-25 MED ORDER — LISINOPRIL 20 MG PO TABS: 20.0000 mg | ORAL_TABLET | Freq: Two times a day (BID) | ORAL | 3 refills | Status: DC

## 2022-05-25 NOTE — Progress Notes (Signed)
Courtney Russell is a 76 y.o. female with the following history as recorded in Epic[employer]:  Patient Active Problem List   Diagnosis Date Noted   Primary hyperparathyroidism ([place]) 12/13/2021   Age-related osteoporosis without current pathological fracture 12/13/2021   Hamstring tendinitis at origin 05/23/2021   Osteoporosis without current pathological fracture 09/05/2018   Hyperparathyroidism ([place]) 09/05/2018   Vitamin D deficiency 10/01/2016   Vaginal vault prolapse 02/16/2016   Heart murmur 02/14/2016   Routine general medical examination at a health care facility 09/27/2015   Encounter for general adult medical examination with abnormal findings 09/27/2015   Urinary urgency 09/27/2015   Hypercalcemia 09/22/2013   Nonspecific abnormal electrocardiogram (ECG) (EKG) 09/18/2012   Irritable bowel disease 11/22/2010   NIPPLE DISCHARGE 08/29/2010   ANXIETY STATE, UNSPECIFIED 04/26/2010   GERD 04/26/2010   Type 2 diabetes mellitus ([place]) 04/21/2009   POSTMENOPAUSAL SYNDROME 04/21/2009   NONSPEC ELEVATION OF LEVELS OF TRANSAMINASE/LDH 04/21/2009   Hyperlipidemia 03/24/2007   Essential hypertension 03/24/2007    Current Outpatient Medications  Medication Sig Dispense Refill   alendronate (FOSAMAX) 70 MG tablet Take 1 tablet (70 mg total) by mouth once a week. Take with a full glass of water on an empty stomach. 13 tablet 2   ALPRAZolam (XANAX) 0.5 MG tablet Take 1 tablet (0.5 mg total) by mouth daily as needed for anxiety. 30 tablet 0   aspirin 81 MG tablet Take 81 mg by mouth 1 dose over 46 hours.       cholecalciferol (VITAMIN D) 1000 units tablet Vitamin D     vitamin C (ASCORBIC ACID) 500 MG tablet Take 500 mg by mouth daily.     amLODipine (NORVASC) 5 MG tablet Take 1 tablet (5 mg total) by mouth daily. Use as needed if blood pressure is above 150/90 90 tablet 0   azelastine (ASTELIN) 0.1 % nasal spray Place 2 sprays into both nostrils 2 (two) times daily. Use in each nostril as  directed 30 mL 2   cloNIDine (CATAPRES) 0.1 MG tablet Take 1 tablet (0.1 mg total) by mouth 2 (two) times daily. 180 tablet 3   diclofenac Sodium (VOLTAREN) 1 % GEL Apply 4 g topically 4 (four) times daily. To affected joint. 100 g 11   esomeprazole (NEXIUM) 40 MG capsule Take 1 capsule (40 mg total) by mouth daily at 12 noon. 90 capsule 3   ezetimibe (ZETIA) 10 MG tablet Take 1 tablet (10 mg total) by mouth daily. 90 tablet 3   levocetirizine (XYZAL) 5 MG tablet Take 1 tablet (5 mg total) by mouth every evening. 30 tablet 6   lisinopril (ZESTRIL) 20 MG tablet Take 1 tablet (20 mg total) by mouth 2 (two) times daily. 180 tablet 3   methimazole (TAPAZOLE) 5 MG tablet Take 1.5 tablets (7.5 mg total) by mouth daily. 135 tablet 1   metoprolol tartrate (LOPRESSOR) 50 MG tablet Take 1 tablet (50 mg total) by mouth 2 (two) times daily. 180 tablet 3   No current facility-administered medications for this visit.    Allergies: Dexbromphen-acetaminophen, Fish oil, Codeine, Bisoprolol-hydrochlorothiazide, Crestor [rosuvastatin], Ezetimibe-simvastatin, Pravastatin, Hydrochlorothiazide, and Other  Past Medical History:  Diagnosis Date   GERD (gastroesophageal reflux disease)    High cholesterol    Hypertension    IBS (irritable bowel syndrome)    Palpitations    Prediabetes     Past Surgical History:  Procedure Laterality Date   ABDOMINAL HYSTERECTOMY  1992   fibroids   BILATERAL SALPINGOOPHORECTOMY  1996  cysts   BREAST SURGERY     duct excision   CHOLECYSTECTOMY     COLONOSCOPY  1998, 2008   negative;Dr Brodie   HERNIA REPAIR     TONSILLECTOMY     TUBAL LIGATION      Family History  Problem Relation Age of Onset   Cancer Father        COLON & PROSTATE   Diabetes Father    Alzheimer's disease Father    Heart attack Father        MI in late 89s; CABG @ 63   Stroke Father        in 36s   Dementia Mother    Transient ischemic attack Mother        TIAs; AVM leaked in 40s   Colon  polyps Mother    Coronary artery disease Sister        stent @ 34   Breast cancer Sister    Diabetes Paternal Aunt     Social History   Tobacco Use   Smoking status: Never   Smokeless tobacco: Never  Substance Use Topics   Alcohol use: No    Subjective:  Patient presents for yearly CPE; no acute concerns today; continuing to work with endocrinology regularly; notes that stress level is improved this year;   Review of Systems  Constitutional: Negative.   HENT: Negative.    Eyes: Negative.   Respiratory: Negative.    Cardiovascular: Negative.   Gastrointestinal: Negative.   Genitourinary: Negative.   Musculoskeletal: Negative.   Skin: Negative.   Neurological: Negative.   Endo/Heme/Allergies: Negative.   Psychiatric/Behavioral: Negative.          Objective:  Vitals:   05/25/22 0817  BP: 130/88  Pulse: (!) 58  Resp: 17  Temp: 98.1 F (36.7 C)  TempSrc: Oral  SpO2: 98%  Weight: 152 lb 12.8 oz (69.3 kg)  Height: _0  (1.626 m)    General: Well developed, well nourished, in no acute distress  Skin : Warm and dry.  Head: Normocephalic and atraumatic  Eyes: Sclera and conjunctiva clear; pupils round and reactive to light; extraocular movements intact  Ears: External normal; canals clear; tympanic membranes normal  Oropharynx: Pink, supple. No suspicious lesions  Neck: Supple without thyromegaly, adenopathy  Lungs: Respirations unlabored; clear to auscultation bilaterally without wheeze, rales, rhonchi  CVS exam: normal rate and regular rhythm.  Abdomen: Soft; nontender; nondistended; normoactive bowel sounds; no masses or hepatosplenomegaly  Musculoskeletal: No deformities; no active joint inflammation  Extremities: No edema, cyanosis, clubbing  Vessels: Symmetric bilaterally  Neurologic: Alert and oriented; speech intact; face symmetrical; moves all extremities well; CNII-XII intact without focal deficit   Assessment:  1. PE (physical exam), annual   2.  Colon cancer screening   3. Essential hypertension   4. Gastroesophageal reflux disease without esophagitis   5. Mixed hyperlipidemia   6. Elevated glucose     Plan:  Age appropriate preventive healthcare needs addressed; encouraged regular eye doctor and dental exams; encouraged regular exercise; will update labs and refills as needed today; follow-up to be determined; Planning to get flu shot updated at pharmacy;  Follow up in 1 year, sooner prn.   No follow-ups on file.  Orders Placed This Encounter  Procedures   Cologuard   CBC with Differential/Platelet   Comp Met (CMET)   Lipid panel   Hemoglobin A1c    Requested Prescriptions   Signed Prescriptions Disp Refills   amLODipine (NORVASC) 5 MG tablet  90 tablet 0    Sig: Take 1 tablet (5 mg total) by mouth daily. Use as needed if blood pressure is above 150/90   azelastine (ASTELIN) 0.1 % nasal spray 30 mL 2    Sig: Place 2 sprays into both nostrils 2 (two) times daily. Use in each nostril as directed   cloNIDine (CATAPRES) 0.1 MG tablet 180 tablet 3    Sig: Take 1 tablet (0.1 mg total) by mouth 2 (two) times daily.   diclofenac Sodium (VOLTAREN) 1 % GEL 100 g 11    Sig: Apply 4 g topically 4 (four) times daily. To affected joint.   esomeprazole (NEXIUM) 40 MG capsule 90 capsule 3    Sig: Take 1 capsule (40 mg total) by mouth daily at 12 noon.   ezetimibe (ZETIA) 10 MG tablet 90 tablet 3    Sig: Take 1 tablet (10 mg total) by mouth daily.   lisinopril (ZESTRIL) 20 MG tablet 180 tablet 3    Sig: Take 1 tablet (20 mg total) by mouth 2 (two) times daily.   metoprolol tartrate (LOPRESSOR) 50 MG tablet 180 tablet 3    Sig: Take 1 tablet (50 mg total) by mouth 2 (two) times daily.   levocetirizine (XYZAL) 5 MG tablet 30 tablet 6    Sig: Take 1 tablet (5 mg total) by mouth every evening.

## 2022-05-28 ENCOUNTER — Other Ambulatory Visit: Payer: Self-pay | Admitting: Family

## 2022-05-28 ENCOUNTER — Telehealth: Payer: Self-pay

## 2022-05-28 MED ORDER — ALPRAZOLAM 0.5 MG PO TABS: 0.5000 mg | ORAL_TABLET | Freq: Every day | ORAL | 0 refills | Status: DC | PRN

## 2022-05-28 NOTE — Telephone Encounter (Signed)
Patient called to remind provider to send a new prescription for her for alprazolam 0.5 mg to [facility]. Last filled 05/11/2021

## 2022-06-11 DIAGNOSIS — Z1211 Encounter for screening for malignant neoplasm of colon: Secondary | ICD-10-CM | POA: Diagnosis not present

## 2022-06-15 ENCOUNTER — Encounter: Payer: Self-pay | Admitting: Internal Medicine

## 2022-06-15 ENCOUNTER — Ambulatory Visit: Payer: Medicare PPO | Admitting: Internal Medicine

## 2022-06-15 VITALS — BP 142/80 | HR 60 | Ht 64.0 in | Wt 151.8 lb

## 2022-06-15 DIAGNOSIS — M810 Age-related osteoporosis without current pathological fracture: Secondary | ICD-10-CM | POA: Diagnosis not present

## 2022-06-15 DIAGNOSIS — E042 Nontoxic multinodular goiter: Secondary | ICD-10-CM | POA: Diagnosis not present

## 2022-06-15 DIAGNOSIS — E210 Primary hyperparathyroidism: Secondary | ICD-10-CM | POA: Diagnosis not present

## 2022-06-15 DIAGNOSIS — E0590 Thyrotoxicosis, unspecified without thyrotoxic crisis or storm: Secondary | ICD-10-CM

## 2022-06-15 LAB — TSH: TSH: 2.86 u[IU]/mL (ref 0.35–5.50)

## 2022-06-15 LAB — T4, FREE: Free T4: 0.85 ng/dL (ref 0.60–1.60)

## 2022-06-15 MED ORDER — METHIMAZOLE 5 MG PO TABS: 2.5000 mg | ORAL_TABLET | ORAL | 2 refills | Status: DC

## 2022-06-15 NOTE — Progress Notes (Signed)
Total volume 1,000.  Started 06-13-2022 9:05 am ended 06-14-2022 at 8:50 am.

## 2022-06-15 NOTE — Progress Notes (Signed)
Name: Courtney Russell  MRN/ DOB: [id number], 03-Nov-1945    Age/ Sex: 76 y.o., female     PCP: Marrian Salvage, [place]   Reason for Endocrinology Evaluation:  Primary hyperparathyroidism     Initial Endocrinology Clinic Visit:  06/05/2018    PATIENT IDENTIFIER: Courtney Russell is a 76 y.o., female with a past medical history of hypertension and dyslipidemia. She has followed with [facility] since 06/05/2018 for consultative assistance with management of her hyperparathyroidism  HISTORICAL SUMMARY:  Courtney Russell has been diagnosed with hypercalcemia approximately in 2009, she has been asymptomatic all these years.In review of her records she has had intermittent hypercalcemia since 2015 with a serum calcium of 10.7 mg/dL (corrected 10.86) She denies any history of HCTZ or lithium use    24-hr urinary excretion of calcium 249.2 in 05/2018,calcium creatinine ratio of 0.029 which is consistent with primary hyperparathyroidism    DXA - Osteoporosis 06/2018 started on Alendronate    She was lost to follow up from 08/2018 until her return in 04/2021   Repeat DXA 12/2020 showed worsening distal 1/3rd of forearm but improvement in the hip area  No family history of hyperparathyroidism, but both parents have history of kidney stones     THYROID HISTORY:  She was noted with a suppressed TSH at 0.01 uIU/mL during routine labs in 04/2021 She was started on methimazole at the time, we stopped it in 11/2021 but has to restart a smaller dose by 01/2022   SUBJECTIVE:    Today (06/15/2022):  Courtney Russell is here for a follow-up on hyperparathyroidism, MNG, hyperthyroid and osteoporosis    Weight has been stable  No constipation  Denies palpitations except rarely  Denies local neck symptoms  Denies tremors  No renal stones since her last visit    She is on Vitamin D 2000 iu daily  Alendronate 70 mg once a week  Methimazole 5 mg, half a tablet daily        HISTORY:  Past Medical History:  Past Medical History:  Diagnosis Date   GERD (gastroesophageal reflux disease)    High cholesterol    Hypertension    IBS (irritable bowel syndrome)    Palpitations    Prediabetes    Past Surgical History:  Past Surgical History:  Procedure Laterality Date   ABDOMINAL HYSTERECTOMY  1992   fibroids   BILATERAL SALPINGOOPHORECTOMY  1996   cysts   BREAST SURGERY     duct excision   CHOLECYSTECTOMY     COLONOSCOPY  1998, 2008   negative;Dr Olevia Perches   HERNIA REPAIR     TONSILLECTOMY     TUBAL LIGATION     Social History:  reports that she has never smoked. She has never used smokeless tobacco. She reports that she does not drink alcohol and does not use drugs. Family History: family history includes Alzheimer's disease in her father; Breast cancer in her sister; Cancer in her father; Colon polyps in her mother; Coronary artery disease in her sister; Dementia in her mother; Diabetes in her father and paternal aunt; Heart attack in her father; Stroke in her father; Transient ischemic attack in her mother.   HOME MEDICATIONS: Allergies as of 06/15/2022       Reactions   Dexbromphen-acetaminophen Other (See Comments)   Tachycardia, and leg numbness Tachycardia, and leg numbness   Fish Oil    REACTION: rash REACTION: rash   Codeine Other (See Comments)   Mental  status changes Mental status changes   Bisoprolol-hydrochlorothiazide Other (See Comments)   Dropped potassium level,hypokalemia Dropped potassium level,hypokalemia   Crestor [rosuvastatin]    Elevated LFTs   Ezetimibe-simvastatin Other (See Comments)   Elevated LFTs Makes liver enzymes too high Elevated LFTs Makes liver enzymes too high   Pravastatin    palpitations    Hydrochlorothiazide    REACTION: low potassium level REACTION: low potassium level   Other Palpitations   dryx oil dryx oil        Medication List        Accurate as of June 15, 2022  10:56 AM. If you have any questions, ask your nurse or doctor.          STOP taking these medications    alendronate 70 MG tablet Commonly known as: Fosamax Stopped by: Dorita Sciara, MD       TAKE these medications    ALPRAZolam 0.5 MG tablet Commonly known as: Xanax Take 1 tablet (0.5 mg total) by mouth daily as needed for anxiety.   amLODipine 5 MG tablet Commonly known as: NORVASC Take 1 tablet (5 mg total) by mouth daily. Use as needed if blood pressure is above 150/90   ascorbic acid 500 MG tablet Commonly known as: VITAMIN C Take 500 mg by mouth daily.   aspirin 81 MG tablet Take 81 mg by mouth 1 dose over 46 hours.   azelastine 0.1 % nasal spray Commonly known as: ASTELIN Place 2 sprays into both nostrils 2 (two) times daily. Use in each nostril as directed   cholecalciferol 1000 units tablet Commonly known as: VITAMIN D Vitamin D   cloNIDine 0.1 MG tablet Commonly known as: CATAPRES Take 1 tablet (0.1 mg total) by mouth 2 (two) times daily.   diclofenac Sodium 1 % Gel Commonly known as: VOLTAREN Apply 4 g topically 4 (four) times daily. To affected joint.   esomeprazole 40 MG capsule Commonly known as: NEXIUM Take 1 capsule (40 mg total) by mouth daily at 12 noon.   ezetimibe 10 MG tablet Commonly known as: Zetia Take 1 tablet (10 mg total) by mouth daily.   levocetirizine 5 MG tablet Commonly known as: XYZAL Take 1 tablet (5 mg total) by mouth every evening.   lisinopril 20 MG tablet Commonly known as: ZESTRIL Take 1 tablet (20 mg total) by mouth 2 (two) times daily.   methimazole 5 MG tablet Commonly known as: TAPAZOLE Take 2.5 mg by mouth daily. What changed: Another medication with the same name was removed. Continue taking this medication, and follow the directions you see here. Changed by: Dorita Sciara, MD   metoprolol tartrate 50 MG tablet Commonly known as: LOPRESSOR Take 1 tablet (50 mg total) by mouth 2 (two)  times daily.          OBJECTIVE:   PHYSICAL EXAM: VS: BP (!) 142/80 (BP Location: Left Arm, Patient Position: Sitting, Cuff Size: Small)   Pulse 60   Ht '5\' 4"'$  (1.626 m)   Wt 151 lb 12.8 oz (68.9 kg)   SpO2 96%   BMI 26.06 kg/m    EXAM: General: Pt appears well and is in NAD Thyroid : NO nodules appreciated   Lungs: Clear with good BS bilat with no rales, rhonchi, or wheezes  Heart: Auscultation: RRR.  Abdomen: Normoactive bowel sounds, soft, nontender, without masses or organomegaly palpable  Extremities:  BL LE: No pretibial edema normal ROM and strength.  Mental Status: Judgment, insight: Intact Memory: Intact  for recent and remote events Mood and affect: No depression, anxiety, or agitation     DATA REVIEWED:  Latest Reference Range & Units 06/15/22 11:09  TSH 0.35 - 5.50 uIU/mL 2.86  T4,Free(Direct) 0.60 - 1.60 ng/dL 0.85      Latest Reference Range & Units 05/25/22 08:51  Sodium 135 - 145 mEq/L 137  Potassium 3.5 - 5.1 mEq/L 4.5  Chloride 96 - 112 mEq/L 105  CO2 19 - 32 mEq/L 28  Glucose 70 - 99 mg/dL 131 (H)  BUN 6 - 23 mg/dL 11  Creatinine 0.40 - 1.20 mg/dL 0.77  Calcium 8.4 - 10.5 mg/dL 9.9  Alkaline Phosphatase 39 - 117 U/L 68  Albumin 3.5 - 5.2 g/dL 3.7  AST 0 - 37 U/L 51 (H)  ALT 0 - 35 U/L 34  Total Protein 6.0 - 8.3 g/dL 6.5  Total Bilirubin 0.2 - 1.2 mg/dL 1.2  GFR >60.00 mL/min 75.80      Latest Reference Range & Units 06/29/21 09:27  TRAB <=2.00 IU/L 3.21 (H)      DXA 01/10/2021  01/10/2021 Change 2019  AP spine -1.8 Down 5.0%  RFN -1.0   Right total hip -1.5   LFN  -2.1   Left total hip -1.8 Up 3.0%   Left 1/3rd radium  -3.9 Down 10.0%   Thyroid Ultrasound 05/2021    Estimated total number of nodules >/= 1 cm: 3   Number of spongiform nodules >/=  2 cm not described below (TR1): 0   Number of mixed cystic and solid nodules >/= 1.5 cm not described below ([place]): 0    _________________________________________________________   Nodule # 1:   Location: Right; superior   Maximum size: 1.4 cm; Other 2 dimensions: 1.1 x 1.3 cm   Composition: solid/almost completely solid (2)   Echogenicity: hypoechoic (2)   Shape: not taller-than-wide (0)   Margins: smooth (0)   Echogenic foci: none (0)   ACR TI-RADS total points: 4.   ACR TI-RADS risk category: TR4 (4-6 points).   ACR TI-RADS recommendations:   *Given size (>/= 1 - 1.4 cm) and appearance, a follow-up ultrasound in 1 year should be considered based on TI-RADS criteria.   _________________________________________________________   Nodule # 4:   Location: Right; inferior   Maximum size: 1.1 cm; Other 2 dimensions: 0.6 x 0.8 cm   Composition: solid/almost completely solid (2)   Echogenicity: hypoechoic (2)   Shape: not taller-than-wide (0)   Margins: smooth (0)   Echogenic foci: none (0)   ACR TI-RADS total points: 4.   ACR TI-RADS risk category: TR4 (4-6 points).   ACR TI-RADS recommendations:   *Given size (>/= 1 - 1.4 cm) and appearance, a follow-up ultrasound in 1 year should be considered based on TI-RADS criteria.   _________________________________________________________   Nodule 7: 1.6 x 0.4 x 1.5 cm cystic nodule in the inferior left thyroid lobe does not meet criteria for imaging surveillance or FNA.   Remaining subcentimeter thyroid nodules do not meet criteria for FNA or imaging surveillance.   IMPRESSION: Nodules 1 and 4 located in the right thyroid lobe meet criteria for imaging follow-up. Annual ultrasound surveillance is recommended until 5 years of stability is documented.   ASSESSMENT / PLAN / RECOMMENDATIONS:   Hypercalcemia secondary to primary hyperparathyroidism:  -Patient is asymptomatic  -She does qualify for surgical parathyroidectomy due to osteoporosis, patient declined surgical intervention  - Corrected serum calcium on labs from  October 2023 was normal at 10.14 mg/DL -  She had dropped 24-hour urinary calcium today  Encourage hydration Avoid over-the-counter calcium and any calcium containing products such as Tums Consume 2-3 servings of dietary calcium on daily basis Continue vitamin D 2000 IUs daily    2. Osteoporosis :   - It is difficult to asceratin how much of this, is postmenopausal process vs primary hyperparathyroidism, at any point, having primary hyperparathyroidism does not help the process.   - Will repeat DXA 2024    Medication  Continue alendronate 70 mg once a week    3. Subclinical Hyperthyroidism :  - TRAb elevated which indicated active Graves' disease but she also has MNG and could be contributing to her hyperthyroidism  - We have opted opted to treat based on age and osteoporosis  -She was on methimazole from October 2022 until May 2023 but restarted 01/2022 -TFTs are normal on today's labs, will reduce methimazole as below  Medication Decrease methimazole 5 mg, half a tablet 4 days weekly    4. Multinodular Goiter :   - Thyroid ultrasound 05/2021 showed MNG but none meets criteria for FNA.  -No local neck symptoms -We will proceed with repeat thyroid ultrasound    Follow-up in 6 months     Signed electronically by: Mack Guise, MD   [facility]  [facility] [address] [place], [place] [address] Phone: [phone] FAX: [phone]   CC: Lance Sell, NP [place] [address] Phone: [phone] Fax: [phone]

## 2022-06-15 NOTE — Patient Instructions (Signed)
-   Continue  Methimazole 5 mg, half a   tablet daily  - Continue Fosamax 70 mg once weekly  - Continue Vitamin D 2000 iu daily

## 2022-06-16 LAB — CALCIUM, URINE, 24 HOUR: Calcium, 24H Urine: 204 mg/24 h

## 2022-06-16 LAB — T3: T3, Total: 154 ng/dL (ref 76–181)

## 2022-06-16 LAB — CREATININE, URINE, 24 HOUR: Creatinine, 24H Ur: 0.7 g/(24.h) (ref 0.50–2.15)

## 2022-06-18 ENCOUNTER — Other Ambulatory Visit: Payer: Self-pay | Admitting: Family Medicine

## 2022-06-18 DIAGNOSIS — E042 Nontoxic multinodular goiter: Secondary | ICD-10-CM

## 2022-06-20 LAB — COLOGUARD: COLOGUARD: NEGATIVE

## 2022-07-02 ENCOUNTER — Ambulatory Visit (HOSPITAL_BASED_OUTPATIENT_CLINIC_OR_DEPARTMENT_OTHER)
Admission: RE | Admit: 2022-07-02 | Discharge: 2022-07-02 | Disposition: A | Discharge status: Home / Self Care | Payer: Medicare PPO | Source: Ambulatory Visit | Attending: Internal Medicine | Admitting: Internal Medicine

## 2022-07-02 DIAGNOSIS — E042 Nontoxic multinodular goiter: Secondary | ICD-10-CM | POA: Diagnosis not present

## 2022-07-05 ENCOUNTER — Other Ambulatory Visit: Payer: Medicare PPO

## 2022-08-06 DIAGNOSIS — R300 Dysuria: Secondary | ICD-10-CM | POA: Diagnosis not present

## 2022-08-07 ENCOUNTER — Ambulatory Visit: Payer: Medicare PPO | Admitting: Family

## 2022-09-12 DIAGNOSIS — E119 Type 2 diabetes mellitus without complications: Secondary | ICD-10-CM | POA: Diagnosis not present

## 2022-09-12 LAB — HM DIABETES EYE EXAM

## 2022-11-08 ENCOUNTER — Ambulatory Visit (INDEPENDENT_AMBULATORY_CARE_PROVIDER_SITE_OTHER): Payer: Medicare PPO | Admitting: *Deleted

## 2022-11-08 VITALS — Ht 64.0 in | Wt 144.0 lb

## 2022-11-08 DIAGNOSIS — Z0000 Encounter for general adult medical examination without abnormal findings: Secondary | ICD-10-CM | POA: Diagnosis not present

## 2022-11-08 NOTE — Progress Notes (Signed)
Subjective:  Pt completed ADLs, Fall risk, and SDOH during e-check in on 11/01/22.  Answers verified with pt.    Courtney Russell is a 77 y.o. female who presents for Medicare Annual (Subsequent) preventive examination.  I connected with  Thereasa Solo on 11/08/22 by a audio enabled telemedicine application and verified that I am speaking with the correct person using two identifiers.  Patient Location: Home  Provider Location: Office/Clinic  I discussed the limitations of evaluation and management by telemedicine. The patient expressed understanding and agreed to proceed.   Review of Systems     Cardiac Risk Factors include: advanced age (>35men, >67 women);diabetes mellitus;dyslipidemia;hypertension     Objective:    Today's Vitals   11/08/22 1017  Weight: 144 lb (65.3 kg)  Height: 5\' 4"  (1.626 m)   Body mass index is 24.72 kg/m.     11/08/2022   10:20 AM 11/02/2021   11:25 AM 07/16/2018    9:31 AM  Advanced Directives  Does Patient Have a Medical Advance Directive? No No No  Would patient like information on creating a medical advance directive? No - Patient declined No - Patient declined No - Patient declined    Current Medications (verified) Outpatient Encounter Medications as of 11/08/2022  Medication Sig   ALPRAZolam (XANAX) 0.5 MG tablet Take 1 tablet (0.5 mg total) by mouth daily as needed for anxiety.   amLODipine (NORVASC) 5 MG tablet Take 1 tablet (5 mg total) by mouth daily. Use as needed if blood pressure is above 150/90   aspirin 81 MG tablet Take 81 mg by mouth 1 dose over 46 hours.     azelastine (ASTELIN) 0.1 % nasal spray Place 2 sprays into both nostrils 2 (two) times daily. Use in each nostril as directed   cholecalciferol (VITAMIN D) 1000 units tablet Vitamin D   cloNIDine (CATAPRES) 0.1 MG tablet Take 1 tablet (0.1 mg total) by mouth 2 (two) times daily.   diclofenac Sodium (VOLTAREN) 1 % GEL APPLY 4GM TOPICALLY 4 TIMES DAILY TO AFFECTED JOINT    esomeprazole (NEXIUM) 40 MG capsule Take 1 capsule (40 mg total) by mouth daily at 12 noon.   ezetimibe (ZETIA) 10 MG tablet Take 1 tablet (10 mg total) by mouth daily.   levocetirizine (XYZAL) 5 MG tablet Take 1 tablet (5 mg total) by mouth every evening.   lisinopril (ZESTRIL) 20 MG tablet Take 1 tablet (20 mg total) by mouth 2 (two) times daily.   methimazole (TAPAZOLE) 5 MG tablet Take 0.5 tablets (2.5 mg total) by mouth as directed. Half a tablet 4 days a week only   metoprolol tartrate (LOPRESSOR) 50 MG tablet Take 1 tablet (50 mg total) by mouth 2 (two) times daily.   vitamin C (ASCORBIC ACID) 500 MG tablet Take 500 mg by mouth daily.   No facility-administered encounter medications on file as of 11/08/2022.    Allergies (verified) Dexbromphen-acetaminophen, Fish oil, Codeine, Bisoprolol-hydrochlorothiazide, Crestor [rosuvastatin], Ezetimibe-simvastatin, Pravastatin, Hydrochlorothiazide, and Other   History: Past Medical History:  Diagnosis Date   Anxiety    Diabetes mellitus without complication    GERD (gastroesophageal reflux disease)    Heart murmur    High cholesterol    Hypertension    IBS (irritable bowel syndrome)    Palpitations    Prediabetes    Past Surgical History:  Procedure Laterality Date   ABDOMINAL HYSTERECTOMY  1992   fibroids   BILATERAL SALPINGOOPHORECTOMY  1996   cysts   BREAST SURGERY  duct excision   CHOLECYSTECTOMY     COLONOSCOPY  1998, 2008   negative;Dr Juanda Chance   HERNIA REPAIR     TONSILLECTOMY     TUBAL LIGATION     Family History  Problem Relation Age of Onset   Cancer Father        COLON & PROSTATE   Diabetes Father    Alzheimer's disease Father    Heart attack Father        MI in late 2s; CABG @ 77   Stroke Father        in 57s   Dementia Mother    Transient ischemic attack Mother        TIAs; AVM leaked in 80s   Colon polyps Mother    Coronary artery disease Sister        stent @ 31   Breast cancer Sister     Diabetes Paternal Aunt    Social History   Socioeconomic History   Marital status: Married    Spouse name: Not on file   Number of children: 3   Years of education: 13   Highest education level: Not on file  Occupational History   Occupation: Retired  Tobacco Use   Smoking status: Never   Smokeless tobacco: Never  Substance and Sexual Activity   Alcohol use: No   Drug use: No   Sexual activity: Not on file  Other Topics Concern   Not on file  Social History Narrative   Fun: Travel and read   Denies abuse and feels safe at home.    Social Determinants of Health   Financial Resource Strain: Low Risk  (11/01/2022)   Overall Financial Resource Strain (CARDIA)    Difficulty of Paying Living Expenses: Not hard at all  Food Insecurity: No Food Insecurity (11/01/2022)   Hunger Vital Sign    Worried About Running Out of Food in the Last Year: Never true    Ran Out of Food in the Last Year: Never true  Transportation Needs: No Transportation Needs (11/01/2022)   PRAPARE - Administrator, Civil Service (Medical): No    Lack of Transportation (Non-Medical): No  Physical Activity: Insufficiently Active (11/01/2022)   Exercise Vital Sign    Days of Exercise per Week: 1 day    Minutes of Exercise per Session: 10 min  Stress: No Stress Concern Present (11/01/2022)   [employer] of Occupational Health - Occupational Stress Questionnaire    Feeling of Stress : Only a little  Social Connections: Unknown (11/01/2022)   Social Connection and Isolation Panel [NHANES]    Frequency of Communication with Friends and Family: More than three times a week    Frequency of Social Gatherings with Friends and Family: More than three times a week    Attends Religious Services: Not on Marketing executive or Organizations: No    Attends Banker Meetings: Never    Marital Status: Married    Tobacco Counseling Counseling given: Not Answered   Clinical  Intake:  Pre-visit preparation completed: Yes  Pain : No/denies pain  BMI - recorded: 24.72 Nutritional Status: BMI of 19-24  Normal Diabetes: Yes CBG done?: No Did pt. bring in CBG monitor from home?: No  How often do you need to have someone help you when you read instructions, pamphlets, or other written materials from your doctor or pharmacy?: 1 - Never   Activities of Daily Living    11/01/2022  11:32 PM  In your present state of health, do you have any difficulty performing the following activities:  Hearing? 0  Vision? 0  Difficulty concentrating or making decisions? 0  Walking or climbing stairs? 1  Comment knee arthritis  Dressing or bathing? 0  Doing errands, shopping? 0  Preparing Food and eating ? N  Using the Toilet? N  In the past six months, have you accidently leaked urine? N  Do you have problems with loss of bowel control? N  Managing your Medications? N  Managing your Finances? N  Housekeeping or managing your Housekeeping? N    Patient Care Team: Olive Bass, FNP as PCP - General (Internal Medicine)  Indicate any recent Medical Services you may have received from other than [facility] providers in the past year (date may be approximate).     Assessment:   This is a routine wellness examination for Courtney Russell.  Hearing/Vision screen No results found.  Dietary issues and exercise activities discussed: Current Exercise Habits: The patient does not participate in regular exercise at present, Exercise limited by: orthopedic condition(s)   Goals Addressed   None    Depression Screen    11/08/2022   10:22 AM 05/25/2022    8:19 AM 11/02/2021   11:21 AM 06/20/2021   10:37 AM 05/23/2021    9:47 AM 05/11/2021    8:27 AM 05/09/2020    9:07 AM  PHQ 2/9 Scores  PHQ - 2 Score 0 0 0 0 0 0 0  PHQ- 9 Score       0    Fall Risk    11/01/2022   11:32 PM 05/25/2022    8:19 AM 11/02/2021   11:25 AM 06/20/2021   10:37 AM 05/23/2021    9:47 AM  Fall  Risk   Falls in the past year? 1 0 0 0 0  Number falls in past yr: 0 0 0 0 0  Injury with Fall? 0 0 0 0 0  Risk for fall due to : No Fall Risks No Fall Risks;Other (Comment) No Fall Risks No Fall Risks No Fall Risks  Follow up Falls evaluation completed Falls evaluation completed Falls prevention discussed Falls evaluation completed Falls evaluation completed    FALL RISK PREVENTION PERTAINING TO THE HOME:  Any stairs in or around the home? Yes  If so, are there any without handrails? No  Home free of loose throw rugs in walkways, pet beds, electrical cords, etc? Yes  Adequate lighting in your home to reduce risk of falls? Yes   ASSISTIVE DEVICES UTILIZED TO PREVENT FALLS:  Life alert? No  Use of a cane, walker or w/c? No  Grab bars in the bathroom? No  Shower chair or bench in shower? No  Elevated toilet seat or a handicapped toilet? No   TIMED UP AND GO:  Was the test performed?  No, audio visit .    Cognitive Function:        11/08/2022   10:27 AM 11/02/2021   11:28 AM  6CIT Screen  What Year? 0 points 0 points  What month? 0 points 0 points  What time? 0 points 0 points  Count back from 20 0 points 0 points  Months in reverse 0 points 0 points  Repeat phrase 0 points 0 points  Total Score 0 points 0 points    Immunizations Immunization History  Administered Date(s) Administered   Influenza Inj Mdck Quad Pf 05/13/2017, 05/19/2018, 04/17/2019   Influenza-Unspecified 04/21/2013, 04/29/2014  Moderna Sars-Covid-2 Vaccination 10/11/2019, 11/08/2019, 06/17/2020   Pneumococcal Conjugate-13 04/25/2015   Pneumococcal Polysaccharide-23 09/18/2012, 05/11/2021   Td 04/21/2009   Zoster Recombinat (Shingrix) 05/01/2019, 03/03/2020    TDAP status: Due, Education has been provided regarding the importance of this vaccine. Advised may receive this vaccine at local pharmacy or Health Dept. Aware to provide a copy of the vaccination record if obtained from local pharmacy or  Health Dept. Verbalized acceptance and understanding.  Flu Vaccine status: Up to date  Pneumococcal vaccine status: Up to date  Covid-19 vaccine status: Information provided on how to obtain vaccines.   Qualifies for Shingles Vaccine? Yes   Zostavax completed No   Shingrix Completed?: Yes  Screening Tests Health Maintenance  Topic Date Due   Diabetic kidney evaluation - Urine ACR  01/17/2019   DTaP/Tdap/Td (2 - Tdap) 04/22/2019   FOOT EXAM  04/30/2020   COVID-19 Vaccine (4 - 2023-24 season) 03/30/2022   Medicare Annual Wellness (AWV)  11/03/2022   HEMOGLOBIN A1C  11/24/2022   INFLUENZA VACCINE  02/28/2023   Diabetic kidney evaluation - eGFR measurement  05/26/2023   OPHTHALMOLOGY EXAM  09/13/2023   Fecal DNA (Cologuard)  06/11/2025   Pneumonia Vaccine 83+ Years old  Completed   DEXA SCAN  Completed   Hepatitis C Screening  Completed   Zoster Vaccines- Shingrix  Completed   HPV VACCINES  Aged Out    Health Maintenance  Health Maintenance Due  Topic Date Due   Diabetic kidney evaluation - Urine ACR  01/17/2019   DTaP/Tdap/Td (2 - Tdap) 04/22/2019   FOOT EXAM  04/30/2020   COVID-19 Vaccine (4 - 2023-24 season) 03/30/2022   Medicare Annual Wellness (AWV)  11/03/2022    Colorectal cancer screening: Type of screening: Cologuard. Completed 06/11/22. Repeat every 3 years  Mammogram status: Completed 01/16/22. Repeat every year  Bone Density status: Completed 01/10/21. Results reflect: Bone density results: OSTEOPOROSIS. Repeat every 2 years.  Lung Cancer Screening: (Low Dose CT Chest recommended if Age 69-80 years, 30 pack-year currently smoking OR have quit w/in 15years.) does not qualify.   Additional Screening:  Hepatitis C Screening: does qualify; Completed 09/27/15  Vision Screening: Recommended annual ophthalmology exams for early detection of glaucoma and other disorders of the eye. Is the patient up to date with their annual eye exam?  Yes  Who is the provider or  what is the name of the office in which the patient attends annual eye exams? Dr. Heather [place] If pt is not established with a provider, would they like to be referred to a provider to establish care? No .   Dental Screening: Recommended annual dental exams for proper oral hygiene  Community Resource Referral / Chronic Care Management: CRR required this visit?  No   CCM required this visit?  No      Plan:     I have personally reviewed and noted the following in the patient's chart:   Medical and social history Use of alcohol, tobacco or illicit drugs  Current medications and supplements including opioid prescriptions. Patient is not currently taking opioid prescriptions. Functional ability and status Nutritional status Physical activity Advanced directives List of other physicians Hospitalizations, surgeries, and ER visits in previous 12 months Vitals Screenings to include cognitive, depression, and falls Referrals and appointments  In addition, I have reviewed and discussed with patient certain preventive protocols, quality metrics, and best practice recommendations. A written personalized care plan for preventive services as well as general preventive health recommendations were  provided to patient.   Due to this being a telephonic visit, the after visit summary with patients personalized plan was offered to patient via mail or my-chart. Patient would like to access on my-chart.   Donne AnonBender, Deontra Pereyra, [place]   11/08/2022   Nurse Notes: None

## 2022-11-08 NOTE — Patient Instructions (Signed)
Courtney Russell , Thank you for taking time to come for your Medicare Wellness Visit. I appreciate your ongoing commitment to your health goals. Please review the following plan we discussed and let me know if I can assist you in the future.      This is a list of the screening recommended for you and due dates:  Health Maintenance  Topic Date Due   Yearly kidney health urinalysis for diabetes  01/17/2019   DTaP/Tdap/Td vaccine (2 - Tdap) 04/22/2019   Complete foot exam   04/30/2020   COVID-19 Vaccine (4 - 2023-24 season) 03/30/2022   Hemoglobin A1C  11/24/2022   Flu Shot  02/28/2023   Yearly kidney function blood test for diabetes  05/26/2023   Eye exam for diabetics  09/13/2023   Medicare Annual Wellness Visit  11/08/2023   Cologuard (Stool DNA test)  06/11/2025   Pneumonia Vaccine  Completed   DEXA scan (bone density measurement)  Completed   Hepatitis C Screening: USPSTF Recommendation to screen - Ages 52-79 yo.  Completed   Zoster (Shingles) Vaccine  Completed   HPV Vaccine  Aged Out    Next appointment: Follow up in one year for your annual wellness visit.   Preventive Care 75 Years and Older, Female Preventive care refers to lifestyle choices and visits with your health care provider that can promote health and wellness. What does preventive care include? A yearly physical exam. This is also called an annual well check. Dental exams once or twice a year. Routine eye exams. Ask your health care provider how often you should have your eyes checked. Personal lifestyle choices, including: Daily care of your teeth and gums. Regular physical activity. Eating a healthy diet. Avoiding tobacco and drug use. Limiting alcohol use. Practicing safe sex. Taking low-dose aspirin every day. Taking vitamin and mineral supplements as recommended by your health care provider. What happens during an annual well check? The services and screenings done by your health care provider during  your annual well check will depend on your age, overall health, lifestyle risk factors, and family history of disease. Counseling  Your health care provider may ask you questions about your: Alcohol use. Tobacco use. Drug use. Emotional well-being. Home and relationship well-being. Sexual activity. Eating habits. History of falls. Memory and ability to understand (cognition). Work and work Astronomer. Reproductive health. Screening  You may have the following tests or measurements: Height, weight, and BMI. Blood pressure. Lipid and cholesterol levels. These may be checked every 5 years, or more frequently if you are over 1 years old. Skin check. Lung cancer screening. You may have this screening every year starting at age 28 if you have a 30-pack-year history of smoking and currently smoke or have quit within the past 15 years. Fecal occult blood test (FOBT) of the stool. You may have this test every year starting at age 8. Flexible sigmoidoscopy or colonoscopy. You may have a sigmoidoscopy every 5 years or a colonoscopy every 10 years starting at age 70. Hepatitis C blood test. Hepatitis B blood test. Sexually transmitted disease (STD) testing. Diabetes screening. This is done by checking your blood sugar (glucose) after you have not eaten for a while (fasting). You may have this done every 1-3 years. Bone density scan. This is done to screen for osteoporosis. You may have this done starting at age 55. Mammogram. This may be done every 1-2 years. Talk to your health care provider about how often you should have regular mammograms. Talk  with your health care provider about your test results, treatment options, and if necessary, the need for more tests. Vaccines  Your health care provider may recommend certain vaccines, such as: Influenza vaccine. This is recommended every year. Tetanus, diphtheria, and acellular pertussis (Tdap, Td) vaccine. You may need a Td booster every 10  years. Zoster vaccine. You may need this after age 17. Pneumococcal 13-valent conjugate (PCV13) vaccine. One dose is recommended after age 10. Pneumococcal polysaccharide (PPSV23) vaccine. One dose is recommended after age 16. Talk to your health care provider about which screenings and vaccines you need and how often you need them. This information is not intended to replace advice given to you by your health care provider. Make sure you discuss any questions you have with your health care provider. Document Released: 08/12/2015 Document Revised: 04/04/2016 Document Reviewed: 05/17/2015 Elsevier Interactive Patient Education  2017 [place] Prevention in the Home Falls can cause injuries. They can happen to people of all ages. There are many things you can do to make your home safe and to help prevent falls. What can I do on the outside of my home? Regularly fix the edges of walkways and driveways and fix any cracks. Remove anything that might make you trip as you walk through a door, such as a raised step or threshold. Trim any bushes or trees on the path to your home. Use bright outdoor lighting. Clear any walking paths of anything that might make someone trip, such as rocks or tools. Regularly check to see if handrails are loose or broken. Make sure that both sides of any steps have handrails. Any raised decks and porches should have guardrails on the edges. Have any leaves, snow, or ice cleared regularly. Use sand or salt on walking paths during winter. Clean up any spills in your garage right away. This includes oil or grease spills. What can I do in the bathroom? Use night lights. Install grab bars by the toilet and in the tub and shower. Do not use towel bars as grab bars. Use non-skid mats or decals in the tub or shower. If you need to sit down in the shower, use a plastic, non-slip stool. Keep the floor dry. Clean up any water that spills on the floor as soon as it  happens. Remove soap buildup in the tub or shower regularly. Attach bath mats securely with double-sided non-slip rug tape. Do not have throw rugs and other things on the floor that can make you trip. What can I do in the bedroom? Use night lights. Make sure that you have a light by your bed that is easy to reach. Do not use any sheets or blankets that are too big for your bed. They should not hang down onto the floor. Have a firm chair that has side arms. You can use this for support while you get dressed. Do not have throw rugs and other things on the floor that can make you trip. What can I do in the kitchen? Clean up any spills right away. Avoid walking on wet floors. Keep items that you use a lot in easy-to-reach places. If you need to reach something above you, use a strong step stool that has a grab bar. Keep electrical cords out of the way. Do not use floor polish or wax that makes floors slippery. If you must use wax, use non-skid floor wax. Do not have throw rugs and other things on the floor that can make you  trip. What can I do with my stairs? Do not leave any items on the stairs. Make sure that there are handrails on both sides of the stairs and use them. Fix handrails that are broken or loose. Make sure that handrails are as long as the stairways. Check any carpeting to make sure that it is firmly attached to the stairs. Fix any carpet that is loose or worn. Avoid having throw rugs at the top or bottom of the stairs. If you do have throw rugs, attach them to the floor with carpet tape. Make sure that you have a light switch at the top of the stairs and the bottom of the stairs. If you do not have them, ask someone to add them for you. What else can I do to help prevent falls? Wear shoes that: Do not have high heels. Have rubber bottoms. Are comfortable and fit you well. Are closed at the toe. Do not wear sandals. If you use a stepladder: Make sure that it is fully opened.  Do not climb a closed stepladder. Make sure that both sides of the stepladder are locked into place. Ask someone to hold it for you, if possible. Clearly mark and make sure that you can see: Any grab bars or handrails. First and last steps. Where the edge of each step is. Use tools that help you move around (mobility aids) if they are needed. These include: Canes. Walkers. Scooters. Crutches. Turn on the lights when you go into a dark area. Replace any light bulbs as soon as they burn out. Set up your furniture so you have a clear path. Avoid moving your furniture around. If any of your floors are uneven, fix them. If there are any pets around you, be aware of where they are. Review your medicines with your doctor. Some medicines can make you feel dizzy. This can increase your chance of falling. Ask your doctor what other things that you can do to help prevent falls. This information is not intended to replace advice given to you by your health care provider. Make sure you discuss any questions you have with your health care provider. Document Released: 05/12/2009 Document Revised: 12/22/2015 Document Reviewed: 08/20/2014 Elsevier Interactive Patient Education  2017 [employer].

## 2022-11-12 ENCOUNTER — Encounter: Payer: Self-pay | Admitting: *Deleted

## 2022-12-14 ENCOUNTER — Encounter: Payer: Self-pay | Admitting: Internal Medicine

## 2022-12-14 ENCOUNTER — Ambulatory Visit: Payer: Medicare PPO | Admitting: Internal Medicine

## 2022-12-14 ENCOUNTER — Other Ambulatory Visit: Payer: Self-pay | Admitting: Internal Medicine

## 2022-12-14 VITALS — BP 130/88 | HR 64 | Ht 64.0 in | Wt 147.0 lb

## 2022-12-14 DIAGNOSIS — E042 Nontoxic multinodular goiter: Secondary | ICD-10-CM

## 2022-12-14 DIAGNOSIS — M810 Age-related osteoporosis without current pathological fracture: Secondary | ICD-10-CM

## 2022-12-14 DIAGNOSIS — E320 Persistent hyperplasia of thymus: Secondary | ICD-10-CM | POA: Diagnosis not present

## 2022-12-14 DIAGNOSIS — E0590 Thyrotoxicosis, unspecified without thyrotoxic crisis or storm: Secondary | ICD-10-CM

## 2022-12-14 DIAGNOSIS — E210 Primary hyperparathyroidism: Secondary | ICD-10-CM

## 2022-12-14 NOTE — Patient Instructions (Addendum)
  Please have labs done at [facility]  today    Address : [address], [place], [place] [address] Phone [phone]    Please contact [facility] to schedule a bone density : Address: [address], [place], [place] [address]  Phone: [phone]

## 2022-12-14 NOTE — Progress Notes (Unsigned)
Name: Courtney Russell  MRN/ DOB: [id number], [birth year]    Age/ Sex: 77 y.o., female     PCP: Olive Bass, FNP   Reason for Endocrinology Evaluation:  Primary hyperparathyroidism     Initial Endocrinology Clinic Visit:  06/05/2018    PATIENT IDENTIFIER: Courtney Russell is a 77 y.o., female with a past medical history of hypertension and dyslipidemia. She has followed with [facility] since 06/05/2018 for consultative assistance with management of her hyperparathyroidism  HISTORICAL SUMMARY:  Courtney Russell has been diagnosed with hypercalcemia approximately in 2009, she has been asymptomatic all these years.In review of her records she has had intermittent hypercalcemia since 2015 with a serum calcium of 10.7 mg/dL (corrected 08.65) She denies any history of HCTZ or lithium use    24-hr urinary excretion of calcium 249.2 in 05/2018,calcium creatinine ratio of 0.029 which is consistent with primary hyperparathyroidism    DXA - Osteoporosis 06/2018 started on Alendronate    She was lost to follow up from 08/2018 until her return in 04/2021   Repeat DXA 12/2020 showed worsening distal 1/3rd of forearm but improvement in the hip area  No family history of hyperparathyroidism, but both parents have history of kidney stones     THYROID HISTORY:  She was noted with a suppressed TSH at 0.01 uIU/mL during routine labs in 04/2021 .She was started on methimazole at the time, we stopped it in 11/2021 but had to restart a smaller dose by 01/2022   SUBJECTIVE:    Today (12/14/2022):  Courtney Russell is here for a follow-up on hyperparathyroidism, MNG, hyperthyroidism and osteoporosis   Has chronic heartburn  No constipation or diarrhea  Denies local neck symptoms  Has rare  palpitations  Denies tremors  Denies renal stones  She fell ~6 months ago , s/p fall one step    She is on Vitamin D 2000 iu daily  Alendronate 70 mg once a week  Methimazole  5 mg, half a tablet 4 days a week       HISTORY:  Past Medical History:  Past Medical History:  Diagnosis Date   Anxiety    Diabetes mellitus without complication (HCC)    GERD (gastroesophageal reflux disease)    Heart murmur    High cholesterol    Hypertension    IBS (irritable bowel syndrome)    Palpitations    Prediabetes    Past Surgical History:  Past Surgical History:  Procedure Laterality Date   ABDOMINAL HYSTERECTOMY  1992   fibroids   BILATERAL SALPINGOOPHORECTOMY  1996   cysts   BREAST SURGERY     duct excision   CHOLECYSTECTOMY     COLONOSCOPY  1998, 2008   negative;Dr Juanda Chance   HERNIA REPAIR     TONSILLECTOMY     TUBAL LIGATION     Social History:  reports that she has never smoked. She has never used smokeless tobacco. She reports that she does not drink alcohol and does not use drugs. Family History: family history includes Alzheimer's disease in her father; Breast cancer in her sister; Cancer in her father; Colon polyps in her mother; Coronary artery disease in her sister; Dementia in her mother; Diabetes in her father and paternal aunt; Heart attack in her father; Stroke in her father; Transient ischemic attack in her mother.   HOME MEDICATIONS: Allergies as of 12/14/2022       Reactions   Dexbromphen-acetaminophen Other (See Comments)   Tachycardia,  and leg numbness Tachycardia, and leg numbness   Fish Oil    REACTION: rash REACTION: rash   Codeine Other (See Comments)   Mental status changes Mental status changes   Bisoprolol-hydrochlorothiazide Other (See Comments)   Dropped potassium level,hypokalemia Dropped potassium level,hypokalemia   Crestor [rosuvastatin]    Elevated LFTs   Ezetimibe-simvastatin Other (See Comments)   Elevated LFTs Makes liver enzymes too high Elevated LFTs Makes liver enzymes too high   Pravastatin    palpitations    Hydrochlorothiazide    REACTION: low potassium level REACTION: low potassium level   Other  Palpitations   dryx oil dryx oil        Medication List        Accurate as of Dec 14, 2022 10:14 AM. If you have any questions, ask your nurse or doctor.          ALPRAZolam 0.5 MG tablet Commonly known as: Xanax Take 1 tablet (0.5 mg total) by mouth daily as needed for anxiety.   amLODipine 5 MG tablet Commonly known as: NORVASC Take 1 tablet (5 mg total) by mouth daily. Use as needed if blood pressure is above 150/90   ascorbic acid 500 MG tablet Commonly known as: VITAMIN C Take 500 mg by mouth daily.   aspirin 81 MG tablet Take 81 mg by mouth 1 dose over 46 hours.   azelastine 0.1 % nasal spray Commonly known as: ASTELIN Place 2 sprays into both nostrils 2 (two) times daily. Use in each nostril as directed   cholecalciferol 1000 units tablet Commonly known as: VITAMIN D Vitamin D   cloNIDine 0.1 MG tablet Commonly known as: CATAPRES Take 1 tablet (0.1 mg total) by mouth 2 (two) times daily.   diclofenac Sodium 1 % Gel Commonly known as: VOLTAREN APPLY 4GM TOPICALLY 4 TIMES DAILY TO AFFECTED JOINT   esomeprazole 40 MG capsule Commonly known as: NEXIUM Take 1 capsule (40 mg total) by mouth daily at 12 noon.   ezetimibe 10 MG tablet Commonly known as: Zetia Take 1 tablet (10 mg total) by mouth daily.   levocetirizine 5 MG tablet Commonly known as: XYZAL Take 1 tablet (5 mg total) by mouth every evening.   lisinopril 20 MG tablet Commonly known as: ZESTRIL Take 1 tablet (20 mg total) by mouth 2 (two) times daily.   methimazole 5 MG tablet Commonly known as: TAPAZOLE Take 0.5 tablets (2.5 mg total) by mouth as directed. Half a tablet 4 days a week only   metoprolol tartrate 50 MG tablet Commonly known as: LOPRESSOR Take 1 tablet (50 mg total) by mouth 2 (two) times daily.          OBJECTIVE:   PHYSICAL EXAM: VS: BP 130/88 (BP Location: Left Arm, Patient Position: Sitting, Cuff Size: Large)   Pulse 64   Ht 5\' 4"  (1.626 m)   Wt 147 lb  (66.7 kg)   SpO2 99%   BMI 25.23 kg/m    EXAM: General: Pt appears well and is in NAD Thyroid : NO nodules appreciated   Lungs: Clear with good BS bilat with no rales, rhonchi, or wheezes  Heart: Auscultation: RRR.  Abdomen: Normoactive bowel sounds, soft, nontender, without masses or organomegaly palpable  Extremities:  BL LE: No pretibial edema normal ROM and strength.  Mental Status: Judgment, insight: Intact Memory: Intact for recent and remote events Mood and affect: No depression, anxiety, or agitation     DATA REVIEWED:  ****     Latest  Reference Range & Units 06/29/21 09:27  TRAB <=2.00 IU/L 3.21 (H)      DXA 01/10/2021 @ [facility]  01/10/2021 Change 2019  AP spine -1.8 Down 5.0%  RFN -1.0   Right total hip -1.5   LFN  -2.1   Left total hip -1.8 Up 3.0%   Left 1/3rd radium  -3.9 Down 10.0%     Thyroid Ultrasound 07/02/2022    Estimated total number of nodules >/= 1 cm: 5   Number of spongiform nodules >/=  2 cm not described below (TR1): 0   Number of mixed cystic and solid nodules >/= 1.5 cm not described below (TR2): 0   _________________________________________________________   Nodule # 1:   Prior biopsy: No   Location: Right; superior   Maximum size: 1.5 cm; Other 2 dimensions: 1.1 x 1.4 cm, previously, 1.4 x 1.1 x 1.3 cm   Composition: solid/almost completely solid (2)   Echogenicity: isoechoic (1)   Shape: not taller-than-wide (0)   Margins: ill-defined (0)   Echogenic foci: none (0)   ACR TI-RADS total points: 3.   ACR TI-RADS risk category:  TR3 (3 points).   Significant change in size (>/= 20% in two dimensions and minimal increase of 2 mm): No   Change in features: Yes   Change in ACR TI-RADS risk category: Yes   ACR TI-RADS recommendations:   *Given size (>/= 1.5 - 2.4 cm) and appearance, a follow-up ultrasound in 1 year should be considered based on TI-RADS criteria.    _________________________________________________________   Nodule 2: 1.2 x 0.8 x 1.0 cm heterogeneous region in the mid right thyroid lobe is favored to be pseudo nodule given lack of defined margins.   _________________________________________________________   Nodule 3: 1.1 x 0.6 x 0.8 cm solid isoechoic nodule in the mid right thyroid lobe does not meet criteria for imaging surveillance or FNA.   _________________________________________________________   Nodule 4: 0.6 x 0.5 x 0.5 cm echogenic nodule in the inferior right thyroid lobe does not meet criteria for imaging surveillance or FNA.   _________________________________________________________   Nodule 5: 0.8 x 0.7 x 1.1 cm mixed solid cystic nodule in the superior left thyroid lobe does not meet criteria for imaging surveillance or FNA.   _________________________________________________________   Nodule 6: 1.0 x 0.6 x 0.8 cm solid isoechoic left mid thyroid nodule does not meet criteria for imaging surveillance or FNA.   _________________________________________________________   Nodule 7: 0.9 x 0.9 x 1.0 cm cystic nodule in the inferior left thyroid lobe does not meet criteria for imaging surveillance or FNA.   IMPRESSION: 1. Nodule 1 (TI-RADS 3), measuring 1.5 cm, located in the superior right thyroid lobe is not significantly changed in size since prior examination and still meets criteria for imaging follow-up. Annual ultrasound surveillance is recommended until 5 years of stability is documented. 2. Previously seen nodule 4 in the inferior right thyroid lobe is not identified on the current examination. 3. Remaining bilateral thyroid nodules do not meet criteria for FNA or imaging follow-up.   ASSESSMENT / PLAN / RECOMMENDATIONS:   Hypercalcemia secondary to primary hyperparathyroidism:  -Patient is asymptomatic  -She does qualify for surgical parathyroidectomy due to osteoporosis, patient declined  surgical intervention  -24-hour urinary calcium excretion normal to 249.2 Mg 05/2018 and 204 Mg 05/2022   Recommendations Encourage hydration Avoid over-the-counter calcium and any calcium containing products such as Tums Consume 2-3 servings of dietary calcium on daily basis Continue vitamin D 2000 IUs daily  2. Osteoporosis :   - It is difficult to asceratin how much of this, is postmenopausal process vs primary hyperparathyroidism - She has been on Alendronate since 2019  - Will repeat DXA 2024    Medication  Continue alendronate 70 mg once a week    3. Subclinical Hyperthyroidism :  - TRAb elevated which indicated active Graves' disease but she also has MNG and could be contributing to her hyperthyroidism  - We have opted opted to treat based on age and osteoporosis  -She was on methimazole from October 2022 until May 2023 but restarted 01/2022 -TFTs are normal on today's labs, will reduce methimazole as below  Medication  methimazole 5 mg, half a tablet 4 days a week     4. Multinodular Goiter :   - Thyroid ultrasound 05/2021 showed MNG  -Repeat thyroid ultrasound 06/2022 shows stability    Follow-up in 6 months     Signed electronically by: Lyndle Herrlich, MD   [facility]  [facility] [address] [place] [place], [place] [address] Phone: [phone] FAX: [phone]   CC: Evaristo Bury, NP [address] [place] [address] Phone: [phone] Fax: [phone]

## 2022-12-15 LAB — BASIC METABOLIC PANEL
Calcium: 10.8 mg/dL — ABNORMAL HIGH (ref 8.7–10.3)
Glucose: 117 mg/dL — ABNORMAL HIGH (ref 70–99)
Sodium: 140 mmol/L (ref 134–144)

## 2022-12-15 LAB — CALCIUM, IONIZED

## 2022-12-17 LAB — BASIC METABOLIC PANEL
BUN/Creatinine Ratio: 11 — ABNORMAL LOW (ref 12–28)
BUN: 8 mg/dL (ref 8–27)
CO2: 23 mmol/L (ref 20–29)
Chloride: 103 mmol/L (ref 96–106)
Creatinine, Ser: 0.75 mg/dL (ref 0.57–1.00)
Potassium: 4.5 mmol/L (ref 3.5–5.2)
eGFR: 83 mL/min/{1.73_m2} (ref >59)

## 2022-12-17 LAB — PARATHYROID HORMONE, INTACT (NO CA): PTH: 76 pg/mL — ABNORMAL HIGH (ref 15–65)

## 2022-12-17 LAB — TSH: TSH: 0.752 u[IU]/mL (ref 0.450–4.500)

## 2022-12-17 LAB — ALBUMIN: Albumin: 4 g/dL (ref 3.8–4.8)

## 2022-12-17 LAB — VITAMIN D 25 HYDROXY (VIT D DEFICIENCY, FRACTURES): Vit D, 25-Hydroxy: 38.6 ng/mL (ref 30.0–100.0)

## 2022-12-17 LAB — T4, FREE: Free T4: 0.99 ng/dL (ref 0.82–1.77)

## 2022-12-17 MED ORDER — METHIMAZOLE 5 MG PO TABS: 2.5000 mg | ORAL_TABLET | ORAL | 2 refills | Status: DC

## 2023-01-22 DIAGNOSIS — M810 Age-related osteoporosis without current pathological fracture: Secondary | ICD-10-CM | POA: Diagnosis not present

## 2023-01-22 DIAGNOSIS — Z1231 Encounter for screening mammogram for malignant neoplasm of breast: Secondary | ICD-10-CM | POA: Diagnosis not present

## 2023-01-22 LAB — HM DEXA SCAN

## 2023-02-04 ENCOUNTER — Encounter: Payer: Self-pay | Admitting: Internal Medicine

## 2023-02-08 ENCOUNTER — Encounter: Payer: Self-pay | Admitting: Internal Medicine

## 2023-03-24 ENCOUNTER — Other Ambulatory Visit: Payer: Self-pay | Admitting: Internal Medicine

## 2023-05-31 ENCOUNTER — Encounter: Payer: Self-pay | Admitting: Family

## 2023-05-31 ENCOUNTER — Ambulatory Visit (INDEPENDENT_AMBULATORY_CARE_PROVIDER_SITE_OTHER): Payer: Medicare PPO | Admitting: Family

## 2023-05-31 VITALS — BP 140/70 | HR 47 | Resp 18 | Ht 64.0 in | Wt 146.6 lb

## 2023-05-31 DIAGNOSIS — E782 Mixed hyperlipidemia: Secondary | ICD-10-CM

## 2023-05-31 DIAGNOSIS — Z0000 Encounter for general adult medical examination without abnormal findings: Secondary | ICD-10-CM

## 2023-05-31 DIAGNOSIS — Z13220 Encounter for screening for lipoid disorders: Secondary | ICD-10-CM

## 2023-05-31 DIAGNOSIS — I10 Essential (primary) hypertension: Secondary | ICD-10-CM

## 2023-05-31 DIAGNOSIS — R7309 Other abnormal glucose: Secondary | ICD-10-CM

## 2023-05-31 DIAGNOSIS — K219 Gastro-esophageal reflux disease without esophagitis: Secondary | ICD-10-CM

## 2023-05-31 LAB — CBC WITH DIFFERENTIAL/PLATELET
Basophils Absolute: 0 10*3/uL (ref 0.0–0.1)
Basophils Relative: 0.5 % (ref 0.0–3.0)
Eosinophils Absolute: 0.2 10*3/uL (ref 0.0–0.7)
Eosinophils Relative: 3.1 % (ref 0.0–5.0)
HCT: 44.3 % (ref 36.0–46.0)
Hemoglobin: 14.3 g/dL (ref 12.0–15.0)
Lymphocytes Relative: 35.1 % (ref 12.0–46.0)
Lymphs Abs: 2.4 10*3/uL (ref 0.7–4.0)
MCHC: 32.3 g/dL (ref 30.0–36.0)
MCV: 93.9 fL (ref 78.0–100.0)
Monocytes Absolute: 0.6 10*3/uL (ref 0.1–1.0)
Monocytes Relative: 8.3 % (ref 3.0–12.0)
Neutro Abs: 3.6 10*3/uL (ref 1.4–7.7)
Neutrophils Relative %: 53 % (ref 43.0–77.0)
Platelets: 263 10*3/uL (ref 150.0–400.0)
RBC: 4.72 Mil/uL (ref 3.87–5.11)
RDW: 14.2 % (ref 11.5–15.5)
WBC: 6.9 10*3/uL (ref 4.0–10.5)

## 2023-05-31 LAB — COMPREHENSIVE METABOLIC PANEL
ALT: 28 U/L (ref 0–35)
AST: 33 U/L (ref 0–37)
Albumin: 3.7 g/dL (ref 3.5–5.2)
Alkaline Phosphatase: 79 U/L (ref 39–117)
BUN: 12 mg/dL (ref 6–23)
CO2: 29 meq/L (ref 19–32)
Calcium: 10.2 mg/dL (ref 8.4–10.5)
Chloride: 104 meq/L (ref 96–112)
Creatinine, Ser: 0.85 mg/dL (ref 0.40–1.20)
GFR: 66.84 mL/min (ref >60.00)
Glucose, Bld: 124 mg/dL — ABNORMAL HIGH (ref 70–99)
Potassium: 4.7 meq/L (ref 3.5–5.1)
Sodium: 139 meq/L (ref 135–145)
Total Bilirubin: 1.4 mg/dL — ABNORMAL HIGH (ref 0.2–1.2)
Total Protein: 6.5 g/dL (ref 6.0–8.3)

## 2023-05-31 LAB — LIPID PANEL
Cholesterol: 192 mg/dL (ref 0–200)
HDL: 50.6 mg/dL (ref >39.00)
LDL Cholesterol: 115 mg/dL — ABNORMAL HIGH (ref 0–99)
NonHDL: 141.08
Total CHOL/HDL Ratio: 4
Triglycerides: 131 mg/dL (ref 0.0–149.0)
VLDL: 26.2 mg/dL (ref 0.0–40.0)

## 2023-05-31 LAB — MICROALBUMIN / CREATININE URINE RATIO
Creatinine,U: 62.5 mg/dL
Microalb Creat Ratio: 1.1 mg/g (ref 0.0–30.0)
Microalb, Ur: <0.7 mg/dL (ref 0.0–1.9)

## 2023-05-31 LAB — HEMOGLOBIN A1C: Hgb A1c MFr Bld: 6.6 % — ABNORMAL HIGH (ref 4.6–6.5)

## 2023-05-31 MED ORDER — EZETIMIBE 10 MG PO TABS: 10.0000 mg | ORAL_TABLET | Freq: Every day | ORAL | 3 refills | Status: DC

## 2023-05-31 MED ORDER — LEVOCETIRIZINE DIHYDROCHLORIDE 5 MG PO TABS: 5.0000 mg | ORAL_TABLET | Freq: Every evening | ORAL | 6 refills | Status: DC

## 2023-05-31 MED ORDER — DICLOFENAC SODIUM 1 % EX GEL: 4.0000 g | Freq: Four times a day (QID) | CUTANEOUS | 0 refills | Status: DC | PRN

## 2023-05-31 MED ORDER — ALPRAZOLAM 0.5 MG PO TABS: 0.5000 mg | ORAL_TABLET | Freq: Every day | ORAL | 0 refills | Status: DC | PRN

## 2023-05-31 MED ORDER — AZELASTINE HCL 0.1 % NA SOLN: 2.0000 | Freq: Two times a day (BID) | NASAL | 2 refills | Status: DC

## 2023-05-31 MED ORDER — METOPROLOL TARTRATE 50 MG PO TABS: 50.0000 mg | ORAL_TABLET | Freq: Two times a day (BID) | ORAL | 3 refills | Status: DC

## 2023-05-31 MED ORDER — CLONIDINE HCL 0.1 MG PO TABS
0.1000 mg | ORAL_TABLET | Freq: Two times a day (BID) | ORAL | 3 refills | Status: DC
Start: 2023-05-31 — End: 2024-05-01

## 2023-05-31 MED ORDER — ESOMEPRAZOLE MAGNESIUM 40 MG PO CPDR: 40.0000 mg | DELAYED_RELEASE_CAPSULE | Freq: Every day | ORAL | 3 refills | Status: AC

## 2023-05-31 MED ORDER — AMLODIPINE BESYLATE 5 MG PO TABS: 5.0000 mg | ORAL_TABLET | Freq: Every day | ORAL | 0 refills | Status: DC

## 2023-05-31 MED ORDER — LISINOPRIL 20 MG PO TABS: 20.0000 mg | ORAL_TABLET | Freq: Two times a day (BID) | ORAL | 3 refills | Status: DC

## 2023-05-31 NOTE — Progress Notes (Signed)
Courtney Russell is a 77 y.o. female with the following history as recorded in Epic[employer]:  Patient Active Problem List   Diagnosis Date Noted   Primary hyperparathyroidism (HCC) 12/13/2021   Age-related osteoporosis without current pathological fracture 12/13/2021   Hamstring tendinitis at origin 05/23/2021   Osteoporosis without current pathological fracture 09/05/2018   Hyperparathyroidism (HCC) 09/05/2018   Vitamin D deficiency 10/01/2016   Vaginal vault prolapse 02/16/2016   Heart murmur 02/14/2016   Routine general medical examination at a health care facility 09/27/2015   Encounter for general adult medical examination with abnormal findings 09/27/2015   Urinary urgency 09/27/2015   Hypercalcemia 09/22/2013   Nonspecific abnormal electrocardiogram (ECG) (EKG) 09/18/2012   Irritable bowel disease 11/22/2010   NIPPLE DISCHARGE 08/29/2010   ANXIETY STATE, UNSPECIFIED 04/26/2010   GERD 04/26/2010   Type 2 diabetes mellitus (HCC) 04/21/2009   POSTMENOPAUSAL SYNDROME 04/21/2009   NONSPEC ELEVATION OF LEVELS OF TRANSAMINASE/LDH 04/21/2009   Hyperlipidemia 03/24/2007   Essential hypertension 03/24/2007    Current Outpatient Medications  Medication Sig Dispense Refill   aspirin 81 MG tablet Take 81 mg by mouth 1 dose over 46 hours.       cholecalciferol (VITAMIN D) 1000 units tablet Vitamin D     methimazole (TAPAZOLE) 5 MG tablet TAKE 1/2 TABLET BY MOUTH 4 DAYS A WEEK ONLY 26 tablet 2   vitamin C (ASCORBIC ACID) 500 MG tablet Take 500 mg by mouth daily.     ALPRAZolam (XANAX) 0.5 MG tablet Take 1 tablet (0.5 mg total) by mouth daily as needed for anxiety. 30 tablet 0   amLODipine (NORVASC) 5 MG tablet Take 1 tablet (5 mg total) by mouth daily. Use as needed if blood pressure is above 150/90 90 tablet 0   azelastine (ASTELIN) 0.1 % nasal spray Place 2 sprays into both nostrils 2 (two) times daily. Use in each nostril as directed 30 mL 2   cloNIDine (CATAPRES) 0.1 MG tablet Take 1  tablet (0.1 mg total) by mouth 2 (two) times daily. 180 tablet 3   diclofenac Sodium (VOLTAREN) 1 % GEL Apply 4 g topically 4 (four) times daily as needed. 100 g 0   esomeprazole (NEXIUM) 40 MG capsule Take 1 capsule (40 mg total) by mouth daily at 12 noon. 90 capsule 3   ezetimibe (ZETIA) 10 MG tablet Take 1 tablet (10 mg total) by mouth daily. 90 tablet 3   levocetirizine (XYZAL) 5 MG tablet Take 1 tablet (5 mg total) by mouth every evening. 30 tablet 6   lisinopril (ZESTRIL) 20 MG tablet Take 1 tablet (20 mg total) by mouth 2 (two) times daily. 180 tablet 3   metoprolol tartrate (LOPRESSOR) 50 MG tablet Take 1 tablet (50 mg total) by mouth 2 (two) times daily. 180 tablet 3   No current facility-administered medications for this visit.    Allergies: Dexbromphen-acetaminophen, Fish oil, Codeine, Bisoprolol-hydrochlorothiazide, Crestor [rosuvastatin], Ezetimibe-simvastatin, Pravastatin, Hydrochlorothiazide, and Other  Past Medical History:  Diagnosis Date   Anxiety    Diabetes mellitus without complication (HCC)    GERD (gastroesophageal reflux disease)    Heart murmur    High cholesterol    Hypertension    IBS (irritable bowel syndrome)    Palpitations    Prediabetes     Past Surgical History:  Procedure Laterality Date   ABDOMINAL HYSTERECTOMY  1992   fibroids   BILATERAL SALPINGOOPHORECTOMY  1996   cysts   BREAST SURGERY     duct excision   CHOLECYSTECTOMY  COLONOSCOPY  1998, 2008   negative;Dr Juanda Chance   HERNIA REPAIR     TONSILLECTOMY     TUBAL LIGATION      Family History  Problem Relation Age of Onset   Cancer Father        COLON & PROSTATE   Diabetes Father    Alzheimer's disease Father    Heart attack Father        MI in late 11s; CABG @ 42   Stroke Father        in 81s   Dementia Mother    Transient ischemic attack Mother        TIAs; AVM leaked in 32s   Colon polyps Mother    Coronary artery disease Sister        stent @ 29   Breast cancer Sister     Diabetes Paternal Aunt     Social History   Tobacco Use   Smoking status: Never   Smokeless tobacco: Never  Substance Use Topics   Alcohol use: No    Subjective:   Presents for yearly follow up; no acute concerns; continuing to work with endocrine regularly for hypercalcemia/ osteoporosis/ subclinical hyperthyroidism;   Objective:  Vitals:   05/31/23 0831 05/31/23 0948  BP: (!) 142/88 (!) 140/70  Pulse: (!) 47   Resp: 18   SpO2: 95%   Weight: 146 lb 9.6 oz (66.5 kg)   Height: 5\' 4"  (1.626 m)     General: Well developed, well nourished, in no acute distress  Skin : Warm and dry.  Head: Normocephalic and atraumatic  Eyes: Sclera and conjunctiva clear; pupils round and reactive to light; extraocular movements intact  Ears: External normal; canals clear; tympanic membranes normal  Oropharynx: Pink, supple. No suspicious lesions  Neck: Supple without thyromegaly, adenopathy  Lungs: Respirations unlabored; clear to auscultation bilaterally without wheeze, rales, rhonchi  CVS exam: normal rate and regular rhythm.  Abdomen: Soft; nontender; nondistended; normoactive bowel sounds; no masses or hepatosplenomegaly  Musculoskeletal: No deformities; no active joint inflammation  Extremities: No edema, cyanosis, clubbing  Vessels: Symmetric bilaterally  Neurologic: Alert and oriented; speech intact; face symmetrical; moves all extremities well; CNII-XII intact without focal deficit   Assessment:  1. PE (physical exam), annual   2. Lipid screening   3. Elevated glucose   4. Essential hypertension   5. Gastroesophageal reflux disease without esophagitis   6. Mixed hyperlipidemia     Plan:  Age appropriate preventive healthcare needs addressed; encouraged regular eye doctor and dental exams; encouraged regular exercise; will update labs and refills as needed today; continue to work with endocrine for regular follow up; follow up in 1 year, sooner prn.    No follow-ups on file.   Orders Placed This Encounter  Procedures   CBC with Differential/Platelet   Comp Met (CMET)   Lipid panel   Hemoglobin A1c   Urine Microalbumin w/creat. ratio    Requested Prescriptions   Signed Prescriptions Disp Refills   amLODipine (NORVASC) 5 MG tablet 90 tablet 0    Sig: Take 1 tablet (5 mg total) by mouth daily. Use as needed if blood pressure is above 150/90   azelastine (ASTELIN) 0.1 % nasal spray 30 mL 2    Sig: Place 2 sprays into both nostrils 2 (two) times daily. Use in each nostril as directed   cloNIDine (CATAPRES) 0.1 MG tablet 180 tablet 3    Sig: Take 1 tablet (0.1 mg total) by mouth 2 (two) times daily.  diclofenac Sodium (VOLTAREN) 1 % GEL 100 g 0    Sig: Apply 4 g topically 4 (four) times daily as needed.   esomeprazole (NEXIUM) 40 MG capsule 90 capsule 3    Sig: Take 1 capsule (40 mg total) by mouth daily at 12 noon.   ezetimibe (ZETIA) 10 MG tablet 90 tablet 3    Sig: Take 1 tablet (10 mg total) by mouth daily.   levocetirizine (XYZAL) 5 MG tablet 30 tablet 6    Sig: Take 1 tablet (5 mg total) by mouth every evening.   lisinopril (ZESTRIL) 20 MG tablet 180 tablet 3    Sig: Take 1 tablet (20 mg total) by mouth 2 (two) times daily.   metoprolol tartrate (LOPRESSOR) 50 MG tablet 180 tablet 3    Sig: Take 1 tablet (50 mg total) by mouth 2 (two) times daily.   ALPRAZolam (XANAX) 0.5 MG tablet 30 tablet 0    Sig: Take 1 tablet (0.5 mg total) by mouth daily as needed for anxiety.

## 2023-06-17 ENCOUNTER — Ambulatory Visit: Payer: Medicare PPO | Admitting: Internal Medicine

## 2023-06-17 ENCOUNTER — Encounter: Payer: Self-pay | Admitting: Internal Medicine

## 2023-06-17 VITALS — BP 122/80 | HR 66 | Ht 64.0 in | Wt 146.0 lb

## 2023-06-17 DIAGNOSIS — E0590 Thyrotoxicosis, unspecified without thyrotoxic crisis or storm: Secondary | ICD-10-CM

## 2023-06-17 DIAGNOSIS — E210 Primary hyperparathyroidism: Secondary | ICD-10-CM

## 2023-06-17 DIAGNOSIS — M810 Age-related osteoporosis without current pathological fracture: Secondary | ICD-10-CM | POA: Diagnosis not present

## 2023-06-17 DIAGNOSIS — E042 Nontoxic multinodular goiter: Secondary | ICD-10-CM

## 2023-06-17 LAB — TSH: TSH: 1.27 u[IU]/mL (ref 0.35–5.50)

## 2023-06-17 LAB — T4, FREE: Free T4: 0.65 ng/dL (ref 0.60–1.60)

## 2023-06-17 LAB — VITAMIN D 25 HYDROXY (VIT D DEFICIENCY, FRACTURES): VITD: 43.08 ng/mL (ref 30.00–100.00)

## 2023-06-17 MED ORDER — ALENDRONATE SODIUM 70 MG PO TABS: 70.0000 mg | ORAL_TABLET | ORAL | 3 refills | Status: DC

## 2023-06-17 NOTE — Patient Instructions (Signed)
Stay hydrated Avoid over-the-counter calcium tablets Make sure you are consuming 2-3 servings of dietary calcium daily Start alendronate ( Fosamax)  70 mg once weekly

## 2023-06-17 NOTE — Progress Notes (Unsigned)
Name: Courtney Russell  MRN/ DOB: [id number], 1945-08-23    Age/ Sex: 77 y.o., female     PCP: Olive Bass, FNP   Reason for Endocrinology Evaluation:  Primary hyperparathyroidism     Initial Endocrinology Clinic Visit:  06/05/2018    PATIENT IDENTIFIER: Courtney Russell is a 77 y.o., female with a past medical history of hypertension and dyslipidemia. She has followed with [facility] since 06/05/2018 for consultative assistance with management of her hyperparathyroidism  HISTORICAL SUMMARY:  Mrs. Wait has been diagnosed with hypercalcemia approximately in 2009, she has been asymptomatic all these years.In review of her records she has had intermittent hypercalcemia since 2015 with a serum calcium of 10.7 mg/dL (corrected 40.98) She denies any history of HCTZ or lithium use    24-hr urinary excretion of calcium 249.2 in 05/2018,calcium creatinine ratio of 0.029 which is consistent with primary hyperparathyroidism    DXA - Osteoporosis 06/2018 started on Alendronate    She was lost to follow up from 08/2018 until her return in 04/2021   No family history of hyperparathyroidism, but both parents have history of kidney stones     THYROID HISTORY:  She was noted with a suppressed TSH at 0.01 uIU/mL during routine labs in 04/2021 .She was started on methimazole at the time, we stopped it in 11/2021 but had to restart a smaller dose by 01/2022   SUBJECTIVE:    Today (06/17/2023):  [name] is here for a follow-up on hyperparathyroidism, MNG, hyperthyroidism and osteoporosis  Denies polyuria and polydipsia  Has chronic heartburn , which has improved  No constipation or diarrhea  Denies local neck symptoms  Has rare  palpitations  Denies renal stones  No recent falls   Has been out of Alendronate for a while   She is on Vitamin D 2000 iu daily  Alendronate 70 mg once a week - not taking  Methimazole 5 mg, half a tablet 4 days a  week       HISTORY:  Past Medical History:  Past Medical History:  Diagnosis Date   Anxiety    Diabetes mellitus without complication (HCC)    GERD (gastroesophageal reflux disease)    Heart murmur    High cholesterol    Hypertension    IBS (irritable bowel syndrome)    Palpitations    Prediabetes    Past Surgical History:  Past Surgical History:  Procedure Laterality Date   ABDOMINAL HYSTERECTOMY  1992   fibroids   BILATERAL SALPINGOOPHORECTOMY  1996   cysts   BREAST SURGERY     duct excision   CHOLECYSTECTOMY     COLONOSCOPY  1998, 2008   negative;Dr Juanda Chance   HERNIA REPAIR     TONSILLECTOMY     TUBAL LIGATION     Social History:  reports that she has never smoked. She has never used smokeless tobacco. She reports that she does not drink alcohol and does not use drugs. Family History: family history includes Alzheimer's disease in her father; Breast cancer in her sister; Cancer in her father; Colon polyps in her mother; Coronary artery disease in her sister; Dementia in her mother; Diabetes in her father and paternal aunt; Heart attack in her father; Stroke in her father; Transient ischemic attack in her mother.   HOME MEDICATIONS: Allergies as of 06/17/2023       Reactions   Dexbromphen-acetaminophen Other (See Comments)   Tachycardia, and leg numbness Tachycardia, and leg  numbness   Fish Oil    REACTION: rash REACTION: rash   Codeine Other (See Comments)   Mental status changes Mental status changes   Bisoprolol-hydrochlorothiazide Other (See Comments)   Dropped potassium level,hypokalemia Dropped potassium level,hypokalemia   Crestor [rosuvastatin]    Elevated LFTs   Ezetimibe-simvastatin Other (See Comments)   Elevated LFTs Makes liver enzymes too high Elevated LFTs Makes liver enzymes too high   Pravastatin    palpitations    Hydrochlorothiazide    REACTION: low potassium level REACTION: low potassium level   Other Palpitations   dryx  oil dryx oil        Medication List        Accurate as of June 17, 2023 11:08 AM. If you have any questions, ask your nurse or doctor.          ALPRAZolam 0.5 MG tablet Commonly known as: Xanax Take 1 tablet (0.5 mg total) by mouth daily as needed for anxiety.   amLODipine 5 MG tablet Commonly known as: NORVASC Take 1 tablet (5 mg total) by mouth daily. Use as needed if blood pressure is above 150/90   ascorbic acid 500 MG tablet Commonly known as: VITAMIN C Take 500 mg by mouth daily.   aspirin 81 MG tablet Take 81 mg by mouth 1 dose over 46 hours.   azelastine 0.1 % nasal spray Commonly known as: ASTELIN Place 2 sprays into both nostrils 2 (two) times daily. Use in each nostril as directed   cholecalciferol 1000 units tablet Commonly known as: VITAMIN D Vitamin D   cloNIDine 0.1 MG tablet Commonly known as: CATAPRES Take 1 tablet (0.1 mg total) by mouth 2 (two) times daily.   diclofenac Sodium 1 % Gel Commonly known as: VOLTAREN Apply 4 g topically 4 (four) times daily as needed.   esomeprazole 40 MG capsule Commonly known as: NEXIUM Take 1 capsule (40 mg total) by mouth daily at 12 noon.   ezetimibe 10 MG tablet Commonly known as: Zetia Take 1 tablet (10 mg total) by mouth daily.   levocetirizine 5 MG tablet Commonly known as: XYZAL Take 1 tablet (5 mg total) by mouth every evening.   lisinopril 20 MG tablet Commonly known as: ZESTRIL Take 1 tablet (20 mg total) by mouth 2 (two) times daily.   methimazole 5 MG tablet Commonly known as: TAPAZOLE TAKE 1/2 TABLET BY MOUTH 4 DAYS A WEEK ONLY   metoprolol tartrate 50 MG tablet Commonly known as: LOPRESSOR Take 1 tablet (50 mg total) by mouth 2 (two) times daily.          OBJECTIVE:   PHYSICAL EXAM: VS: BP 132/80 (BP Location: Left Arm, Patient Position: Sitting, Cuff Size: Large)   Pulse 66   Ht 5\' 4"  (1.626 m)   Wt 146 lb (66.2 kg)   SpO2 97%   BMI 25.06 kg/m     EXAM: General: Pt appears well and is in NAD Thyroid : NO nodules appreciated   Lungs: Clear with good BS bilat with no rales, rhonchi, or wheezes  Heart: Auscultation: RRR.  Abdomen: Normoactive bowel sounds, soft, nontender, without masses or organomegaly palpable  Extremities:  BL LE: No pretibial edema normal ROM and strength.  Mental Status: Judgment, insight: Intact Memory: Intact for recent and remote events Mood and affect: No depression, anxiety, or agitation     DATA REVIEWED:  Latest Reference Range & Units 05/31/23 09:10  Sodium 135 - 145 mEq/L 139  Potassium 3.5 - 5.1  mEq/L 4.7  Chloride 96 - 112 mEq/L 104  CO2 19 - 32 mEq/L 29  Glucose 70 - 99 mg/dL 161 (H)  BUN 6 - 23 mg/dL 12  Creatinine 0.96 - 0.45 mg/dL 4.09  Calcium 8.4 - 81.1 mg/dL 91.4  Alkaline Phosphatase 39 - 117 U/L 79  Albumin 3.5 - 5.2 g/dL 3.7  AST 0 - 37 U/L 33  ALT 0 - 35 U/L 28  Total Protein 6.0 - 8.3 g/dL 6.5  Total Bilirubin 0.2 - 1.2 mg/dL 1.4 (H)  GFR >78.29 mL/min 66.84    Latest Reference Range & Units 05/31/23 09:10  Total CHOL/HDL Ratio  4  Cholesterol 0 - 200 mg/dL 562  HDL Cholesterol >13.08 mg/dL 65.78  LDL (calc) 0 - 99 mg/dL 469 (H)  NonHDL  629.52  Triglycerides 0.0 - 149.0 mg/dL 841.3  VLDL 0.0 - 24.4 mg/dL 01.0       Latest Reference Range & Units 06/29/21 09:27  TRAB <=2.00 IU/L 3.21 (H)      DXA 01/22/2023 @ [facility]  01/22/2023 Change 2022  AP spine -1.2 N/a  RFN +0.3   Right total hip -1.7 Down 3.0%  LFN  -1.3   Left total hip -1.7 0.0%  Left 1/3rd radium  -4.1 Down 3.0%     Thyroid Ultrasound 07/02/2022    Estimated total number of nodules >/= 1 cm: 5   Number of spongiform nodules >/=  2 cm not described below (TR1): 0   Number of mixed cystic and solid nodules >/= 1.5 cm not described below (TR2): 0   _________________________________________________________   Nodule # 1:   Prior biopsy: No   Location: Right; superior   Maximum size:  1.5 cm; Other 2 dimensions: 1.1 x 1.4 cm, previously, 1.4 x 1.1 x 1.3 cm   Composition: solid/almost completely solid (2)   Echogenicity: isoechoic (1)   Shape: not taller-than-wide (0)   Margins: ill-defined (0)   Echogenic foci: none (0)   ACR TI-RADS total points: 3.   ACR TI-RADS risk category:  TR3 (3 points).   Significant change in size (>/= 20% in two dimensions and minimal increase of 2 mm): No   Change in features: Yes   Change in ACR TI-RADS risk category: Yes   ACR TI-RADS recommendations:   *Given size (>/= 1.5 - 2.4 cm) and appearance, a follow-up ultrasound in 1 year should be considered based on TI-RADS criteria.   _________________________________________________________   Nodule 2: 1.2 x 0.8 x 1.0 cm heterogeneous region in the mid right thyroid lobe is favored to be pseudo nodule given lack of defined margins.   _________________________________________________________   Nodule 3: 1.1 x 0.6 x 0.8 cm solid isoechoic nodule in the mid right thyroid lobe does not meet criteria for imaging surveillance or FNA.   _________________________________________________________   Nodule 4: 0.6 x 0.5 x 0.5 cm echogenic nodule in the inferior right thyroid lobe does not meet criteria for imaging surveillance or FNA.   _________________________________________________________   Nodule 5: 0.8 x 0.7 x 1.1 cm mixed solid cystic nodule in the superior left thyroid lobe does not meet criteria for imaging surveillance or FNA.   _________________________________________________________   Nodule 6: 1.0 x 0.6 x 0.8 cm solid isoechoic left mid thyroid nodule does not meet criteria for imaging surveillance or FNA.   _________________________________________________________   Nodule 7: 0.9 x 0.9 x 1.0 cm cystic nodule in the inferior left thyroid lobe does not meet criteria for imaging surveillance or FNA.  IMPRESSION: 1. Nodule 1 (TI-RADS 3), measuring 1.5 cm,  located in the superior right thyroid lobe is not significantly changed in size since prior examination and still meets criteria for imaging follow-up. Annual ultrasound surveillance is recommended until 5 years of stability is documented. 2. Previously seen nodule 4 in the inferior right thyroid lobe is not identified on the current examination. 3. Remaining bilateral thyroid nodules do not meet criteria for FNA or imaging follow-up.   ASSESSMENT / PLAN / RECOMMENDATIONS:   Hypercalcemia secondary to primary hyperparathyroidism:  -Patient is asymptomatic  -She does qualify for surgical parathyroidectomy due to osteoporosis, patient declined surgical intervention in the past  -24-hour urinary calcium excretion normal to 249.2 Mg 05/2018,  and 204 Mg 05/2022 -Repeat serum calcium is elevated, as well as PTH with normal vitamin D  Recommendations Encourage hydration Avoid over-the-counter calcium tablets  Consume 2-3 servings of dietary calcium on daily basis Continue vitamin D 2000 IUs daily    2. Osteoporosis :   - It is difficult to asceratin how much of this, is postmenopausal process vs primary hyperparathyroidism -I have attempted to prescribe alendronate and 2019 but she has not been consistent with this, due to skepticism regarding side effects, patient states that her mother and husband had drop in on Fosamax, we discussed small risk of osteonecrosis -We also discussed alternative therapy to include Prolia versus Evenity versus zoledronic acid -Patient opted to try alendronate again -We again discussed parathyroidectomy to improve BMD but not necessarily resolve osteoporosis.   Medication  Restart alendronate 70 mg once a week at this time   3. Subclinical Hyperthyroidism :  - TRAb elevated which indicated active Graves' disease but she also has MNG and could be contributing to her hyperthyroidism  - We have opted opted to treat based on age and osteoporosis  -She  was on methimazole from October 2022 until May 2023 but restarted 01/2022 -TFTs ****    Medication Methimazole 5 mg, half a tablet 4 days a week     4. Multinodular Goiter :  -No local neck symptoms - Thyroid ultrasound 05/2021 showed MNG  -Will repeat again this year    Follow-up in 6 months     Signed electronically by: Lyndle Herrlich, MD   [facility]  [facility] [address] [place] [place], [place] [address] Phone: [phone] FAX: [phone]   CC: Evaristo Bury, NP [address] [place] [address] Phone: [phone] Fax: [phone]

## 2023-06-18 ENCOUNTER — Telehealth: Payer: Self-pay | Admitting: Internal Medicine

## 2023-06-18 LAB — PARATHYROID HORMONE, INTACT (NO CA): PTH: 83 pg/mL — ABNORMAL HIGH (ref 16–77)

## 2023-06-18 LAB — BASIC METABOLIC PANEL
BUN/Creatinine Ratio: 19 (ref 12–28)
BUN: 13 mg/dL (ref 8–27)
CO2: 22 mmol/L (ref 20–29)
Calcium: 10.7 mg/dL — ABNORMAL HIGH (ref 8.7–10.3)
Chloride: 106 mmol/L (ref 96–106)
Creatinine, Ser: 0.68 mg/dL (ref 0.57–1.00)
Glucose: 121 mg/dL — ABNORMAL HIGH (ref 70–99)
Potassium: 4.6 mmol/L (ref 3.5–5.2)
Sodium: 142 mmol/L (ref 134–144)
eGFR: 91 mL/min/{1.73_m2} (ref >59)

## 2023-06-18 LAB — ALBUMIN: Albumin: 3.8 g/dL (ref 3.8–4.8)

## 2023-06-18 LAB — CALCIUM, IONIZED: Calcium, Ion: 5.7 mg/dL — ABNORMAL HIGH (ref 4.7–5.5)

## 2023-06-18 NOTE — Telephone Encounter (Signed)
Please let the patient know that her thyroid test remains within normal range and I would recommend STOPPING methimazole  Please schedule the patient for repeat thyroid function in 2 months   Calcium remains slightly elevated with normal kidney function and vitamin D  Thanks

## 2023-06-18 NOTE — Telephone Encounter (Signed)
Patient aware and lab visit has been scheduled. Patient would like [place] order to [facility] to [facility].  Patient also states that the area where she had blood drawn looks much better today and she thinks it's fine.

## 2023-06-18 NOTE — Addendum Note (Signed)
Addended by: Scarlette Shorts on: 06/18/2023 12:17 PM   Modules accepted: Orders

## 2023-07-01 ENCOUNTER — Ambulatory Visit (HOSPITAL_BASED_OUTPATIENT_CLINIC_OR_DEPARTMENT_OTHER): Admission: RE | Admit: 2023-07-01 | Payer: Medicare PPO | Source: Ambulatory Visit

## 2023-07-03 ENCOUNTER — Ambulatory Visit (HOSPITAL_BASED_OUTPATIENT_CLINIC_OR_DEPARTMENT_OTHER)
Admission: RE | Admit: 2023-07-03 | Discharge: 2023-07-03 | Disposition: A | Discharge status: Home / Self Care | Payer: Medicare PPO | Source: Ambulatory Visit | Attending: Internal Medicine | Admitting: Internal Medicine

## 2023-07-03 DIAGNOSIS — E042 Nontoxic multinodular goiter: Secondary | ICD-10-CM | POA: Diagnosis not present

## 2023-07-25 ENCOUNTER — Other Ambulatory Visit: Payer: Self-pay | Admitting: Family

## 2023-08-13 ENCOUNTER — Telehealth: Payer: Self-pay | Admitting: Internal Medicine

## 2023-08-13 NOTE — Telephone Encounter (Signed)
 Lab orders have been sent to [facility] on [address] at [phone]

## 2023-08-13 NOTE — Telephone Encounter (Signed)
 Patient called and requested that her labs be faxed to the [facility] at [address] in [place] (near her home) - any questions call [phone]

## 2023-08-16 ENCOUNTER — Other Ambulatory Visit: Payer: Self-pay | Admitting: Internal Medicine

## 2023-08-16 ENCOUNTER — Other Ambulatory Visit: Payer: Self-pay

## 2023-08-16 DIAGNOSIS — E0590 Thyrotoxicosis, unspecified without thyrotoxic crisis or storm: Secondary | ICD-10-CM

## 2023-08-18 LAB — TSH: TSH: 0.053 u[IU]/mL — ABNORMAL LOW (ref 0.450–4.500)

## 2023-08-18 LAB — T3, FREE: T3, Free: 3.8 pg/mL (ref 2.0–4.4)

## 2023-08-18 LAB — T4, FREE: Free T4: 1.02 ng/dL (ref 0.82–1.77)

## 2023-08-19 ENCOUNTER — Encounter: Payer: Self-pay | Admitting: Internal Medicine

## 2023-08-19 MED ORDER — METHIMAZOLE 5 MG PO TABS: 5.0000 mg | ORAL_TABLET | ORAL | 2 refills | Status: DC

## 2023-08-19 NOTE — Addendum Note (Signed)
Addended by: Scarlette Shorts on: 08/19/2023 03:57 PM   Modules accepted: Orders

## 2023-08-20 ENCOUNTER — Other Ambulatory Visit: Payer: Medicare PPO

## 2023-11-26 ENCOUNTER — Ambulatory Visit (INDEPENDENT_AMBULATORY_CARE_PROVIDER_SITE_OTHER): Payer: Medicare PPO

## 2023-11-26 VITALS — Ht 64.0 in | Wt 146.0 lb

## 2023-11-26 DIAGNOSIS — Z0000 Encounter for general adult medical examination without abnormal findings: Secondary | ICD-10-CM

## 2023-11-26 NOTE — Patient Instructions (Addendum)
 Courtney Russell , Thank you for taking time to come for your Medicare Wellness Visit. I appreciate your ongoing commitment to your health goals. Please review the following plan we discussed and let me know if I can assist you in the future.   Referrals/Orders/Follow-Ups/Clinician Recommendations:   This is a list of the screening recommended for you and due dates:  Health Maintenance  Topic Date Due   DTaP/Tdap/Td vaccine (2 - Tdap) 04/22/2019   Complete foot exam   04/30/2020   COVID-19 Vaccine (4 - 2024-25 season) 03/31/2023   Hemoglobin A1C  11/28/2023   Flu Shot  02/28/2024   Yearly kidney health urinalysis for diabetes  05/30/2024   Yearly kidney function blood test for diabetes  06/16/2024   Eye exam for diabetics  09/27/2024   Medicare Annual Wellness Visit  11/25/2024   Pneumonia Vaccine  Completed   DEXA scan (bone density measurement)  Completed   Hepatitis C Screening  Completed   Zoster (Shingles) Vaccine  Completed   HPV Vaccine  Aged Out   Meningitis B Vaccine  Aged Out   Cologuard (Stool DNA test)  Discontinued    Advanced directives: (Declined) Advance directive discussed with you today. Even though you declined this today, please call our office should you change your mind, and we can give you the proper paperwork for you to fill out.  Next Medicare Annual Wellness Visit scheduled for next year: Yes

## 2023-11-26 NOTE — Progress Notes (Signed)
 Subjective:   Courtney Russell is a 78 y.o. who presents for a Medicare Wellness preventive visit.  Visit Complete: Virtual I connected with  Bard Leyland on 11/26/23 by a audio enabled telemedicine application and verified that I am speaking with the correct person using two identifiers.  Patient Location: Home  Provider Location: Home Office  I discussed the limitations of evaluation and management by telemedicine. The patient expressed understanding and agreed to proceed.  Vital Signs: Because this visit was a virtual/telehealth visit, some criteria may be missing or patient reported. Any vitals not documented were not able to be obtained and vitals that have been documented are patient reported.    Persons Participating in Visit: Patient.  AWV Questionnaire: Yes: Patient Medicare AWV questionnaire was completed by the patient on 11/19/23; I have confirmed that all information answered by patient is correct and no changes since this date.  Cardiac Risk Factors include: advanced age (>66men, >71 women);diabetes mellitus;hypertension     Objective:    Today's Vitals   11/26/23 1432  Weight: 146 lb (66.2 kg)  Height: 5\' 4"  (1.626 m)   Body mass index is 25.06 kg/m.     11/26/2023    2:39 PM 11/08/2022   10:20 AM 11/02/2021   11:25 AM 07/16/2018    9:31 AM  Advanced Directives  Does Patient Have a Medical Advance Directive? No No No No  Would patient like information on creating a medical advance directive? No - Patient declined No - Patient declined No - Patient declined No - Patient declined    Current Medications (verified) Outpatient Encounter Medications as of 11/26/2023  Medication Sig   methimazole  (TAPAZOLE ) 5 MG tablet Take 1 tablet (5 mg total) by mouth as directed. 1 tablet 3 days out of the week   alendronate  (FOSAMAX ) 70 MG tablet Take 1 tablet (70 mg total) by mouth once a week. Take with a full glass of water on an empty stomach.   ALPRAZolam  (XANAX )  0.5 MG tablet Take 1 tablet (0.5 mg total) by mouth daily as needed for anxiety.   amLODipine  (NORVASC ) 5 MG tablet TAKE 1 TABLET BY MOUTH ONCE DAILY IF  NEEDED FOR  BLOOD  PRESSURE  IS  ABOVE  150/90   aspirin 81 MG tablet Take 81 mg by mouth 1 dose over 46 hours.     azelastine  (ASTELIN ) 0.1 % nasal spray Place 2 sprays into both nostrils 2 (two) times daily. Use in each nostril as directed   cholecalciferol (VITAMIN D ) 1000 units tablet Vitamin D    cloNIDine  (CATAPRES ) 0.1 MG tablet Take 1 tablet (0.1 mg total) by mouth 2 (two) times daily.   diclofenac  Sodium (VOLTAREN ) 1 % GEL Apply 4 g topically 4 (four) times daily as needed.   esomeprazole  (NEXIUM ) 40 MG capsule Take 1 capsule (40 mg total) by mouth daily at 12 noon.   ezetimibe  (ZETIA ) 10 MG tablet Take 1 tablet (10 mg total) by mouth daily.   levocetirizine (XYZAL ) 5 MG tablet Take 1 tablet (5 mg total) by mouth every evening.   lisinopril  (ZESTRIL ) 20 MG tablet Take 1 tablet (20 mg total) by mouth 2 (two) times daily.   metoprolol  tartrate (LOPRESSOR ) 50 MG tablet Take 1 tablet (50 mg total) by mouth 2 (two) times daily.   vitamin C (ASCORBIC ACID) 500 MG tablet Take 500 mg by mouth daily.   No facility-administered encounter medications on file as of 11/26/2023.    Allergies (verified) Dexbromphen-acetaminophen, Fish  oil, Codeine, Bisoprolol-hydrochlorothiazide, Crestor  [rosuvastatin ], Ezetimibe -simvastatin, Pravastatin , Hydrochlorothiazide, and Other   History: Past Medical History:  Diagnosis Date   Anxiety    Diabetes mellitus without complication (HCC)    GERD (gastroesophageal reflux disease)    Heart murmur    High cholesterol    Hypertension    IBS (irritable bowel syndrome)    Palpitations    Prediabetes    Past Surgical History:  Procedure Laterality Date   ABDOMINAL HYSTERECTOMY  1992   fibroids   BILATERAL SALPINGOOPHORECTOMY  1996   cysts   BREAST SURGERY     duct excision   CHOLECYSTECTOMY      COLONOSCOPY  1998, 2008   negative;Dr [place] Lax   HERNIA REPAIR     TONSILLECTOMY     TUBAL LIGATION     Family History  Problem Relation Age of Onset   Cancer Father        COLON & PROSTATE   Diabetes Father    Alzheimer's disease Father    Heart attack Father        MI in late 19s; CABG @ 31   Stroke Father        in 45s   Dementia Mother    Transient ischemic attack Mother        TIAs; AVM leaked in 81s   Colon polyps Mother    Coronary artery disease Sister        stent @ 60   Breast cancer Sister    Diabetes Paternal Aunt    Social History   Socioeconomic History   Marital status: Married    Spouse name: Not on file   Number of children: 3   Years of education: 13   Highest education level: Not on file  Occupational History   Occupation: Retired  Tobacco Use   Smoking status: Never   Smokeless tobacco: Never  Substance and Sexual Activity   Alcohol use: No   Drug use: No   Sexual activity: Not on file  Other Topics Concern   Not on file  Social History Narrative   Fun: Travel and read   Denies abuse and feels safe at home.    Social Drivers of Corporate investment banker Strain: Low Risk  (11/26/2023)   Overall Financial Resource Strain (CARDIA)    Difficulty of Paying Living Expenses: Not hard at all  Food Insecurity: No Food Insecurity (11/26/2023)   Hunger Vital Sign    Worried About Running Out of Food in the Last Year: Never true    Ran Out of Food in the Last Year: Never true  Transportation Needs: No Transportation Needs (11/26/2023)   PRAPARE - Administrator, Civil Service (Medical): No    Lack of Transportation (Non-Medical): No  Physical Activity: Unknown (11/26/2023)   Exercise Vital Sign    Days of Exercise per Week: 2 days    Minutes of Exercise per Session: Not on file  Stress: Stress Concern Present (11/26/2023)   [employer] of Occupational Health - Occupational Stress Questionnaire    Feeling of Stress : To some extent   Social Connections: Unknown (11/26/2023)   Social Connection and Isolation Panel [NHANES]    Frequency of Communication with Friends and Family: More than three times a week    Frequency of Social Gatherings with Friends and Family: More than three times a week    Attends Religious Services: 1 to 4 times per year    Active Member of Clubs or  Organizations: No    Attends Engineer, structural: Not on file    Marital Status: Not on file    Tobacco Counseling Counseling given: Not Answered    Clinical Intake:  Pre-visit preparation completed: Yes  Pain : No/denies pain     BMI - recorded: 25.06 Nutritional Status: BMI 25 -29 Overweight Nutritional Risks: None Diabetes: Yes CBG done?: No Did pt. bring in CBG monitor from home?: No  Lab Results  Component Value Date   HGBA1C 6.6 (H) 05/31/2023   HGBA1C 7.0 (H) 05/25/2022   HGBA1C 6.6 (H) 05/11/2021     How often do you need to have someone help you when you read instructions, pamphlets, or other written materials from your doctor or pharmacy?: 1 - Never  Interpreter Needed?: No  Information entered by :: Farris Hong LPN   Activities of Daily Living     11/26/2023    2:38 PM 11/19/2023   11:57 PM  In your present state of health, do you have any difficulty performing the following activities:  Hearing? 0 0  Vision? 0 0  Difficulty concentrating or making decisions? 0 0  Walking or climbing stairs? 0 0  Dressing or bathing? 0 0  Doing errands, shopping? 0 0  Preparing Food and eating ? N N  Using the Toilet? N   In the past six months, have you accidently leaked urine? N N  Do you have problems with loss of bowel control? N N  Managing your Medications? N N  Managing your Finances? N N  Housekeeping or managing your Housekeeping? N N    Patient Care Team: Adra Alanis, FNP as PCP - General (Internal Medicine)  Indicate any recent Medical Services you may have received from other than  [facility] providers in the past year (date may be approximate).     Assessment:   This is a routine wellness examination for Zakiyah.  Hearing/Vision screen Hearing Screening - Comments:: Denies hearing difficulties   Vision Screening - Comments:: Wears rx glasses - up to date with routine eye exams with  [facility]   Goals Addressed               This Visit's Progress     Increase physical activity (pt-stated)        Remain active.       Depression Screen     11/26/2023    2:42 PM 05/31/2023    8:41 AM 11/08/2022   10:22 AM 05/25/2022    8:19 AM 11/02/2021   11:21 AM 06/20/2021   10:37 AM 05/23/2021    9:47 AM  PHQ 2/9 Scores  PHQ - 2 Score 0 0 0 0 0 0 0    Fall Risk     11/26/2023    2:39 PM 11/19/2023   11:57 PM 05/31/2023    8:41 AM 11/01/2022   11:32 PM 05/25/2022    8:19 AM  Fall Risk   Falls in the past year? 1 1 1 1  0  Number falls in past yr: 0 0 0 0 0  Injury with Fall? 0 0 0 0 0  Risk for fall due to : No Fall Risks  History of fall(s) No Fall Risks No Fall Risks;Other (Comment)  Follow up Falls prevention discussed;Falls evaluation completed  Falls evaluation completed Falls evaluation completed Falls evaluation completed    MEDICARE RISK AT HOME:  Medicare Risk at Home Any stairs in or around the home?: No If so,  are there any without handrails?: No Home free of loose throw rugs in walkways, pet beds, electrical cords, etc?: Yes Adequate lighting in your home to reduce risk of falls?: Yes Life alert?: No Use of a cane, walker or w/c?: No Grab bars in the bathroom?: No Shower chair or bench in shower?: No Elevated toilet seat or a handicapped toilet?: No  TIMED UP AND GO:  Was the test performed?  No  Cognitive Function: 6CIT completed        11/26/2023    2:40 PM 11/08/2022   10:27 AM 11/02/2021   11:28 AM  6CIT Screen  What Year? 0 points 0 points 0 points  What month? 0 points 0 points 0 points  What time? 0 points 0 points 0 points   Count back from 20 0 points 0 points 0 points  Months in reverse 0 points 0 points 0 points  Repeat phrase 0 points 0 points 0 points  Total Score 0 points 0 points 0 points    Immunizations Immunization History  Administered Date(s) Administered   Influenza Inj Mdck Quad Pf 05/13/2017, 05/19/2018, 04/17/2019   Influenza-Unspecified 04/21/2013, 04/29/2014   Moderna Sars-Covid-2 Vaccination 10/11/2019, 11/08/2019, 06/17/2020   Pneumococcal Conjugate-13 04/25/2015   Pneumococcal Polysaccharide-23 09/18/2012, 05/11/2021   Td 04/21/2009   Zoster Recombinant(Shingrix) 05/01/2019, 03/03/2020    Screening Tests Health Maintenance  Topic Date Due   DTaP/Tdap/Td (2 - Tdap) 04/22/2019   FOOT EXAM  04/30/2020   COVID-19 Vaccine (4 - 2024-25 season) 03/31/2023   HEMOGLOBIN A1C  11/28/2023   INFLUENZA VACCINE  02/28/2024   Diabetic kidney evaluation - Urine ACR  05/30/2024   Diabetic kidney evaluation - eGFR measurement  06/16/2024   OPHTHALMOLOGY EXAM  09/27/2024   Medicare Annual Wellness (AWV)  11/25/2024   Pneumonia Vaccine 21+ Years old  Completed   DEXA SCAN  Completed   Hepatitis C Screening  Completed   Zoster Vaccines- Shingrix  Completed   HPV VACCINES  Aged Out   Meningococcal B Vaccine  Aged Out   Fecal DNA (Cologuard)  Discontinued    Health Maintenance  Health Maintenance Due  Topic Date Due   DTaP/Tdap/Td (2 - Tdap) 04/22/2019   FOOT EXAM  04/30/2020   COVID-19 Vaccine (4 - 2024-25 season) 03/31/2023   Health Maintenance Items Addressed:   Additional Screening:  Vision Screening: Recommended annual ophthalmology exams for early detection of glaucoma and other disorders of the eye.  Dental Screening: Recommended annual dental exams for proper oral hygiene  Community Resource Referral / Chronic Care Management: CRR required this visit?  No   CCM required this visit?  No     Plan:     I have personally reviewed and noted the following in the  patient's chart:   Medical and social history Use of alcohol, tobacco or illicit drugs  Current medications and supplements including opioid prescriptions. Patient is not currently taking opioid prescriptions. Functional ability and status Nutritional status Physical activity Advanced directives List of other physicians Hospitalizations, surgeries, and ER visits in previous 12 months Vitals Screenings to include cognitive, depression, and falls Referrals and appointments  In addition, I have reviewed and discussed with patient certain preventive protocols, quality metrics, and best practice recommendations. A written personalized care plan for preventive services as well as general preventive health recommendations were provided to patient.     Dewayne Ford, LPN   2/95/6213   After Visit Summary: (MyChart) Due to this being a telephonic visit, the  after visit summary with patients personalized plan was offered to patient via MyChart   Notes: Nothing significant to report at this time.

## 2023-11-28 ENCOUNTER — Telehealth: Payer: Self-pay | Admitting: Family

## 2023-11-28 NOTE — Telephone Encounter (Signed)
 Lvm for more information.

## 2023-11-28 NOTE — Telephone Encounter (Signed)
 Copied from CRM [id number]. Topic: General - Other >> Nov 28, 2023 11:39 AM Magdalene School wrote: Reason for CRM: Patient is calling because she is receiving emails and letters in the mail. She would like a call back.

## 2023-12-16 ENCOUNTER — Encounter: Payer: Self-pay | Admitting: Internal Medicine

## 2023-12-16 ENCOUNTER — Ambulatory Visit: Payer: Medicare PPO | Admitting: Internal Medicine

## 2023-12-16 VITALS — BP 136/88 | HR 55 | Ht 64.0 in | Wt 144.8 lb

## 2023-12-16 DIAGNOSIS — E0590 Thyrotoxicosis, unspecified without thyrotoxic crisis or storm: Secondary | ICD-10-CM | POA: Diagnosis not present

## 2023-12-16 DIAGNOSIS — E210 Primary hyperparathyroidism: Secondary | ICD-10-CM | POA: Diagnosis not present

## 2023-12-16 DIAGNOSIS — E042 Nontoxic multinodular goiter: Secondary | ICD-10-CM | POA: Diagnosis not present

## 2023-12-16 DIAGNOSIS — M810 Age-related osteoporosis without current pathological fracture: Secondary | ICD-10-CM

## 2023-12-16 NOTE — Patient Instructions (Signed)
 Stay hydrated Avoid over-the-counter calcium  tablets Make sure you are consuming 2-3 servings of dietary calcium  daily Continue Alendronate  ( Fosamax )  70 mg once weekly

## 2023-12-16 NOTE — Progress Notes (Signed)
 Name: Courtney Russell  MRN/ DOB: [id number], 1945/09/24    Age/ Sex: 78 y.o., female     PCP: Adra Alanis, FNP   Reason for Endocrinology Evaluation:  Primary hyperparathyroidism     Initial Endocrinology Clinic Visit:  06/05/2018    PATIENT IDENTIFIER: Courtney Russell is a 78 y.o., female with a past medical history of hypertension and dyslipidemia. She has followed with [facility] since 06/05/2018 for consultative assistance with management of her hyperparathyroidism  HISTORICAL SUMMARY:  Courtney Russell has been diagnosed with hypercalcemia approximately in 2009, she has been asymptomatic all these years.In review of her records she has had intermittent hypercalcemia since 2015 with a serum calcium  of 10.7 mg/dL (corrected 40.98) She denies any history of HCTZ or lithium use    24-hr urinary excretion of calcium  249.2 in 05/2018,calcium  creatinine ratio of 0.029 which is consistent with primary hyperparathyroidism  DXA - Osteoporosis 06/2018 started on Alendronate    She was lost to follow up from 08/2018 until her return in 04/2021  No family history of hyperparathyroidism, but both parents have history of kidney stones   THYROID  HISTORY:  She was noted with a suppressed TSH at 0.01 uIU/mL during routine labs in 04/2021 .She was started on methimazole  at the time, we stopped it in 11/2021 but had to restart a smaller dose by 01/2022  We attempted to discontinue methimazole  again in 05/2023 but had to be started by 07/2023 due to a TSH of 0.053 uIU/mL    SUBJECTIVE:    Today (12/17/2023):  Courtney Russell is here for a follow-up on hyperparathyroidism, MNG, hyperthyroidism and osteoporosis  Denies polyuria and polydipsia  Has chronic heartburn , continues to improve, controlled with avoiding caffeine  No constipation Denies local neck symptoms  Continues with chronic  rare  palpitations  Denies renal stones  No recent falls    She is on  Vitamin D  5000 iu daily  Alendronate  70 mg once a week  Methimazole  5 mg, 1 tablet 3 days a week ( Monday, Wednesday and Friday)       HISTORY:  Past Medical History:  Past Medical History:  Diagnosis Date   Anxiety    Diabetes mellitus without complication (HCC)    GERD (gastroesophageal reflux disease)    Heart murmur    High cholesterol    Hypertension    IBS (irritable bowel syndrome)    Palpitations    Prediabetes    Past Surgical History:  Past Surgical History:  Procedure Laterality Date   ABDOMINAL HYSTERECTOMY  1992   fibroids   BILATERAL SALPINGOOPHORECTOMY  1996   cysts   BREAST SURGERY     duct excision   CHOLECYSTECTOMY     COLONOSCOPY  1998, 2008   negative;Dr [place] Lax   HERNIA REPAIR     TONSILLECTOMY     TUBAL LIGATION     Social History:  reports that she has never smoked. She has never used smokeless tobacco. She reports that she does not drink alcohol and does not use drugs. Family History: family history includes Alzheimer's disease in her father; Breast cancer in her sister; Cancer in her father; Colon polyps in her mother; Coronary artery disease in her sister; Dementia in her mother; Diabetes in her father and paternal aunt; Heart attack in her father; Stroke in her father; Transient ischemic attack in her mother.   HOME MEDICATIONS: Allergies as of 12/16/2023       Reactions  Dexbromphen-acetaminophen Other (See Comments)   Tachycardia, and leg numbness Tachycardia, and leg numbness   Fish Oil    REACTION: rash REACTION: rash   Codeine Other (See Comments)   Mental status changes Mental status changes   Bisoprolol-hydrochlorothiazide Other (See Comments)   Dropped potassium level,hypokalemia Dropped potassium level,hypokalemia   Crestor  [rosuvastatin ]    Elevated LFTs   Ezetimibe -simvastatin Other (See Comments)   Elevated LFTs Makes liver enzymes too high Elevated LFTs Makes liver enzymes too high   Pravastatin     palpitations     Hydrochlorothiazide    REACTION: low potassium level REACTION: low potassium level   Other Palpitations   dryx oil dryx oil        Medication List        Accurate as of Dec 16, 2023 11:59 PM. If you have any questions, ask your nurse or doctor.          alendronate  70 MG tablet Commonly known as: FOSAMAX  Take 1 tablet (70 mg total) by mouth once a week. Take with a full glass of water on an empty stomach.   ALPRAZolam  0.5 MG tablet Commonly known as: Xanax  Take 1 tablet (0.5 mg total) by mouth daily as needed for anxiety.   amLODipine  5 MG tablet Commonly known as: NORVASC  TAKE 1 TABLET BY MOUTH ONCE DAILY IF  NEEDED FOR  BLOOD  PRESSURE  IS  ABOVE  150/90   ascorbic acid 500 MG tablet Commonly known as: VITAMIN C Take 500 mg by mouth daily.   aspirin 81 MG tablet Take 81 mg by mouth 1 dose over 46 hours.   azelastine  0.1 % nasal spray Commonly known as: ASTELIN  Place 2 sprays into both nostrils 2 (two) times daily. Use in each nostril as directed   cholecalciferol 1000 units tablet Commonly known as: VITAMIN D  Vitamin D    cloNIDine  0.1 MG tablet Commonly known as: CATAPRES  Take 1 tablet (0.1 mg total) by mouth 2 (two) times daily.   diclofenac  Sodium 1 % Gel Commonly known as: VOLTAREN  Apply 4 g topically 4 (four) times daily as needed.   esomeprazole  40 MG capsule Commonly known as: NEXIUM  Take 1 capsule (40 mg total) by mouth daily at 12 noon.   ezetimibe  10 MG tablet Commonly known as: Zetia  Take 1 tablet (10 mg total) by mouth daily.   levocetirizine 5 MG tablet Commonly known as: XYZAL  Take 1 tablet (5 mg total) by mouth every evening.   lisinopril  20 MG tablet Commonly known as: ZESTRIL  Take 1 tablet (20 mg total) by mouth 2 (two) times daily.   methimazole  5 MG tablet Commonly known as: TAPAZOLE  Take 1 tablet (5 mg total) by mouth as directed. 1 tablet 3 days out of the week   metoprolol  tartrate 50 MG tablet Commonly known as:  LOPRESSOR  Take 1 tablet (50 mg total) by mouth 2 (two) times daily.          OBJECTIVE:   PHYSICAL EXAM: VS: BP 136/88 (BP Location: Left Arm, Patient Position: Sitting, Cuff Size: Normal)   Pulse (!) 55   Ht 5\' 4"  (1.626 m)   Wt 144 lb 12.8 oz (65.7 kg)   SpO2 98%   BMI 24.85 kg/m    EXAM: General: Pt appears well and is in NAD Thyroid  : NO nodules appreciated   Lungs: Clear with good BS bilat   Heart: Auscultation: RRR.  Extremities:  BL LE: No pretibial edema  Mental Status: Judgment, insight: Intact Memory: Intact for  recent and remote events Mood and affect: No depression, anxiety, or agitation     DATA REVIEWED:   Latest Reference Range & Units 12/16/23 11:55  Sodium 135 - 146 mmol/L 142  Potassium 3.5 - 5.3 mmol/L 4.3  Chloride 98 - 110 mmol/L 107  CO2 20 - 32 mmol/L 26  Glucose 65 - 99 mg/dL 604 (H)  BUN 7 - 25 mg/dL 11  Creatinine 5.40 - 9.81 mg/dL 1.91  Calcium  8.6 - 10.4 mg/dL 47.8  BUN/Creatinine Ratio 6 - 22 (calc) SEE NOTE:  eGFR > OR = 60 mL/min/1.36m2 71  Magnesium  1.5 - 2.5 mg/dL 2.0  Vitamin D , 25-Hydroxy 30 - 100 ng/mL 39  TSH 0.40 - 4.50 mIU/L 0.60  T4,Free(Direct) 0.8 - 1.8 ng/dL 1.0  Albumin MSPROF 3.6 - 5.1 g/dL 3.8     Latest Reference Range & Units 06/29/21 09:27  TRAB <=2.00 IU/L 3.21 (H)      DXA 01/22/2023 @ [facility]  01/22/2023 Change 2022  AP spine -1.2 N/a  RFN +0.3   Right total hip -1.7 Down 3.0%  LFN  -1.3   Left total hip -1.7 0.0%  Left 1/3rd radium  -4.1 Down 3.0%     Thyroid  Ultrasound 07/03/2023   Estimated total number of nodules >/= 1 cm: 5   Number of spongiform nodules >/=  2 cm not described below (TR1): 0   Number of mixed cystic and solid nodules >/= 1.5 cm not described below (TR2): 0   _________________________________________________________   Nodule # 1:   Prior biopsy: No   Location: Right; Superior   Maximum size: 1.4 cm; Other 2 dimensions: 1.1 x 1.2 cm, previously, 1.5 x 1.4 x 1.1  cm   Composition: solid/almost completely solid (2)   Echogenicity: isoechoic (1)   Shape: not taller-than-wide (0)   Margins: smooth (0)   Echogenic foci: none (0)   ACR TI-RADS total points: 3.   ACR TI-RADS risk category:  TR3 (3 points).   Significant change in size (>/= 20% in two dimensions and minimal increase of 2 mm): No   Change in features: No   Change in ACR TI-RADS risk category: No   ACR TI-RADS recommendations:   Given size (<1.5 cm) and appearance, this nodule does NOT meet TI-RADS criteria for biopsy or dedicated follow-up.   _________________________________________________________   Similar appearance of the previously visualized bilateral, mostly subcentimeter, solid and solid cystic nodules throughout the bilateral thyroid  gland. Each of these nodules again appears benign and does not warrant additional follow-up.   No cervical lymphadenopathy.   IMPRESSION: 1. Similar appearing multinodular goiter. 2. Slight interval decreased size of previously visualized right superior solid thyroid  nodule (labeled 1, 1.4 cm, previously 1.5 cm), now no longer meeting criteria (TI-RADS category 3) for dedicated follow-up.    ASSESSMENT / PLAN / RECOMMENDATIONS:   Hypercalcemia secondary to primary hyperparathyroidism:  -Patient is asymptomatic  -She does qualify for surgical parathyroidectomy due to osteoporosis, patient declined surgical intervention in the past  -24-hour urinary calcium  excretion normal 249.2 Mg 05/2018,  and 204 Mg 05/2022 - Corrected serum calcium  is normal at 10.46 Mg/DL, with normal vitamin D  and GFR - Patient was provided with a printed order for KUB to evaluate any kidney stones  Recommendations Encourage hydration Avoid over-the-counter calcium  tablets  Consume 2-3 servings of dietary calcium  on daily basis Continue vitamin D  -she was not sure if it was 2000 of 5000 units daily    2. Osteoporosis :   -  It is difficult to  asceratin how much of this, is postmenopausal process vs primary hyperparathyroidism -I have attempted to prescribe alendronate  and 2019 but she has not been consistent with this, due to skepticism regarding side effects, patient states that her mother and husband had drop in on Fosamax , we discussed small risk of osteonecrosis -We also had discussed alternative therapy to include Prolia versus Evenity versus zoledronic acid but she opted to retry the alendronate  -  Medication  Continue alendronate  70 mg once a week at this time   3. Subclinical Hyperthyroidism :  - TRAb elevated which indicated active Graves' disease but she also has MNG and could be contributing to her hyperthyroidism  - We have opted opted to treat based on age and osteoporosis  -She was on methimazole  from October 2022 until May 2023 but restarted 01/2022 -An attempt to wean her off methimazole  also failed in 2024 - Repeat TFTs are normal    Medication Continue methimazole  5 mg, 1 tablet 3 days a week    4. Multinodular Goiter :  -No local neck symptoms - Thyroid  ultrasound 05/2021 showed MNG     Follow-up in 6 months     Signed electronically by: Natale Bail, MD   [facility]  [facility] [address] [place], [place] [address] Phone: [phone] FAX: [phone]   CC: Danny Dye, NP [address] [place] [address] Phone: [phone] Fax: [phone]

## 2023-12-17 ENCOUNTER — Ambulatory Visit: Payer: Self-pay | Admitting: Internal Medicine

## 2023-12-17 LAB — BASIC METABOLIC PANEL WITH GFR
BUN: 11 mg/dL (ref 7–25)
CO2: 26 mmol/L (ref 20–32)
Calcium: 10.3 mg/dL (ref 8.6–10.4)
Chloride: 107 mmol/L (ref 98–110)
Creat: 0.85 mg/dL (ref 0.60–1.00)
Glucose, Bld: 146 mg/dL — ABNORMAL HIGH (ref 65–99)
Potassium: 4.3 mmol/L (ref 3.5–5.3)
Sodium: 142 mmol/L (ref 135–146)
eGFR: 71 mL/min/{1.73_m2} (ref >=60)

## 2023-12-17 LAB — MAGNESIUM: Magnesium: 2 mg/dL (ref 1.5–2.5)

## 2023-12-17 LAB — ALBUMIN: Albumin: 3.8 g/dL (ref 3.6–5.1)

## 2023-12-17 LAB — TSH: TSH: 0.6 m[IU]/L (ref 0.40–4.50)

## 2023-12-17 LAB — VITAMIN D 25 HYDROXY (VIT D DEFICIENCY, FRACTURES): Vit D, 25-Hydroxy: 39 ng/mL (ref 30–100)

## 2023-12-17 LAB — PARATHYROID HORMONE, INTACT (NO CA): PTH: 132 pg/mL — ABNORMAL HIGH (ref 16–77)

## 2023-12-17 LAB — T4, FREE: Free T4: 1 ng/dL (ref 0.8–1.8)

## 2023-12-17 LAB — CALCIUM, IONIZED: Calcium, Ion: 5.6 mg/dL — ABNORMAL HIGH (ref 4.7–5.5)

## 2023-12-17 MED ORDER — METHIMAZOLE 5 MG PO TABS: 5.0000 mg | ORAL_TABLET | ORAL | 2 refills | Status: AC

## 2023-12-17 MED ORDER — ALENDRONATE SODIUM 70 MG PO TABS: 70.0000 mg | ORAL_TABLET | ORAL | 3 refills | Status: AC

## 2023-12-20 ENCOUNTER — Ambulatory Visit (HOSPITAL_BASED_OUTPATIENT_CLINIC_OR_DEPARTMENT_OTHER)
Admission: RE | Admit: 2023-12-20 | Discharge: 2023-12-20 | Disposition: A | Discharge status: Home / Self Care | Source: Ambulatory Visit | Attending: Internal Medicine | Admitting: Internal Medicine

## 2023-12-20 DIAGNOSIS — E213 Hyperparathyroidism, unspecified: Secondary | ICD-10-CM | POA: Diagnosis not present

## 2023-12-20 DIAGNOSIS — E210 Primary hyperparathyroidism: Secondary | ICD-10-CM | POA: Diagnosis not present

## 2024-01-08 DIAGNOSIS — R300 Dysuria: Secondary | ICD-10-CM | POA: Diagnosis not present

## 2024-01-08 DIAGNOSIS — I10 Essential (primary) hypertension: Secondary | ICD-10-CM | POA: Diagnosis not present

## 2024-01-08 DIAGNOSIS — N390 Urinary tract infection, site not specified: Secondary | ICD-10-CM | POA: Diagnosis not present

## 2024-02-17 DIAGNOSIS — Z1231 Encounter for screening mammogram for malignant neoplasm of breast: Secondary | ICD-10-CM | POA: Diagnosis not present

## 2024-02-17 LAB — HM MAMMOGRAPHY

## 2024-02-27 ENCOUNTER — Encounter: Payer: Self-pay | Admitting: Family

## 2024-04-14 ENCOUNTER — Other Ambulatory Visit: Payer: Self-pay | Admitting: Family

## 2024-04-14 NOTE — Telephone Encounter (Signed)
 Copied from CRM [id number]. Topic: Clinical - Medication Question >> Apr 14, 2024  1:06 PM Tysheama G wrote: Reason for CRM: Patient stated Dr.Murray gives her ALPRAZolam  (XANAX ) 0.5 MG tablet when she sees her but since Dr.Murray is leaving she wants to know if she can send it in for her before she leaves. Callback number [phone]

## 2024-04-16 MED ORDER — ALPRAZOLAM 0.5 MG PO TABS: 0.5000 mg | ORAL_TABLET | Freq: Every day | ORAL | 0 refills | Status: AC | PRN

## 2024-04-30 ENCOUNTER — Telehealth: Payer: Self-pay | Admitting: Family

## 2024-04-30 DIAGNOSIS — I10 Essential (primary) hypertension: Secondary | ICD-10-CM

## 2024-04-30 DIAGNOSIS — E782 Mixed hyperlipidemia: Secondary | ICD-10-CM

## 2024-04-30 NOTE — Telephone Encounter (Signed)
 Pt scheduled a toc with taylor beck on 07/28/24 and is going to need a medication refill towards the end of October, beginning of November, and was wondering if her medications could be refilled before her appt date. Please call pt if when medications have been sent in.

## 2024-04-30 NOTE — Telephone Encounter (Signed)
 Did she specify which medications she is needing refilled?

## 2024-05-01 MED ORDER — CLONIDINE HCL 0.1 MG PO TABS: 0.1000 mg | ORAL_TABLET | Freq: Two times a day (BID) | ORAL | 0 refills | Status: AC

## 2024-05-01 MED ORDER — LISINOPRIL 20 MG PO TABS: 20.0000 mg | ORAL_TABLET | Freq: Two times a day (BID) | ORAL | 0 refills | Status: AC

## 2024-05-01 MED ORDER — EZETIMIBE 10 MG PO TABS: 10.0000 mg | ORAL_TABLET | Freq: Every day | ORAL | 0 refills | Status: AC

## 2024-05-01 MED ORDER — METOPROLOL TARTRATE 50 MG PO TABS: 50.0000 mg | ORAL_TABLET | Freq: Two times a day (BID) | ORAL | 0 refills | Status: AC

## 2024-05-01 NOTE — Telephone Encounter (Signed)
 Called and confirmed which medications were needed. Sent a 90 day supply to the pharmacy. Patient to keep follow up appt.

## 2024-06-02 ENCOUNTER — Encounter: Payer: Medicare PPO | Admitting: Family

## 2024-06-12 NOTE — Progress Notes (Unsigned)
 Name: Courtney Russell  MRN/ DOB: [id number], Dec 23, 1945    Age/ Sex: 78 y.o., female     PCP: Jason Leita Repine, FNP (Inactive)   Reason for Endocrinology Evaluation:  Primary hyperparathyroidism     Initial Endocrinology Clinic Visit:  06/05/2018    PATIENT IDENTIFIER: Courtney Russell is a 78 y.o., female with a past medical history of hypertension and dyslipidemia. She has followed with [facility] since 06/05/2018 for consultative assistance with management of her hyperparathyroidism  HISTORICAL SUMMARY:  [name] has been diagnosed with hypercalcemia approximately in 2009, she has been asymptomatic all these years.In review of her records she has had intermittent hypercalcemia since 2015 with a serum calcium  of 10.7 mg/dL (corrected 89.13) She denies any history of HCTZ or lithium use    24-hr urinary excretion of calcium  249.2 in 05/2018,calcium  creatinine ratio of 0.029 which is consistent with primary hyperparathyroidism  DXA - Osteoporosis 06/2018 started on Alendronate    She was lost to follow up from 08/2018 until her return in 04/2021  No family history of hyperparathyroidism, but both parents have history of kidney stones   THYROID  HISTORY:  She was noted with a suppressed TSH at 0.01 uIU/mL during routine labs in 04/2021 .She was started on methimazole  at the time, we stopped it in 11/2021 but had to restart a smaller dose by 01/2022  We attempted to discontinue methimazole  again in 05/2023 but had to be started by 07/2023 due to a TSH of 0.053 uIU/mL    SUBJECTIVE:    Today (06/15/2024):  [name] is here for a follow-up on hyperparathyroidism, MNG, hyperthyroidism and osteoporosis  No polydipsia  No polyuria  No constipation  No renal stones  No recent falls  She consumes 3 glasses a day  Spouse has COPD and PTSD which has been stressful  Rare palpitations     She is on Vitamin D  5000 iu daily  Alendronate  70 mg  once a week  Methimazole  5 mg, 1 tablet 3 days a week ( Monday, Wednesday and Friday)    HISTORY:  Past Medical History:  Past Medical History:  Diagnosis Date   Anxiety    Diabetes mellitus without complication (HCC)    GERD (gastroesophageal reflux disease)    Heart murmur    High cholesterol    Hypertension    IBS (irritable bowel syndrome)    Palpitations    Prediabetes    Past Surgical History:  Past Surgical History:  Procedure Laterality Date   ABDOMINAL HYSTERECTOMY  1992   fibroids   BILATERAL SALPINGOOPHORECTOMY  1996   cysts   BREAST SURGERY     duct excision   CHOLECYSTECTOMY     COLONOSCOPY  1998, 2008   negative;Dr Obie   HERNIA REPAIR     TONSILLECTOMY     TUBAL LIGATION     Social History:  reports that she has never smoked. She has never used smokeless tobacco. She reports that she does not drink alcohol and does not use drugs. Family History: family history includes Alzheimer's disease in her father; Breast cancer in her sister; Cancer in her father; Colon polyps in her mother; Coronary artery disease in her sister; Dementia in her mother; Diabetes in her father and paternal aunt; Heart attack in her father; Stroke in her father; Transient ischemic attack in her mother.   HOME MEDICATIONS: Allergies as of 06/15/2024       Reactions   Dexbromphen-acetaminophen Other (See  Comments)   Tachycardia, and leg numbness Tachycardia, and leg numbness   Fish Oil    REACTION: rash REACTION: rash   Codeine Other (See Comments)   Mental status changes Mental status changes   Bisoprolol-hydrochlorothiazide Other (See Comments)   Dropped potassium level,hypokalemia Dropped potassium level,hypokalemia   Crestor  [rosuvastatin ]    Elevated LFTs   Ezetimibe -simvastatin Other (See Comments)   Elevated LFTs Makes liver enzymes too high Elevated LFTs Makes liver enzymes too high   Pravastatin     palpitations    Hydrochlorothiazide    REACTION: low  potassium level REACTION: low potassium level   Other Palpitations   dryx oil dryx oil        Medication List        Accurate as of June 15, 2024 11:22 AM. If you have any questions, ask your nurse or doctor.          alendronate  70 MG tablet Commonly known as: FOSAMAX  Take 1 tablet (70 mg total) by mouth once a week. Take with a full glass of water on an empty stomach.   ALPRAZolam  0.5 MG tablet Commonly known as: Xanax  Take 1 tablet (0.5 mg total) by mouth daily as needed for anxiety.   amLODipine  5 MG tablet Commonly known as: NORVASC  TAKE 1 TABLET BY MOUTH ONCE DAILY IF  NEEDED FOR  BLOOD  PRESSURE  IS  ABOVE  150/90   ascorbic acid 500 MG tablet Commonly known as: VITAMIN C Take 500 mg by mouth daily.   aspirin 81 MG tablet Take 81 mg by mouth 1 dose over 46 hours.   azelastine  0.1 % nasal spray Commonly known as: ASTELIN  Place 2 sprays into both nostrils 2 (two) times daily. Use in each nostril as directed   cholecalciferol 1000 units tablet Commonly known as: VITAMIN D  Vitamin D    cloNIDine  0.1 MG tablet Commonly known as: CATAPRES  Take 1 tablet (0.1 mg total) by mouth 2 (two) times daily.   diclofenac  Sodium 1 % Gel Commonly known as: VOLTAREN  Apply 4 g topically 4 (four) times daily as needed.   esomeprazole  40 MG capsule Commonly known as: NEXIUM  Take 1 capsule (40 mg total) by mouth daily at 12 noon.   ezetimibe  10 MG tablet Commonly known as: Zetia  Take 1 tablet (10 mg total) by mouth daily.   levocetirizine 5 MG tablet Commonly known as: XYZAL  Take 1 tablet (5 mg total) by mouth every evening.   lisinopril  20 MG tablet Commonly known as: ZESTRIL  Take 1 tablet (20 mg total) by mouth 2 (two) times daily.   methimazole  5 MG tablet Commonly known as: TAPAZOLE  Take 1 tablet (5 mg total) by mouth as directed. 1 tablet 3 days out of the week   metoprolol  tartrate 50 MG tablet Commonly known as: LOPRESSOR  Take 1 tablet (50 mg total)  by mouth 2 (two) times daily.          OBJECTIVE:   PHYSICAL EXAM: VS: There were no vitals taken for this visit.   EXAM: General: Pt appears well and is in NAD Thyroid  : NO nodules appreciated   Lungs: Clear with good BS bilat   Heart: Auscultation: RRR.  Extremities:  BL LE: No pretibial edema  Mental Status: Judgment, insight: Intact Memory: Intact for recent and remote events Mood and affect: No depression, anxiety, or agitation     DATA REVIEWED:   Latest Reference Range & Units 06/15/24 12:13  Sodium 135 - 146 mmol/L 140  Potassium 3.5 -  5.3 mmol/L 4.4  Chloride 98 - 110 mmol/L 103  CO2 20 - 32 mmol/L 28  Glucose 65 - 99 mg/dL 885 (H)  BUN 7 - 25 mg/dL 12  Creatinine 9.39 - 8.99 mg/dL 9.09  Calcium  8.6 - 10.4 mg/dL 89.4 (H)  BUN/Creatinine Ratio 6 - 22 (calc) SEE NOTE:  eGFR > OR = 60 mL/min/1.55m2 66     Latest Reference Range & Units 06/15/24 12:13  PTH, Intact 16 - 77 pg/mL 115 (H)  TSH 0.40 - 4.50 mIU/L 0.75  T4,Free(Direct) 0.8 - 1.8 ng/dL 1.0     Latest Reference Range & Units 06/29/21 09:27  TRAB <=2.00 IU/L 3.21 (H)      DXA 01/22/2023 @ [facility]  01/22/2023 Change 2022  AP spine -1.2 N/a  RFN +0.3   Right total hip -1.7 Down 3.0%  LFN  -1.3   Left total hip -1.7 0.0%  Left 1/3rd radium  -4.1 Down 3.0%   KUB 12/20/2023  No renal calculi.   Two calcifications overlying the left transverse process at L5, measuring up to 9 mm. Ureteral calculi are possible but not favored.   Thyroid  Ultrasound 07/03/2023   Estimated total number of nodules >/= 1 cm: 5   Number of spongiform nodules >/=  2 cm not described below (TR1): 0   Number of mixed cystic and solid nodules >/= 1.5 cm not described below (TR2): 0   _________________________________________________________   Nodule # 1:   Prior biopsy: No   Location: Right; Superior   Maximum size: 1.4 cm; Other 2 dimensions: 1.1 x 1.2 cm, previously, 1.5 x 1.4 x 1.1 cm   Composition:  solid/almost completely solid (2)   Echogenicity: isoechoic (1)   Shape: not taller-than-wide (0)   Margins: smooth (0)   Echogenic foci: none (0)   ACR TI-RADS total points: 3.   ACR TI-RADS risk category:  TR3 (3 points).   Significant change in size (>/= 20% in two dimensions and minimal increase of 2 mm): No   Change in features: No   Change in ACR TI-RADS risk category: No   ACR TI-RADS recommendations:   Given size (<1.5 cm) and appearance, this nodule does NOT meet TI-RADS criteria for biopsy or dedicated follow-up.   _________________________________________________________   Similar appearance of the previously visualized bilateral, mostly subcentimeter, solid and solid cystic nodules throughout the bilateral thyroid  gland. Each of these nodules again appears benign and does not warrant additional follow-up.   No cervical lymphadenopathy.   IMPRESSION: 1. Similar appearing multinodular goiter. 2. Slight interval decreased size of previously visualized right superior solid thyroid  nodule (labeled 1, 1.4 cm, previously 1.5 cm), now no longer meeting criteria (TI-RADS category 3) for dedicated follow-up.    ASSESSMENT / PLAN / RECOMMENDATIONS:   Hypercalcemia secondary to primary hyperparathyroidism:  -Patient is asymptomatic  -She does qualify for surgical parathyroidectomy due to osteoporosis, patient declined surgical intervention in the past  -24-hour urinary calcium  excretion normal 249.2 Mg 05/2018,  and 204 Mg 05/2022 -KUB showed questionable ureteral calculi - GFR, and vitamin D  are normal.  PTH and serum calcium  elevated  Recommendations Encourage hydration Avoid over-the-counter calcium  tablets  Consume 2-3 servings of dietary calcium  on daily basis Continue vitamin D  -she was not sure if it was 2000 of 5000 units daily    2. Osteoporosis :   - It is difficult to asceratin how much of this, is postmenopausal process vs primary  hyperparathyroidism -I have attempted to prescribe alendronate  and 2019  but she has not been consistent with this, due to skepticism regarding side effects, patient states that her mother and husband had drop in on Fosamax , we discussed small risk of osteonecrosis -We also had discussed alternative therapy to include Prolia versus Evenity versus zoledronic acid but she opted to retry the alendronate  -  Medication  Continue alendronate  70 mg once a week at this time   3. Subclinical Hyperthyroidism :  - TRAb elevated which indicated active Graves' disease but she also has MNG and could be contributing to her hyperthyroidism  - We have opted opted to treat based on age and osteoporosis  -She was on methimazole  from October 2022 until May 2023 but restarted 01/2022 -An attempt to wean her off methimazole  also failed in 2024 - Repeat TFTs remain within normal range, no change   Medication Continue methimazole  5 mg, 1 tablet 3 days a week    4. Multinodular Goiter :  -No local neck symptoms - Thyroid  ultrasound 05/2021 showed MNG   5.HTN:  -Patient asymptomatic - BP at home is within normal range most of the time - Patient to continue antihypertensive medications   Follow-up in 6 months     Signed electronically by: Stefano Redgie Butts, MD   [facility]  [facility] [address] [place] [place], [place] [address] Phone: [phone] FAX: [phone]   CC: Lenon Romualdo SAILOR, NP [address] [place] [address] Phone: [phone] Fax: [phone]

## 2024-06-15 ENCOUNTER — Other Ambulatory Visit

## 2024-06-15 ENCOUNTER — Ambulatory Visit: Admitting: Internal Medicine

## 2024-06-15 ENCOUNTER — Encounter: Payer: Self-pay | Admitting: Internal Medicine

## 2024-06-15 VITALS — BP 160/80 | HR 66 | Ht 64.0 in | Wt 146.0 lb

## 2024-06-15 DIAGNOSIS — E210 Primary hyperparathyroidism: Secondary | ICD-10-CM | POA: Diagnosis not present

## 2024-06-15 DIAGNOSIS — E0590 Thyrotoxicosis, unspecified without thyrotoxic crisis or storm: Secondary | ICD-10-CM

## 2024-06-15 DIAGNOSIS — M810 Age-related osteoporosis without current pathological fracture: Secondary | ICD-10-CM | POA: Diagnosis not present

## 2024-06-15 DIAGNOSIS — E042 Nontoxic multinodular goiter: Secondary | ICD-10-CM

## 2024-06-15 NOTE — Patient Instructions (Signed)
 Stay hydrated Avoid over-the-counter calcium  tablets Make sure you are consuming 2-3 servings of dietary calcium  daily

## 2024-06-16 LAB — T4, FREE: Free T4: 1 ng/dL (ref 0.8–1.8)

## 2024-06-16 LAB — BASIC METABOLIC PANEL WITH GFR
BUN: 12 mg/dL (ref 7–25)
CO2: 28 mmol/L (ref 20–32)
Calcium: 10.5 mg/dL — ABNORMAL HIGH (ref 8.6–10.4)
Chloride: 103 mmol/L (ref 98–110)
Creat: 0.9 mg/dL (ref 0.60–1.00)
Glucose, Bld: 114 mg/dL — ABNORMAL HIGH (ref 65–99)
Potassium: 4.4 mmol/L (ref 3.5–5.3)
Sodium: 140 mmol/L (ref 135–146)
eGFR: 66 mL/min/1.73m2 (ref >=60)

## 2024-06-16 LAB — VITAMIN D 25 HYDROXY (VIT D DEFICIENCY, FRACTURES): Vit D, 25-Hydroxy: 42 ng/mL (ref 30–100)

## 2024-06-16 LAB — TSH: TSH: 0.75 m[IU]/L (ref 0.40–4.50)

## 2024-06-16 LAB — PARATHYROID HORMONE, INTACT (NO CA): PTH: 115 pg/mL — ABNORMAL HIGH (ref 16–77)

## 2024-06-17 ENCOUNTER — Ambulatory Visit: Payer: Self-pay | Admitting: Internal Medicine

## 2024-07-26 NOTE — Progress Notes (Signed)
 "   New Patient Office Visit   Subjective     Patient ID: Courtney Russell, female   DOB: Mar 09, 1946  Age: 78 y.o. MRN: [id number]   CC:  No chief complaint on file.     HPI TAYLLOR BREITENSTEIN presents to establish care.      Hypertension: - Medications: Amlodipine  5 mg daily, Lisinopril  20 mg BID, and Metoprolol  tartrate 50 mg BID, and Clonidine  BID.  - Compliance: *** - Checking BP at home: *** - Denies any SOB, recurrent headaches, CP, vision changes, LE edema, dizziness, palpitations, or medication side effects. - Diet: *** - Exercise: *** - Stressors:   Hyperlipidemia: - medications: Zetia  10 mg daily.  - compliance: *** - medication SEs: *** The 10-year ASCVD risk score (Arnett DK, et al., 2019) is: 55%   Values used to calculate the score:     Age: 82 years     Clinically relevant sex: Female     Is Non-Hispanic African American: No     Diabetic: Yes     Tobacco smoker: No     Systolic Blood Pressure: 160 mmHg     Is BP treated: Yes     HDL Cholesterol: 50.6 mg/dL     Total Cholesterol: 192 mg/dL   Diabetes: - Checking glucose at home: *** - Medications: none - Compliance: n/a - Diet: *** - Exercise: *** - Eye exam: UTD through [facility]. - Foot exam: Due - Microalbumin: Due - Denies symptoms of hypoglycemia, polyuria, polydipsia, numbness extremities, foot ulcers/trauma, wounds that are not healing, medication side effects  Lab Results  Component Value Date   HGBA1C 6.6 (H) 05/31/2023     Hyperthyroidism: - Management: Methimazole  5 mg 3 times per week.  -No recent changes to hair, skin, nails, energy levels Lab Results  Component Value Date   TSH 0.75 06/15/2024    Osteoporosis: - Management: Fosamax  70 mg weekly.  - 01/22/2023 DEXA with T-score of -4.1.   Anxiety: - Treatment: Xanax  0.5 mg as needed.  - Medication side effects:  - SI/HI:  - Update:     11/26/2023    2:42 PM 05/31/2023    8:41 AM 11/08/2022   10:22 AM 05/25/2022     8:19 AM 11/02/2021   11:21 AM  Depression screen PHQ 2/9  Decreased Interest 0 0 0 0 0  Down, Depressed, Hopeless 0 0 0 0 0  PHQ - 2 Score 0 0 0 0 0       No data to display              Show/hide medication list[1] Past Medical History:  Diagnosis Date   Anxiety    Diabetes mellitus without complication (HCC)    GERD (gastroesophageal reflux disease)    Heart murmur    High cholesterol    Hypertension    IBS (irritable bowel syndrome)    Palpitations    Prediabetes     Past Surgical History:  Procedure Laterality Date   ABDOMINAL HYSTERECTOMY  1992   fibroids   BILATERAL SALPINGOOPHORECTOMY  1996   cysts   BREAST SURGERY     duct excision   CHOLECYSTECTOMY     COLONOSCOPY  1998, 2008   negative;Dr Obie   HERNIA REPAIR     TONSILLECTOMY     TUBAL LIGATION       Family History  Problem Relation Age of Onset   Cancer Father        COLON &  PROSTATE   Diabetes Father    Alzheimer's disease Father    Heart attack Father        MI in late 19s; CABG @ 70   Stroke Father        in 43s   Dementia Mother    Transient ischemic attack Mother        TIAs; AVM leaked in 35s   Colon polyps Mother    Coronary artery disease Sister        stent @ 49   Breast cancer Sister    Diabetes Paternal Aunt     Social History   Socioeconomic History   Marital status: Married    Spouse name: Not on file   Number of children: 3   Years of education: 13   Highest education level: Not on file  Occupational History   Occupation: Retired  Tobacco Use   Smoking status: Never   Smokeless tobacco: Never  Substance and Sexual Activity   Alcohol use: No   Drug use: No   Sexual activity: Not on file  Other Topics Concern   Not on file  Social History Narrative   Fun: Travel and read   Denies abuse and feels safe at home.    Social Drivers of Health   Tobacco Use: Low Risk (06/15/2024)   Patient History    Smoking Tobacco Use: Never    Smokeless Tobacco  Use: Never    Passive Exposure: Not on file  Financial Resource Strain: Low Risk (11/26/2023)   Overall Financial Resource Strain (CARDIA)    Difficulty of Paying Living Expenses: Not hard at all  Food Insecurity: No Food Insecurity (11/26/2023)   Hunger Vital Sign    Worried About Running Out of Food in the Last Year: Never true    Ran Out of Food in the Last Year: Never true  Transportation Needs: No Transportation Needs (11/26/2023)   PRAPARE - Administrator, Civil Service (Medical): No    Lack of Transportation (Non-Medical): No  Physical Activity: Unknown (11/26/2023)   Exercise Vital Sign    Days of Exercise per Week: 2 days    Minutes of Exercise per Session: Not on file  Stress: Stress Concern Present (11/26/2023)   [employer] of Occupational Health - Occupational Stress Questionnaire    Feeling of Stress : To some extent  Social Connections: Unknown (11/26/2023)   Social Connection and Isolation Panel    Frequency of Communication with Friends and Family: More than three times a week    Frequency of Social Gatherings with Friends and Family: More than three times a week    Attends Religious Services: 1 to 4 times per year    Active Member of [employer] or Organizations: No    Attends Banker Meetings: Not on file    Marital Status: Not on file  Depression (PHQ2-9): Low Risk (11/26/2023)   Depression (PHQ2-9)    PHQ-2 Score: 0  Alcohol Screen: Low Risk (11/20/2023)   Alcohol Screen    Last Alcohol Screening Score (AUDIT): 0  Housing: Unknown (11/26/2023)   Housing Stability Vital Sign    Unable to Pay for Housing in the Last Year: No    Number of Times Moved in the Last Year: Not on file    Homeless in the Last Year: No  Utilities: Not At Risk (11/26/2023)   AHC Utilities    Threatened with loss of utilities: No  Health Literacy: Not on file  ROS All review of systems negative except what is listed in the HPI    Objective     There were  no vitals taken for this visit.  Physical Exam     Assessment & Plan:     Problem List Items Addressed This Visit   None            No follow-ups on file.  Waddell KATHEE Mon, NP  I,Emily Lagle,acting as a scribe for Waddell KATHEE Mon, NP.,have documented all relevant documentation on the behalf of Waddell KATHEE Mon, NP.  I, Waddell KATHEE Mon, NP, have reviewed all documentation for this visit. The documentation on 07/28/2024 for the exam, diagnosis, procedures, and orders are all accurate and complete.    [1]  Outpatient Medications Prior to Visit  Medication Sig   alendronate  (FOSAMAX ) 70 MG tablet Take 1 tablet (70 mg total) by mouth once a week. Take with a full glass of water on an empty stomach.   ALPRAZolam  (XANAX ) 0.5 MG tablet Take 1 tablet (0.5 mg total) by mouth daily as needed for anxiety.   amLODipine  (NORVASC ) 5 MG tablet TAKE 1 TABLET BY MOUTH ONCE DAILY IF  NEEDED FOR  BLOOD  PRESSURE  IS  ABOVE  150/90   aspirin 81 MG tablet Take 81 mg by mouth 1 dose over 46 hours.     azelastine  (ASTELIN ) 0.1 % nasal spray Place 2 sprays into both nostrils 2 (two) times daily. Use in each nostril as directed   cholecalciferol (VITAMIN D ) 1000 units tablet Vitamin D    cloNIDine  (CATAPRES ) 0.1 MG tablet Take 1 tablet (0.1 mg total) by mouth 2 (two) times daily.   diclofenac  Sodium (VOLTAREN ) 1 % GEL Apply 4 g topically 4 (four) times daily as needed.   esomeprazole  (NEXIUM ) 40 MG capsule Take 1 capsule (40 mg total) by mouth daily at 12 noon.   ezetimibe  (ZETIA ) 10 MG tablet Take 1 tablet (10 mg total) by mouth daily.   levocetirizine (XYZAL ) 5 MG tablet Take 1 tablet (5 mg total) by mouth every evening.   lisinopril  (ZESTRIL ) 20 MG tablet Take 1 tablet (20 mg total) by mouth 2 (two) times daily.   methimazole  (TAPAZOLE ) 5 MG tablet Take 1 tablet (5 mg total) by mouth as directed. 1 tablet 3 days out of the week   metoprolol  tartrate (LOPRESSOR ) 50 MG tablet Take 1 tablet (50 mg total) by  mouth 2 (two) times daily.   vitamin C (ASCORBIC ACID) 500 MG tablet Take 500 mg by mouth daily.   No facility-administered medications prior to visit.   "

## 2024-07-28 ENCOUNTER — Encounter: Payer: Self-pay | Admitting: Family Medicine

## 2024-07-28 ENCOUNTER — Ambulatory Visit: Admitting: Family Medicine

## 2024-07-28 VITALS — BP 149/59 | HR 55 | Ht 64.0 in | Wt 145.0 lb

## 2024-07-28 DIAGNOSIS — E782 Mixed hyperlipidemia: Secondary | ICD-10-CM | POA: Diagnosis not present

## 2024-07-28 DIAGNOSIS — K219 Gastro-esophageal reflux disease without esophagitis: Secondary | ICD-10-CM | POA: Diagnosis not present

## 2024-07-28 DIAGNOSIS — E119 Type 2 diabetes mellitus without complications: Secondary | ICD-10-CM | POA: Diagnosis not present

## 2024-07-28 DIAGNOSIS — Z0000 Encounter for general adult medical examination without abnormal findings: Secondary | ICD-10-CM | POA: Diagnosis not present

## 2024-07-28 DIAGNOSIS — Z23 Encounter for immunization: Secondary | ICD-10-CM

## 2024-07-28 DIAGNOSIS — I10 Essential (primary) hypertension: Secondary | ICD-10-CM

## 2024-07-28 LAB — HEMOGLOBIN A1C: Hgb A1c MFr Bld: 6.9 % — ABNORMAL HIGH (ref 4.6–6.5)

## 2024-07-28 LAB — MICROALBUMIN / CREATININE URINE RATIO
Creatinine,U: 73.6 mg/dL
Microalb Creat Ratio: 14.4 mg/g (ref 0.0–30.0)
Microalb, Ur: 1.1 mg/dL (ref 0.7–1.9)

## 2024-07-28 NOTE — Assessment & Plan Note (Signed)
 Blood pressure is not at goal for age and co-morbidities, but home readings are much better.  Recommendations: continue amlodipine  5 mg daily, lisinopril  20 mg BID, metoprolol  50 mg BID, clonidine  0.1 mg BID - BP goal <130/80 - monitor and log blood pressures at home - check around the same time each day in a relaxed setting - Limit salt to <2000 mg/day - Follow DASH eating plan (heart healthy diet) - limit alcohol to 2 standard drinks per day for men and 1 per day for women - avoid tobacco products - get at least 2 hours of regular aerobic exercise weekly Patient aware of signs/symptoms requiring further/urgent evaluation.

## 2024-07-28 NOTE — Assessment & Plan Note (Signed)
 Medication management: Zetia  10 mg daily Lifestyle factors for lowering cholesterol include: Diet therapy - heart-healthy diet rich in fruits, veggies, fiber-rich whole grains, lean meats, chicken, fish (at least twice a week), fat-free or 1% dairy products; foods low in saturated/trans fats, cholesterol, sodium, and sugar. Mediterranean diet has shown to be very heart healthy. Regular exercise - recommend at least 30 minutes a day, 5 times per week Weight management

## 2024-07-28 NOTE — Assessment & Plan Note (Signed)
 Stable

## 2024-07-28 NOTE — Assessment & Plan Note (Signed)
 Well controlled with last A1c 6.6%. Recheck today.  Continue lifestyle management Discussed diet and exercise F/u in 3 months

## 2024-07-29 ENCOUNTER — Ambulatory Visit: Payer: Self-pay | Admitting: Family Medicine

## 2024-10-26 ENCOUNTER — Ambulatory Visit: Admitting: Family Medicine

## 2024-10-27 ENCOUNTER — Ambulatory Visit: Admitting: Family Medicine

## 2024-12-01 ENCOUNTER — Ambulatory Visit

## 2024-12-14 ENCOUNTER — Ambulatory Visit: Admitting: Internal Medicine
